# Patient Record
Sex: Male | Born: 1945 | Race: White | Hispanic: No | Marital: Married | Smoking: Never smoker
Health system: Southern US, Community
[De-identification: ages and names within clinical notes are randomized; demographics above are authoritative.]

## PROBLEM LIST (undated history)

## (undated) DIAGNOSIS — I10 Essential (primary) hypertension: Secondary | ICD-10-CM

## (undated) DIAGNOSIS — E119 Type 2 diabetes mellitus without complications: Secondary | ICD-10-CM

## (undated) DIAGNOSIS — E162 Hypoglycemia, unspecified: Secondary | ICD-10-CM

## (undated) DIAGNOSIS — M48061 Spinal stenosis, lumbar region without neurogenic claudication: Secondary | ICD-10-CM

## (undated) DIAGNOSIS — M4802 Spinal stenosis, cervical region: Secondary | ICD-10-CM

## (undated) DIAGNOSIS — Z87442 Personal history of urinary calculi: Secondary | ICD-10-CM

## (undated) DIAGNOSIS — G250 Essential tremor: Secondary | ICD-10-CM

## (undated) DIAGNOSIS — M66261 Spontaneous rupture of extensor tendons, right lower leg: Secondary | ICD-10-CM

## (undated) DIAGNOSIS — M1990 Unspecified osteoarthritis, unspecified site: Secondary | ICD-10-CM

## (undated) DIAGNOSIS — Z8719 Personal history of other diseases of the digestive system: Secondary | ICD-10-CM

## (undated) DIAGNOSIS — K219 Gastro-esophageal reflux disease without esophagitis: Secondary | ICD-10-CM

## (undated) DIAGNOSIS — G4730 Sleep apnea, unspecified: Secondary | ICD-10-CM

## (undated) HISTORY — PX: VENA CAVA FILTER PLACEMENT: SUR1032

## (undated) HISTORY — PX: SKIN SURGERY: SHX2413

## (undated) HISTORY — PX: GASTRIC BYPASS: SHX52

## (undated) HISTORY — PX: FRACTURE SURGERY: SHX138

---
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
## 2003-11-04 DIAGNOSIS — R0601 Orthopnea: Secondary | ICD-10-CM | POA: Insufficient documentation

## 2003-11-04 DIAGNOSIS — E66813 Obesity, class 3: Secondary | ICD-10-CM | POA: Insufficient documentation

## 2003-11-04 DIAGNOSIS — K279 Peptic ulcer, site unspecified, unspecified as acute or chronic, without hemorrhage or perforation: Secondary | ICD-10-CM | POA: Insufficient documentation

## 2012-03-12 DIAGNOSIS — F419 Anxiety disorder, unspecified: Secondary | ICD-10-CM | POA: Insufficient documentation

## 2012-06-11 DIAGNOSIS — N200 Calculus of kidney: Secondary | ICD-10-CM | POA: Insufficient documentation

## 2012-12-17 DIAGNOSIS — M5126 Other intervertebral disc displacement, lumbar region: Secondary | ICD-10-CM | POA: Insufficient documentation

## 2014-04-14 DIAGNOSIS — M9951 Intervertebral disc stenosis of neural canal of cervical region: Secondary | ICD-10-CM | POA: Insufficient documentation

## 2014-04-14 DIAGNOSIS — M488X9 Other specified spondylopathies, site unspecified: Secondary | ICD-10-CM | POA: Insufficient documentation

## 2018-09-29 DIAGNOSIS — K909 Intestinal malabsorption, unspecified: Secondary | ICD-10-CM | POA: Insufficient documentation

## 2019-02-25 NOTE — Discharge Instructions (Signed)
°  Instructions after Total Knee Replacement ° ° Heleena Miceli P. Domenik Trice, Jr., M.D.    ° Dept. of Orthopaedics & Sports Medicine ° [facility] ° [address] ° [place], [place]  [address] ° Phone: [phone]   Fax: [phone] ° °  °DIET: °• Drink plenty of non-alcoholic fluids. °• Resume your normal diet. Include foods high in fiber. ° °ACTIVITY:  °• You may use crutches or a walker with weight-bearing as tolerated, unless instructed otherwise. °• You may be weaned off of the walker or crutches by your Physical Therapist.  °• Do NOT place pillows under the knee. Anything placed under the knee could limit your ability to straighten the knee.   °• Continue doing gentle exercises. Exercising will reduce the pain and swelling, increase motion, and prevent muscle weakness.   °• Please continue to use the TED compression stockings for 6 weeks. You may remove the stockings at night, but should reapply them in the morning. °• Do not drive or operate any equipment until instructed. ° °WOUND CARE:  °• Continue to use the PolarCare or ice packs periodically to reduce pain and swelling. °• You may bathe or shower after the staples are removed at the first office visit following surgery. ° °MEDICATIONS: °• You may resume your regular medications. °• Please take the pain medication as prescribed on the medication. °• Do not take pain medication on an empty stomach. °• You have been given a prescription for a blood thinner (Lovenox or Coumadin). Please take the medication as instructed. (NOTE: After completing a 2 week course of Lovenox, take one Enteric-coated aspirin once a day. This along with elevation will help reduce the possibility of phlebitis in your operated leg.) °• Do not drive or drink alcoholic beverages when taking pain medications. ° °CALL THE OFFICE FOR: °• Temperature above 101 degrees °• Excessive bleeding or drainage on the dressing. °• Excessive swelling, coldness, or paleness of the toes. °• Persistent  nausea and vomiting. ° °FOLLOW-UP:  °• You should have an appointment to return to the office in 10-14 days after surgery. °• Arrangements have been made for continuation of Physical Therapy (either home therapy or outpatient therapy). °  °

## 2019-02-26 ENCOUNTER — Other Ambulatory Visit: Payer: Self-pay

## 2019-02-26 ENCOUNTER — Encounter
Admission: RE | Admit: 2019-02-26 | Discharge: 2019-02-26 | Disposition: A | Discharge status: Home / Self Care | Payer: Medicare Other | Source: Ambulatory Visit | Attending: Orthopedic Surgery | Admitting: Orthopedic Surgery

## 2019-02-26 DIAGNOSIS — I10 Essential (primary) hypertension: Secondary | ICD-10-CM | POA: Diagnosis not present

## 2019-02-26 DIAGNOSIS — G4730 Sleep apnea, unspecified: Secondary | ICD-10-CM | POA: Diagnosis not present

## 2019-02-26 DIAGNOSIS — Z20828 Contact with and (suspected) exposure to other viral communicable diseases: Secondary | ICD-10-CM | POA: Insufficient documentation

## 2019-02-26 DIAGNOSIS — Z01818 Encounter for other preprocedural examination: Secondary | ICD-10-CM | POA: Diagnosis present

## 2019-02-26 DIAGNOSIS — Z79899 Other long term (current) drug therapy: Secondary | ICD-10-CM | POA: Diagnosis not present

## 2019-02-26 DIAGNOSIS — M1712 Unilateral primary osteoarthritis, left knee: Secondary | ICD-10-CM | POA: Diagnosis not present

## 2019-02-26 HISTORY — DX: Hypoglycemia, unspecified: E16.2

## 2019-02-26 HISTORY — DX: Type 2 diabetes mellitus without complications: E11.9

## 2019-02-26 HISTORY — DX: Essential (primary) hypertension: I10

## 2019-02-26 HISTORY — DX: Spinal stenosis, lumbar region without neurogenic claudication: M48.061

## 2019-02-26 HISTORY — DX: Essential tremor: G25.0

## 2019-02-26 HISTORY — DX: Spinal stenosis, cervical region: M48.02

## 2019-02-26 HISTORY — DX: Sleep apnea, unspecified: G47.30

## 2019-02-26 HISTORY — DX: Gastro-esophageal reflux disease without esophagitis: K21.9

## 2019-02-26 HISTORY — DX: Unspecified osteoarthritis, unspecified site: M19.90

## 2019-02-26 HISTORY — DX: Personal history of urinary calculi: Z87.442

## 2019-02-26 HISTORY — DX: Personal history of other diseases of the digestive system: Z87.19

## 2019-02-26 LAB — TYPE AND SCREEN
ABO/RH(D): A POS
Antibody Screen: NEGATIVE

## 2019-02-26 LAB — URINALYSIS, ROUTINE W REFLEX MICROSCOPIC
Bilirubin Urine: NEGATIVE
Glucose, UA: NEGATIVE mg/dL
Ketones, ur: NEGATIVE mg/dL
Nitrite: POSITIVE — AB
Protein, ur: NEGATIVE mg/dL
Specific Gravity, Urine: 1.013 (ref 1.005–1.030)
WBC, UA: >50 WBC/hpf — ABNORMAL HIGH (ref 0–5)
pH: 6 (ref 5.0–8.0)

## 2019-02-26 LAB — CBC
HCT: 40.8 % (ref 39.0–52.0)
Hemoglobin: 13.2 g/dL (ref 13.0–17.0)
MCH: 31.7 pg (ref 26.0–34.0)
MCHC: 32.4 g/dL (ref 30.0–36.0)
MCV: 98.1 fL (ref 80.0–100.0)
Platelets: 127 10*3/uL — ABNORMAL LOW (ref 150–400)
RBC: 4.16 MIL/uL — ABNORMAL LOW (ref 4.22–5.81)
RDW: 13.6 % (ref 11.5–15.5)
WBC: 4.3 10*3/uL (ref 4.0–10.5)
nRBC: 0 % (ref 0.0–0.2)

## 2019-02-26 LAB — COMPREHENSIVE METABOLIC PANEL
ALT: 20 U/L (ref 0–44)
AST: 22 U/L (ref 15–41)
Albumin: 3.6 g/dL (ref 3.5–5.0)
Alkaline Phosphatase: 98 U/L (ref 38–126)
Anion gap: 10 (ref 5–15)
BUN: 22 mg/dL (ref 8–23)
CO2: 24 mmol/L (ref 22–32)
Calcium: 8.6 mg/dL — ABNORMAL LOW (ref 8.9–10.3)
Chloride: 108 mmol/L (ref 98–111)
Creatinine, Ser: 0.75 mg/dL (ref 0.61–1.24)
GFR calc Af Amer: >60 mL/min (ref >60)
GFR calc non Af Amer: >60 mL/min (ref >60)
Glucose, Bld: 92 mg/dL (ref 70–99)
Potassium: 4.1 mmol/L (ref 3.5–5.1)
Sodium: 142 mmol/L (ref 135–145)
Total Bilirubin: 0.5 mg/dL (ref 0.3–1.2)
Total Protein: 6.2 g/dL — ABNORMAL LOW (ref 6.5–8.1)

## 2019-02-26 LAB — PROTIME-INR
INR: 1.1 (ref 0.8–1.2)
Prothrombin Time: 14.4 seconds (ref 11.4–15.2)

## 2019-02-26 LAB — SURGICAL PCR SCREEN
MRSA, PCR: NEGATIVE
Staphylococcus aureus: NEGATIVE

## 2019-02-26 LAB — SEDIMENTATION RATE: Sed Rate: 6 mm/hr (ref 0–20)

## 2019-02-26 LAB — C-REACTIVE PROTEIN: CRP: <0.8 mg/dL (ref <1.0)

## 2019-02-26 LAB — APTT: aPTT: 29 seconds (ref 24–36)

## 2019-02-26 NOTE — Patient Instructions (Addendum)
Your procedure is scheduled on: 03-05-19 [facility] Report to Same Day Surgery 2nd floor medical mall [facility] Entrance-take elevator on left to 2nd floor.  Check in with surgery information desk.) To find out your arrival time please call [phone] between 1PM - 3PM on 03-04-19 TUESDAY  Remember: Instructions that are not followed completely may result in serious medical risk, up to and including death, or upon the discretion of your surgeon and anesthesiologist your surgery may need to be rescheduled.    _x___ 1. Do not eat food after midnight the night before your procedure. NO GUM OR CANDY AFTER MIDNIGHT. You may drink WATER up to 2 hours before you are scheduled to arrive at the hospital for your procedure.  Do not drink WATER within 2 hours of your scheduled arrival to the hospital.    ____Ensure clear carbohydrate drink on the way to the hospital for bariatric patients  _X___GATORADE G2 drink 3 hours before surgery.      __x__ 2. No Alcohol for 24 hours before or after surgery.   __x__3. No Smoking or e-cigarettes for 24 prior to surgery.  Do not use any chewable tobacco products for at least 6 hour prior to surgery   ____  4. Bring all medications with you on the day of surgery if instructed.    __x__ 5. Notify your doctor if there is any change in your medical condition     (cold, fever, infections).    x___6. On the morning of surgery brush your teeth with toothpaste and water.  You may rinse your mouth with mouth wash if you wish.  Do not swallow any toothpaste or mouthwash.   Do not wear jewelry, make-up, hairpins, clips or nail polish.  Do not wear lotions, powders, or perfumes. You may wear deodorant.  Do not shave 48 hours prior to surgery. Men may shave face and neck.  Do not bring valuables to the hospital.    [facility] is not responsible for any belongings or valuables.               Contacts, dentures or bridgework may not be worn into surgery.  Leave  your suitcase in the car. After surgery it may be brought to your room.  For patients admitted to the hospital, discharge time is determined by your treatment team.  _  Patients discharged the day of surgery will not be allowed to drive home.  You will need someone to drive you home and stay with you the night of your procedure.    Please read over the following fact sheets that you were given:   [facility] Preparing for Surgery and or MRSA Information   _x___ TAKE THE FOLLOWING MEDICATION THE MORNING OF SURGERY WITH A SMALL SIP OF WATER. These include:  1. ALLOPURINOL   2. KLONOPIN (CLONAZEPAM)  3. FLEXERIL (CYCLOBENAZEPRINE)  4. PROSCAR (FINASTERIDE)  5. PROPRANOLOL (INDERAL)  6. TOPAMAX (TOPIRAMATE)  7. MYSOLINE (PRIMIDONE)  8. PRILOSEC (OMEPRAZOLE)  9. TAKE AN EXTRA PRILOSEC (OMEPRAZOLE) THE NIGHT BEFORE YOUR SURGERY  ____Fleets enema or Magnesium Citrate as directed.   _x___ Use CHG Soap or sage wipes as directed on instruction sheet   ____ Use inhalers on the day of surgery and bring to hospital day of surgery  _X___ Stop Metformin 2 days prior to surgery-LAST DOSE ON Sunday, AUGUST 9TH  ____ Take 1/2 of usual insulin dose the night before surgery and none on the morning surgery.   ____ Follow recommendations  from Cardiologist, Pulmonologist or PCP regarding  stopping Aspirin, Coumadin, Plavix ,Eliquis, Effient, or Pradaxa, and Pletal.  X____Stop Anti-inflammatories such as Advil, Aleve, Ibuprofen, Motrin, Naproxen,MELOXICAM (MOBIC) Naprosyn, Goodies powders or aspirin products NOW-OK to take Tylenol    ____ Stop supplements until after surgery.     ____ Bring C-Pap to the hospital.

## 2019-02-27 NOTE — Pre-Procedure Instructions (Addendum)
CALLED DR YEBXIDH REGARDING EKG THAT SHOWED INCOMPLETE LBBB. NO EKGS FOR COMPARISON SO UNSURE IF THIS IS NEW. DR Nassau. CALLED TIFFANY AT DR HOOTENS OFFICE AND NOTIFIED HER OF THIS. FAXED TO DR Maree Krabbe OFFICE AND DR SEEPE'S OFFICE

## 2019-02-28 ENCOUNTER — Other Ambulatory Visit
Admission: RE | Admit: 2019-02-28 | Discharge: 2019-02-28 | Disposition: A | Discharge status: Home / Self Care | Payer: Medicare Other | Source: Ambulatory Visit | Attending: Orthopedic Surgery | Admitting: Orthopedic Surgery

## 2019-02-28 ENCOUNTER — Other Ambulatory Visit: Payer: Self-pay

## 2019-02-28 DIAGNOSIS — Z01818 Encounter for other preprocedural examination: Secondary | ICD-10-CM | POA: Diagnosis not present

## 2019-02-28 LAB — SARS CORONAVIRUS 2 (TAT 6-24 HRS): SARS Coronavirus 2: NEGATIVE

## 2019-03-01 LAB — URINE CULTURE
Culture: >=100000 — AB
Special Requests: NORMAL

## 2019-03-03 NOTE — Op Note (Signed)
OPERATIVE NOTE  DATE OF SURGERY:  03/05/2019  PATIENT NAME:  Curtis Alexander   DOB: 1945/07/30  MRN: [id number]  PRE-OPERATIVE DIAGNOSIS: Degenerative arthrosis of the left knee, primary  POST-OPERATIVE DIAGNOSIS:  Same  PROCEDURE:  Left total knee arthroplasty using computer-assisted navigation  SURGEON:  Marciano Sequin. M.D.  ASSISTANT:  Benjaman Lobe, RN (present and scrubbed throughout the case, critical for assistance with exposure, retraction, instrumentation, and closure)  ANESTHESIA: spinal  ESTIMATED BLOOD LOSS: 100 mL  FLUIDS REPLACED: 1500 mL of crystalloid  TOURNIQUET TIME: 106 minutes  DRAINS: 2 medium Hemovac drains  SOFT TISSUE RELEASES: Anterior cruciate ligament, posterior cruciate ligament, deep medial collateral ligament, patellofemoral ligament  IMPLANTS UTILIZED: DePuy Attune size 7 posterior stabilized femoral component (cemented), size 8 rotating platform tibial component (cemented), 41 mm medialized dome patella (cemented), and a 5 mm stabilized rotating platform polyethylene insert.  INDICATIONS FOR SURGERY: Curtis Alexander is a 73 y.o. year old male with a long history of progressive knee pain. X-rays demonstrated severe degenerative changes in tricompartmental fashion. The patient had not seen any significant improvement despite conservative nonsurgical intervention. After discussion of the risks and benefits of surgical intervention, the patient expressed understanding of the risks benefits and agree with plans for total knee arthroplasty.   The risks, benefits, and alternatives were discussed at length including but not limited to the risks of infection, bleeding, nerve injury, stiffness, blood clots, the need for revision surgery, cardiopulmonary complications, among others, and they were willing to proceed.  PROCEDURE IN DETAIL: The patient was brought into the operating room and, after adequate spinal anesthesia was achieved, a tourniquet was  placed on the patient's upper thigh. The patient's knee and leg were cleaned and prepped with alcohol and DuraPrep and draped in the usual sterile fashion. A "timeout" was performed as per usual protocol. The lower extremity was exsanguinated using an Esmarch, and the tourniquet was inflated to 300 mmHg. An anterior longitudinal incision was made followed by a standard mid vastus approach. The deep fibers of the medial collateral ligament were elevated in a subperiosteal fashion off of the medial flare of the tibia so as to maintain a continuous soft tissue sleeve. The patella was subluxed laterally and the patellofemoral ligament was incised. Inspection of the knee demonstrated severe degenerative changes with full-thickness loss of articular cartilage. Osteophytes were debrided using a rongeur. Anterior and posterior cruciate ligaments were excised. Two 4.0 mm Schanz pins were inserted in the femur and into the tibia for attachment of the array of trackers used for computer-assisted navigation. Hip center was identified using a circumduction technique. Distal landmarks were mapped using the computer. The distal femur and proximal tibia were mapped using the computer. The distal femoral cutting guide was positioned using computer-assisted navigation so as to achieve a 5 distal valgus cut. The femur was sized and it was felt that a size 7 femoral component was appropriate. A size 7 femoral cutting guide was positioned and the anterior cut was performed and verified using the computer. This was followed by completion of the posterior and chamfer cuts. Femoral cutting guide for the central box was then positioned in the center box cut was performed.  Attention was then directed to the proximal tibia. Medial and lateral menisci were excised. The extramedullary tibial cutting guide was positioned using computer-assisted navigation so as to achieve a 0 varus-valgus alignment and 3 posterior slope. The cut was  performed and verified using the computer. The proximal tibia was sized  and it was felt that a size 8 tibial tray was appropriate. Tibial and femoral trials were inserted followed by insertion of a 5 mm polyethylene insert.This allowed for excellent mediolateral soft tissue balancing both in flexion and in full extension. Finally, the patella was cut and prepared so as to accommodate a 41 mm medialized dome patella. A patella trial was placed and the knee was placed through a range of motion with excellent patellar tracking appreciated. The femoral trial was removed after debridement of posterior osteophytes. The central post-hole for the tibial component was reamed followed by insertion of a keel punch. Tibial trials were then removed. Cut surfaces of bone were irrigated with copious amounts of normal saline with antibiotic solution using pulsatile lavage and then suctioned dry. Polymethylmethacrylate cement was prepared in the usual fashion using a vacuum mixer. Cement was applied to the cut surface of the proximal tibia as well as along the undersurface of a size 8 rotating platform tibial component. Tibial component was positioned and impacted into place. Excess cement was removed using [employer] fast streamer. Cement was then applied to the cut surfaces of the femur as well as along the posterior flanges of the size 7 femoral component. The femoral component was positioned and impacted into place. Excess cement was removed using [employer] fast streamer. A 5 mm polyethylene trial was inserted and the knee was brought into full extension with steady axial compression applied. Finally, cement was applied to the backside of a 41 mm medialized dome patella and the patellar component was positioned and patellar clamp applied. Excess cement was removed using [employer] fast streamer. After adequate curing of the cement, the tourniquet was deflated after a total tourniquet time of 106 minutes. Hemostasis was achieved using electrocautery. The  knee was irrigated with copious amounts of normal saline with antibiotic solution using pulsatile lavage and then suctioned dry. 20 mL of 1.3% Exparel and 60 mL of 0.25% Marcaine in 40 mL of normal saline was injected along the posterior capsule, medial and lateral gutters, and along the arthrotomy site. A 5 mm stabilized rotating platform polyethylene insert was inserted and the knee was placed through a range of motion with excellent mediolateral soft tissue balancing appreciated and excellent patellar tracking noted. 2 medium drains were placed in the wound bed and brought out through separate stab incisions. The medial parapatellar portion of the incision was reapproximated using interrupted sutures of #1 Vicryl. Subcutaneous tissue was approximated in layers using first #0 Vicryl followed #2-0 Vicryl. The skin was approximated with skin staples. A sterile dressing was applied.  The patient tolerated the procedure well and was transported to the recovery room in stable condition.    [name]., M.D.

## 2019-03-04 ENCOUNTER — Encounter: Payer: Self-pay | Admitting: Orthopedic Surgery

## 2019-03-04 DIAGNOSIS — M109 Gout, unspecified: Secondary | ICD-10-CM | POA: Insufficient documentation

## 2019-03-04 DIAGNOSIS — G250 Essential tremor: Secondary | ICD-10-CM | POA: Insufficient documentation

## 2019-03-04 NOTE — Pre-Procedure Instructions (Signed)
CARDIAC CLEARANCE ON CHART FROM DR Janit Bern RISK

## 2019-03-05 ENCOUNTER — Encounter: Payer: Self-pay | Admitting: *Deleted

## 2019-03-05 ENCOUNTER — Other Ambulatory Visit: Payer: Self-pay

## 2019-03-05 ENCOUNTER — Inpatient Hospital Stay: Payer: Medicare Other

## 2019-03-05 ENCOUNTER — Inpatient Hospital Stay: Payer: Medicare Other | Admitting: Anesthesiology

## 2019-03-05 ENCOUNTER — Encounter
Admission: RE | Disposition: A | Discharge status: Skilled Nursing Facility | Payer: Self-pay | Source: Home / Self Care | Attending: Orthopedic Surgery

## 2019-03-05 ENCOUNTER — Inpatient Hospital Stay
Admission: RE | Admit: 2019-03-05 | Discharge: 2019-03-11 | DRG: 470 | Disposition: A | Discharge status: Skilled Nursing Facility | Payer: Medicare Other | Attending: Orthopedic Surgery | Admitting: Orthopedic Surgery

## 2019-03-05 DIAGNOSIS — Z1612 Extended spectrum beta lactamase (ESBL) resistance: Secondary | ICD-10-CM | POA: Diagnosis present

## 2019-03-05 DIAGNOSIS — Z7982 Long term (current) use of aspirin: Secondary | ICD-10-CM

## 2019-03-05 DIAGNOSIS — D539 Nutritional anemia, unspecified: Secondary | ICD-10-CM | POA: Diagnosis present

## 2019-03-05 DIAGNOSIS — E119 Type 2 diabetes mellitus without complications: Secondary | ICD-10-CM | POA: Diagnosis present

## 2019-03-05 DIAGNOSIS — Z7984 Long term (current) use of oral hypoglycemic drugs: Secondary | ICD-10-CM

## 2019-03-05 DIAGNOSIS — Z791 Long term (current) use of non-steroidal anti-inflammatories (NSAID): Secondary | ICD-10-CM

## 2019-03-05 DIAGNOSIS — I9581 Postprocedural hypotension: Secondary | ICD-10-CM | POA: Diagnosis not present

## 2019-03-05 DIAGNOSIS — B9629 Other Escherichia coli [E. coli] as the cause of diseases classified elsewhere: Secondary | ICD-10-CM | POA: Diagnosis not present

## 2019-03-05 DIAGNOSIS — G4733 Obstructive sleep apnea (adult) (pediatric): Secondary | ICD-10-CM | POA: Diagnosis present

## 2019-03-05 DIAGNOSIS — Z818 Family history of other mental and behavioral disorders: Secondary | ICD-10-CM

## 2019-03-05 DIAGNOSIS — Z87891 Personal history of nicotine dependence: Secondary | ICD-10-CM

## 2019-03-05 DIAGNOSIS — M4802 Spinal stenosis, cervical region: Secondary | ICD-10-CM | POA: Diagnosis present

## 2019-03-05 DIAGNOSIS — N390 Urinary tract infection, site not specified: Secondary | ICD-10-CM | POA: Diagnosis present

## 2019-03-05 DIAGNOSIS — R8281 Pyuria: Secondary | ICD-10-CM | POA: Diagnosis not present

## 2019-03-05 DIAGNOSIS — Z6841 Body Mass Index (BMI) 40.0 and over, adult: Secondary | ICD-10-CM

## 2019-03-05 DIAGNOSIS — M1711 Unilateral primary osteoarthritis, right knee: Secondary | ICD-10-CM | POA: Diagnosis present

## 2019-03-05 DIAGNOSIS — Z8719 Personal history of other diseases of the digestive system: Secondary | ICD-10-CM | POA: Diagnosis not present

## 2019-03-05 DIAGNOSIS — Z9884 Bariatric surgery status: Secondary | ICD-10-CM | POA: Diagnosis not present

## 2019-03-05 DIAGNOSIS — D649 Anemia, unspecified: Secondary | ICD-10-CM | POA: Diagnosis not present

## 2019-03-05 DIAGNOSIS — I959 Hypotension, unspecified: Secondary | ICD-10-CM | POA: Diagnosis not present

## 2019-03-05 DIAGNOSIS — N35919 Unspecified urethral stricture, male, unspecified site: Secondary | ICD-10-CM | POA: Diagnosis present

## 2019-03-05 DIAGNOSIS — B9620 Unspecified Escherichia coli [E. coli] as the cause of diseases classified elsewhere: Secondary | ICD-10-CM | POA: Diagnosis present

## 2019-03-05 DIAGNOSIS — R251 Tremor, unspecified: Secondary | ICD-10-CM | POA: Diagnosis not present

## 2019-03-05 DIAGNOSIS — R0600 Dyspnea, unspecified: Secondary | ICD-10-CM

## 2019-03-05 DIAGNOSIS — Z8249 Family history of ischemic heart disease and other diseases of the circulatory system: Secondary | ICD-10-CM

## 2019-03-05 DIAGNOSIS — Z79899 Other long term (current) drug therapy: Secondary | ICD-10-CM | POA: Diagnosis not present

## 2019-03-05 DIAGNOSIS — Z87442 Personal history of urinary calculi: Secondary | ICD-10-CM | POA: Diagnosis not present

## 2019-03-05 DIAGNOSIS — G250 Essential tremor: Secondary | ICD-10-CM | POA: Diagnosis present

## 2019-03-05 DIAGNOSIS — N400 Enlarged prostate without lower urinary tract symptoms: Secondary | ICD-10-CM | POA: Diagnosis present

## 2019-03-05 DIAGNOSIS — Z96659 Presence of unspecified artificial knee joint: Secondary | ICD-10-CM

## 2019-03-05 DIAGNOSIS — E860 Dehydration: Secondary | ICD-10-CM | POA: Diagnosis present

## 2019-03-05 DIAGNOSIS — K219 Gastro-esophageal reflux disease without esophagitis: Secondary | ICD-10-CM | POA: Diagnosis present

## 2019-03-05 DIAGNOSIS — Z833 Family history of diabetes mellitus: Secondary | ICD-10-CM

## 2019-03-05 DIAGNOSIS — Z960 Presence of urogenital implants: Secondary | ICD-10-CM | POA: Diagnosis not present

## 2019-03-05 DIAGNOSIS — D696 Thrombocytopenia, unspecified: Secondary | ICD-10-CM | POA: Diagnosis present

## 2019-03-05 DIAGNOSIS — R571 Hypovolemic shock: Secondary | ICD-10-CM | POA: Diagnosis not present

## 2019-03-05 DIAGNOSIS — M48061 Spinal stenosis, lumbar region without neurogenic claudication: Secondary | ICD-10-CM | POA: Diagnosis present

## 2019-03-05 DIAGNOSIS — I10 Essential (primary) hypertension: Secondary | ICD-10-CM | POA: Diagnosis present

## 2019-03-05 DIAGNOSIS — E861 Hypovolemia: Secondary | ICD-10-CM | POA: Diagnosis not present

## 2019-03-05 DIAGNOSIS — Z96651 Presence of right artificial knee joint: Secondary | ICD-10-CM | POA: Diagnosis not present

## 2019-03-05 HISTORY — PX: KNEE ARTHROPLASTY: SHX992

## 2019-03-05 LAB — HEMOGLOBIN A1C
Hgb A1c MFr Bld: 4.8 % (ref 4.8–5.6)
Mean Plasma Glucose: 91.06 mg/dL

## 2019-03-05 LAB — ABO/RH: ABO/RH(D): A POS

## 2019-03-05 LAB — GLUCOSE, CAPILLARY
Glucose-Capillary: 99 mg/dL (ref 70–99)
Glucose-Capillary: 109 mg/dL — ABNORMAL HIGH (ref 70–99)

## 2019-03-05 SURGERY — ARTHROPLASTY, KNEE, TOTAL, USING IMAGELESS COMPUTER-ASSISTED NAVIGATION
Anesthesia: Spinal | Site: Knee | Laterality: Right

## 2019-03-05 MED ORDER — ONDANSETRON HCL 4 MG/2ML IJ SOLN
INTRAMUSCULAR | Status: DC | PRN
  Administered 2019-03-05: 4 mg via INTRAVENOUS

## 2019-03-05 MED ORDER — VITAMIN D3 25 MCG (1000 UNIT) PO TABS
2000.0000 [IU] | ORAL_TABLET | Freq: Every day | ORAL | Status: DC
  Administered 2019-03-06 – 2019-03-11 (×6): 2000 [IU] via ORAL
  Filled 2019-03-05 (×12): qty 2

## 2019-03-05 MED ORDER — SODIUM CHLORIDE 0.9 % IV SOLN
INTRAVENOUS | Status: DC
  Administered 2019-03-05 (×2): via INTRAVENOUS

## 2019-03-05 MED ORDER — LORATADINE 10 MG PO TABS: 10.0000 mg | ORAL_TABLET | Freq: Every day | ORAL | Status: DC | PRN

## 2019-03-05 MED ORDER — PANTOPRAZOLE SODIUM 40 MG PO TBEC
40.0000 mg | DELAYED_RELEASE_TABLET | Freq: Two times a day (BID) | ORAL | Status: DC
  Administered 2019-03-05 – 2019-03-07 (×4): 40 mg via ORAL
  Filled 2019-03-05 (×4): qty 1

## 2019-03-05 MED ORDER — DIPHENHYDRAMINE HCL 12.5 MG/5ML PO ELIX
12.5000 mg | ORAL_SOLUTION | ORAL | Status: DC | PRN
  Filled 2019-03-05: qty 10

## 2019-03-05 MED ORDER — FENTANYL CITRATE (PF) 100 MCG/2ML IJ SOLN
25.0000 ug | INTRAMUSCULAR | Status: DC | PRN
  Administered 2019-03-05 (×2): 25 ug via INTRAVENOUS

## 2019-03-05 MED ORDER — TRANEXAMIC ACID-NACL 1000-0.7 MG/100ML-% IV SOLN
1000.0000 mg | INTRAVENOUS | Status: AC
  Administered 2019-03-05: 12:00:00 1000 mg via INTRAVENOUS
  Filled 2019-03-05: qty 100

## 2019-03-05 MED ORDER — VITAMIN B-12 1000 MCG PO TABS
2500.0000 ug | ORAL_TABLET | Freq: Every day | ORAL | Status: DC
  Administered 2019-03-06 – 2019-03-11 (×6): 2500 ug via ORAL
  Filled 2019-03-05 (×6): qty 3

## 2019-03-05 MED ORDER — SODIUM CHLORIDE 0.9 % IV SOLN
INTRAVENOUS | Status: DC | PRN
  Administered 2019-03-05: 16:00:00 60 mL

## 2019-03-05 MED ORDER — OXYCODONE HCL 5 MG PO TABS
10.0000 mg | ORAL_TABLET | ORAL | Status: DC | PRN
  Filled 2019-03-05 (×2): qty 2

## 2019-03-05 MED ORDER — FENTANYL CITRATE (PF) 100 MCG/2ML IJ SOLN
INTRAMUSCULAR | Status: AC
  Filled 2019-03-05: qty 2

## 2019-03-05 MED ORDER — ACETAMINOPHEN 10 MG/ML IV SOLN
1000.0000 mg | Freq: Four times a day (QID) | INTRAVENOUS | Status: AC
  Administered 2019-03-05 – 2019-03-06 (×4): 1000 mg via INTRAVENOUS
  Filled 2019-03-05 (×4): qty 100

## 2019-03-05 MED ORDER — GABAPENTIN 300 MG PO CAPS
ORAL_CAPSULE | ORAL | Status: AC
  Administered 2019-03-05: 10:00:00 300 mg via ORAL
  Filled 2019-03-05: qty 1

## 2019-03-05 MED ORDER — HYDROMORPHONE HCL 1 MG/ML IJ SOLN: 0.5000 mg | INTRAMUSCULAR | Status: DC | PRN

## 2019-03-05 MED ORDER — SODIUM CHLORIDE 0.9 % IV SOLN
INTRAVENOUS | Status: DC
  Administered 2019-03-05: 19:00:00 via INTRAVENOUS

## 2019-03-05 MED ORDER — GABAPENTIN 300 MG PO CAPS
300.0000 mg | ORAL_CAPSULE | Freq: Every day | ORAL | Status: DC
  Administered 2019-03-05 – 2019-03-10 (×6): 300 mg via ORAL
  Filled 2019-03-05 (×6): qty 1

## 2019-03-05 MED ORDER — PHENYLEPHRINE HCL (PRESSORS) 10 MG/ML IV SOLN
INTRAVENOUS | Status: DC | PRN
  Administered 2019-03-05: 100 ug via INTRAVENOUS
  Administered 2019-03-05: 200 ug via INTRAVENOUS

## 2019-03-05 MED ORDER — PROPOFOL 500 MG/50ML IV EMUL
INTRAVENOUS | Status: AC
  Filled 2019-03-05: qty 50

## 2019-03-05 MED ORDER — MENTHOL 3 MG MT LOZG
1.0000 | LOZENGE | OROMUCOSAL | Status: DC | PRN
  Filled 2019-03-05: qty 9

## 2019-03-05 MED ORDER — CEFAZOLIN SODIUM-DEXTROSE 2-4 GM/100ML-% IV SOLN
2.0000 g | Freq: Four times a day (QID) | INTRAVENOUS | Status: DC
  Administered 2019-03-05 – 2019-03-06 (×2): 2 g via INTRAVENOUS
  Filled 2019-03-05 (×4): qty 100

## 2019-03-05 MED ORDER — CYCLOBENZAPRINE HCL 10 MG PO TABS
10.0000 mg | ORAL_TABLET | Freq: Three times a day (TID) | ORAL | Status: DC
  Administered 2019-03-05 – 2019-03-11 (×17): 10 mg via ORAL
  Filled 2019-03-05 (×17): qty 1

## 2019-03-05 MED ORDER — CHLORHEXIDINE GLUCONATE 4 % EX LIQD: 60.0000 mL | Freq: Once | CUTANEOUS | Status: DC

## 2019-03-05 MED ORDER — GABAPENTIN 300 MG PO CAPS
300.0000 mg | ORAL_CAPSULE | Freq: Once | ORAL | Status: AC
  Administered 2019-03-05: 300 mg via ORAL

## 2019-03-05 MED ORDER — ENSURE PRE-SURGERY PO LIQD
296.0000 mL | Freq: Once | ORAL | Status: DC
  Filled 2019-03-05: qty 296

## 2019-03-05 MED ORDER — TAMSULOSIN HCL 0.4 MG PO CAPS
0.4000 mg | ORAL_CAPSULE | Freq: Every day | ORAL | Status: DC
  Administered 2019-03-05 – 2019-03-10 (×6): 0.4 mg via ORAL
  Filled 2019-03-05 (×6): qty 1

## 2019-03-05 MED ORDER — ENOXAPARIN SODIUM 30 MG/0.3ML ~~LOC~~ SOLN
30.0000 mg | Freq: Two times a day (BID) | SUBCUTANEOUS | Status: DC
  Administered 2019-03-06: 08:00:00 30 mg via SUBCUTANEOUS
  Filled 2019-03-05: qty 0.3

## 2019-03-05 MED ORDER — BUPIVACAINE HCL (PF) 0.25 % IJ SOLN
INTRAMUSCULAR | Status: DC | PRN
  Administered 2019-03-05: 60 mL

## 2019-03-05 MED ORDER — SODIUM CHLORIDE FLUSH 0.9 % IV SOLN
INTRAVENOUS | Status: AC
  Filled 2019-03-05: qty 40

## 2019-03-05 MED ORDER — TOPIRAMATE 25 MG PO TABS: 50.0000 mg | ORAL_TABLET | ORAL | Status: DC

## 2019-03-05 MED ORDER — LATANOPROST 0.005 % OP SOLN
1.0000 [drp] | Freq: Every day | OPHTHALMIC | Status: DC
  Administered 2019-03-05 – 2019-03-10 (×6): 1 [drp] via OPHTHALMIC
  Filled 2019-03-05: qty 2.5

## 2019-03-05 MED ORDER — FENTANYL CITRATE (PF) 100 MCG/2ML IJ SOLN
INTRAMUSCULAR | Status: DC | PRN
  Administered 2019-03-05: 25 ug via INTRAVENOUS
  Administered 2019-03-05: 50 ug via INTRAVENOUS
  Administered 2019-03-05: 25 ug via INTRAVENOUS

## 2019-03-05 MED ORDER — BUPIVACAINE HCL (PF) 0.5 % IJ SOLN
INTRAMUSCULAR | Status: DC | PRN
  Administered 2019-03-05: 2.5 mL

## 2019-03-05 MED ORDER — VITAMIN C 500 MG PO TABS
1000.0000 mg | ORAL_TABLET | Freq: Every day | ORAL | Status: DC
  Administered 2019-03-06 – 2019-03-11 (×6): 1000 mg via ORAL
  Filled 2019-03-05 (×6): qty 2

## 2019-03-05 MED ORDER — ACETAMINOPHEN 325 MG PO TABS: 325.0000 mg | ORAL_TABLET | Freq: Four times a day (QID) | ORAL | Status: DC | PRN

## 2019-03-05 MED ORDER — BUPIVACAINE LIPOSOME 1.3 % IJ SUSP
INTRAMUSCULAR | Status: AC
  Filled 2019-03-05: qty 20

## 2019-03-05 MED ORDER — PROPOFOL 10 MG/ML IV BOLUS
INTRAVENOUS | Status: DC | PRN
  Administered 2019-03-05 (×2): 24 mg via INTRAVENOUS

## 2019-03-05 MED ORDER — METOCLOPRAMIDE HCL 10 MG PO TABS
10.0000 mg | ORAL_TABLET | Freq: Three times a day (TID) | ORAL | Status: DC
  Administered 2019-03-05 – 2019-03-07 (×5): 10 mg via ORAL
  Filled 2019-03-05 (×8): qty 1

## 2019-03-05 MED ORDER — FERROUS SULFATE 325 (65 FE) MG PO TABS
325.0000 mg | ORAL_TABLET | Freq: Two times a day (BID) | ORAL | Status: DC
  Administered 2019-03-06 – 2019-03-11 (×10): 325 mg via ORAL
  Filled 2019-03-05 (×11): qty 1

## 2019-03-05 MED ORDER — ONDANSETRON HCL 4 MG/2ML IJ SOLN
INTRAMUSCULAR | Status: AC
  Filled 2019-03-05: qty 2

## 2019-03-05 MED ORDER — PRIMIDONE 50 MG PO TABS
50.0000 mg | ORAL_TABLET | Freq: Two times a day (BID) | ORAL | Status: DC
  Administered 2019-03-05 – 2019-03-11 (×11): 50 mg via ORAL
  Filled 2019-03-05 (×15): qty 1

## 2019-03-05 MED ORDER — TOPIRAMATE 25 MG PO TABS
50.0000 mg | ORAL_TABLET | Freq: Every day | ORAL | Status: DC
  Administered 2019-03-06 – 2019-03-10 (×4): 50 mg via ORAL
  Filled 2019-03-05 (×6): qty 2

## 2019-03-05 MED ORDER — SODIUM CHLORIDE 0.9 % IV BOLUS
500.0000 mL | Freq: Once | INTRAVENOUS | Status: AC
  Administered 2019-03-05: 500 mL via INTRAVENOUS

## 2019-03-05 MED ORDER — BUPIVACAINE HCL (PF) 0.25 % IJ SOLN
INTRAMUSCULAR | Status: AC
  Filled 2019-03-05: qty 60

## 2019-03-05 MED ORDER — NEOMYCIN-POLYMYXIN B GU 40-200000 IR SOLN
Status: AC
  Filled 2019-03-05: qty 20

## 2019-03-05 MED ORDER — CEFAZOLIN SODIUM 1 G IJ SOLR
INTRAMUSCULAR | Status: AC
  Filled 2019-03-05: qty 20

## 2019-03-05 MED ORDER — ONDANSETRON HCL 4 MG/2ML IJ SOLN: 4.0000 mg | Freq: Four times a day (QID) | INTRAMUSCULAR | Status: DC | PRN

## 2019-03-05 MED ORDER — PHENOL 1.4 % MT LIQD
1.0000 | OROMUCOSAL | Status: DC | PRN
  Filled 2019-03-05: qty 177

## 2019-03-05 MED ORDER — MIRTAZAPINE 15 MG PO TABS
45.0000 mg | ORAL_TABLET | Freq: Every day | ORAL | Status: DC
  Administered 2019-03-05 – 2019-03-10 (×6): 45 mg via ORAL
  Filled 2019-03-05 (×6): qty 3

## 2019-03-05 MED ORDER — ACETAMINOPHEN 10 MG/ML IV SOLN
INTRAVENOUS | Status: AC
  Filled 2019-03-05: qty 100

## 2019-03-05 MED ORDER — CLONAZEPAM 0.5 MG PO TABS
0.5000 mg | ORAL_TABLET | Freq: Two times a day (BID) | ORAL | Status: DC
  Administered 2019-03-05 – 2019-03-11 (×12): 0.5 mg via ORAL
  Filled 2019-03-05 (×12): qty 1

## 2019-03-05 MED ORDER — SENNOSIDES-DOCUSATE SODIUM 8.6-50 MG PO TABS
1.0000 | ORAL_TABLET | Freq: Two times a day (BID) | ORAL | Status: DC
  Administered 2019-03-05 – 2019-03-11 (×11): 1 via ORAL
  Filled 2019-03-05 (×12): qty 1

## 2019-03-05 MED ORDER — METOCLOPRAMIDE HCL 10 MG PO TABS
5.0000 mg | ORAL_TABLET | Freq: Three times a day (TID) | ORAL | Status: DC | PRN
  Filled 2019-03-05: qty 1

## 2019-03-05 MED ORDER — TRANEXAMIC ACID-NACL 1000-0.7 MG/100ML-% IV SOLN
1000.0000 mg | Freq: Once | INTRAVENOUS | Status: AC
  Administered 2019-03-05: 1000 mg via INTRAVENOUS
  Filled 2019-03-05: qty 100

## 2019-03-05 MED ORDER — TOPIRAMATE 100 MG PO TABS
100.0000 mg | ORAL_TABLET | Freq: Every day | ORAL | Status: DC
  Administered 2019-03-07 – 2019-03-11 (×5): 100 mg via ORAL
  Filled 2019-03-05 (×7): qty 1

## 2019-03-05 MED ORDER — OXYCODONE HCL 5 MG PO TABS
5.0000 mg | ORAL_TABLET | ORAL | Status: DC | PRN
  Administered 2019-03-05 – 2019-03-11 (×6): 5 mg via ORAL
  Filled 2019-03-05 (×6): qty 1

## 2019-03-05 MED ORDER — SODIUM CHLORIDE 0.9 % IV SOLN
INTRAVENOUS | Status: DC | PRN
  Administered 2019-03-05: 12:00:00 30 ug/min via INTRAVENOUS

## 2019-03-05 MED ORDER — ALUM & MAG HYDROXIDE-SIMETH 200-200-20 MG/5ML PO SUSP: 30.0000 mL | ORAL | Status: DC | PRN

## 2019-03-05 MED ORDER — ERYTHROMYCIN 5 MG/GM OP OINT
1.0000 "application " | TOPICAL_OINTMENT | Freq: Every evening | OPHTHALMIC | Status: DC | PRN
  Filled 2019-03-05: qty 3.5

## 2019-03-05 MED ORDER — ACETAMINOPHEN 10 MG/ML IV SOLN
INTRAVENOUS | Status: DC | PRN
  Administered 2019-03-05: 1000 mg via INTRAVENOUS

## 2019-03-05 MED ORDER — PROPOFOL 10 MG/ML IV BOLUS
INTRAVENOUS | Status: AC
  Filled 2019-03-05: qty 20

## 2019-03-05 MED ORDER — CEFAZOLIN SODIUM-DEXTROSE 2-4 GM/100ML-% IV SOLN
2.0000 g | INTRAVENOUS | Status: AC
  Administered 2019-03-05 (×2): 2 g via INTRAVENOUS

## 2019-03-05 MED ORDER — NEOMYCIN-POLYMYXIN B GU 40-200000 IR SOLN
Status: DC | PRN
  Administered 2019-03-05: 16 mL

## 2019-03-05 MED ORDER — PROPRANOLOL HCL 20 MG PO TABS
40.0000 mg | ORAL_TABLET | Freq: Two times a day (BID) | ORAL | Status: DC
  Administered 2019-03-05: 40 mg via ORAL
  Filled 2019-03-05: qty 1
  Filled 2019-03-05: qty 2
  Filled 2019-03-05: qty 1

## 2019-03-05 MED ORDER — MIDAZOLAM HCL 2 MG/2ML IJ SOLN
INTRAMUSCULAR | Status: AC
  Filled 2019-03-05: qty 2

## 2019-03-05 MED ORDER — MAGNESIUM HYDROXIDE 400 MG/5ML PO SUSP
30.0000 mL | Freq: Every day | ORAL | Status: DC
  Administered 2019-03-06 – 2019-03-11 (×3): 30 mL via ORAL
  Filled 2019-03-05 (×6): qty 30

## 2019-03-05 MED ORDER — BISACODYL 10 MG RE SUPP: 10.0000 mg | Freq: Every day | RECTAL | Status: DC | PRN

## 2019-03-05 MED ORDER — METOCLOPRAMIDE HCL 5 MG/ML IJ SOLN: 5.0000 mg | Freq: Three times a day (TID) | INTRAMUSCULAR | Status: DC | PRN

## 2019-03-05 MED ORDER — ONDANSETRON HCL 4 MG PO TABS: 4.0000 mg | ORAL_TABLET | Freq: Four times a day (QID) | ORAL | Status: DC | PRN

## 2019-03-05 MED ORDER — PHENYLEPHRINE HCL (PRESSORS) 10 MG/ML IV SOLN
INTRAVENOUS | Status: AC
  Filled 2019-03-05: qty 1

## 2019-03-05 MED ORDER — TRAMADOL HCL 50 MG PO TABS
50.0000 mg | ORAL_TABLET | ORAL | Status: DC | PRN
  Administered 2019-03-06 – 2019-03-11 (×10): 100 mg via ORAL
  Filled 2019-03-05 (×10): qty 2

## 2019-03-05 MED ORDER — FINASTERIDE 5 MG PO TABS
5.0000 mg | ORAL_TABLET | Freq: Every day | ORAL | Status: DC
  Administered 2019-03-06 – 2019-03-11 (×5): 5 mg via ORAL
  Filled 2019-03-05 (×5): qty 1

## 2019-03-05 MED ORDER — MIDAZOLAM HCL 5 MG/5ML IJ SOLN
INTRAMUSCULAR | Status: DC | PRN
  Administered 2019-03-05: 1 mg via INTRAVENOUS

## 2019-03-05 MED ORDER — CELECOXIB 200 MG PO CAPS
200.0000 mg | ORAL_CAPSULE | Freq: Two times a day (BID) | ORAL | Status: DC
  Administered 2019-03-05 – 2019-03-11 (×12): 200 mg via ORAL
  Filled 2019-03-05 (×12): qty 1

## 2019-03-05 MED ORDER — TETRACAINE HCL 1 % IJ SOLN
INTRAMUSCULAR | Status: DC | PRN
  Administered 2019-03-05: 5 mg via INTRASPINAL

## 2019-03-05 MED ORDER — PROPOFOL 500 MG/50ML IV EMUL
INTRAVENOUS | Status: DC | PRN
  Administered 2019-03-05: 15:00:00 via INTRAVENOUS
  Administered 2019-03-05: 50 ug/kg/min via INTRAVENOUS

## 2019-03-05 MED ORDER — FLEET ENEMA 7-19 GM/118ML RE ENEM: 1.0000 | ENEMA | Freq: Once | RECTAL | Status: DC | PRN

## 2019-03-05 MED ORDER — DEXAMETHASONE SODIUM PHOSPHATE 10 MG/ML IJ SOLN
8.0000 mg | Freq: Once | INTRAMUSCULAR | Status: AC
  Administered 2019-03-05: 10:00:00 8 mg via INTRAVENOUS

## 2019-03-05 MED ORDER — ZOLPIDEM TARTRATE 5 MG PO TABS
5.0000 mg | ORAL_TABLET | Freq: Every day | ORAL | Status: DC
  Administered 2019-03-10: 5 mg via ORAL
  Filled 2019-03-05 (×8): qty 1

## 2019-03-05 MED ORDER — LIDOCAINE HCL (PF) 2 % IJ SOLN
INTRAMUSCULAR | Status: AC
  Filled 2019-03-05: qty 10

## 2019-03-05 MED ORDER — BRIMONIDINE TARTRATE 0.2 % OP SOLN
1.0000 [drp] | Freq: Two times a day (BID) | OPHTHALMIC | Status: DC
  Administered 2019-03-05 – 2019-03-11 (×12): 1 [drp] via OPHTHALMIC
  Filled 2019-03-05 (×2): qty 5

## 2019-03-05 MED ORDER — INSULIN ASPART 100 UNIT/ML ~~LOC~~ SOLN: 0.0000 [IU] | Freq: Three times a day (TID) | SUBCUTANEOUS | Status: DC

## 2019-03-05 MED ORDER — ALLOPURINOL 300 MG PO TABS
300.0000 mg | ORAL_TABLET | Freq: Every day | ORAL | Status: DC
  Administered 2019-03-06 – 2019-03-11 (×5): 300 mg via ORAL
  Filled 2019-03-05 (×7): qty 1

## 2019-03-05 MED ORDER — DEXAMETHASONE SODIUM PHOSPHATE 10 MG/ML IJ SOLN
INTRAMUSCULAR | Status: AC
  Administered 2019-03-05: 8 mg via INTRAVENOUS
  Filled 2019-03-05: qty 1

## 2019-03-05 MED ORDER — CEFAZOLIN SODIUM-DEXTROSE 2-4 GM/100ML-% IV SOLN
INTRAVENOUS | Status: AC
  Filled 2019-03-05: qty 100

## 2019-03-05 MED ORDER — METFORMIN HCL 500 MG PO TABS
250.0000 mg | ORAL_TABLET | Freq: Two times a day (BID) | ORAL | Status: DC
  Administered 2019-03-05 – 2019-03-06 (×2): 250 mg via ORAL
  Filled 2019-03-05 (×3): qty 1

## 2019-03-05 SURGICAL SUPPLY — 69 items
ATTUNE MED DOME PAT 41 KNEE (Knees) ×2 IMPLANT
ATTUNE MED DOME PAT 41MM KNEE (Knees) ×1 IMPLANT
ATTUNE PS FEM RT SZ 8 CEM KNEE (Femur) ×3 IMPLANT
ATTUNE PSRP INSR SZ8 5 KNEE (Insert) ×2 IMPLANT
ATTUNE PSRP INSR SZ8 5MM KNEE (Insert) ×1 IMPLANT
BATTERY INSTRU NAVIGATION (MISCELLANEOUS) ×12 IMPLANT
BLADE SAW 70X12.5 (BLADE) ×3 IMPLANT
BLADE SAW 90X13X1.19 OSCILLAT (BLADE) ×3 IMPLANT
BLADE SAW 90X25X1.19 OSCILLAT (BLADE) ×3 IMPLANT
BONE CEMENT GENTAMICIN (Cement) ×6 IMPLANT
CANISTER SUCT 3000ML PPV (MISCELLANEOUS) ×3 IMPLANT
CATH COUDE FOLEY 5CC 14FR (CATHETERS) ×3 IMPLANT
CEMENT BONE GENTAMICIN 40 (Cement) ×2 IMPLANT
COOLER POLAR GLACIER W/PUMP (MISCELLANEOUS) ×3 IMPLANT
COVER WAND RF STERILE (DRAPES) ×3 IMPLANT
CUFF TOURN SGL QUICK 34 (TOURNIQUET CUFF) ×2
CUFF TRNQT CYL 34X4.125X (TOURNIQUET CUFF) ×1 IMPLANT
DRAPE 3/4 80X56 (DRAPES) ×3 IMPLANT
DRSG DERMACEA 8X12 NADH (GAUZE/BANDAGES/DRESSINGS) ×3 IMPLANT
DRSG OPSITE POSTOP 4X14 (GAUZE/BANDAGES/DRESSINGS) ×3 IMPLANT
DRSG TEGADERM 4X4.75 (GAUZE/BANDAGES/DRESSINGS) ×3 IMPLANT
DURAPREP 26ML APPLICATOR (WOUND CARE) ×6 IMPLANT
ELECT REM PT RETURN 9FT ADLT (ELECTROSURGICAL) ×3
ELECTRODE REM PT RTRN 9FT ADLT (ELECTROSURGICAL) ×1 IMPLANT
EX-PIN ORTHOLOCK NAV 4X150 (PIN) ×6 IMPLANT
GLOVE BIOGEL M STRL SZ7.5 (GLOVE) ×6 IMPLANT
GLOVE INDICATOR 8.0 STRL GRN (GLOVE) ×3 IMPLANT
GOWN STRL REUS W/ TWL LRG LVL3 (GOWN DISPOSABLE) ×2 IMPLANT
GOWN STRL REUS W/TWL LRG LVL3 (GOWN DISPOSABLE) ×4
HEMOVAC 400CC 10FR (MISCELLANEOUS) ×3 IMPLANT
HOLDER FOLEY CATH W/STRAP (MISCELLANEOUS) ×3 IMPLANT
HOOD PEEL AWAY FLYTE STAYCOOL (MISCELLANEOUS) ×6 IMPLANT
KIT TURNOVER KIT A (KITS) ×3 IMPLANT
KNIFE SCULPS 14X20 (INSTRUMENTS) ×3 IMPLANT
LABEL OR SOLS (LABEL) ×3 IMPLANT
MANIFOLD NEPTUNE II (INSTRUMENTS) ×3 IMPLANT
NDL SAFETY ECLIPSE 18X1.5 (NEEDLE) ×1 IMPLANT
NEEDLE HYPO 18GX1.5 SHARP (NEEDLE) ×2
NEEDLE SPNL 20GX3.5 QUINCKE YW (NEEDLE) ×6 IMPLANT
NS IRRIG 500ML POUR BTL (IV SOLUTION) ×3 IMPLANT
PACK TOTAL KNEE (MISCELLANEOUS) ×3 IMPLANT
PAD ABD DERMACEA PRESS 5X9 (GAUZE/BANDAGES/DRESSINGS) ×3 IMPLANT
PAD WRAPON POLAR KNEE (MISCELLANEOUS) ×1 IMPLANT
PENCIL SMOKE ULTRAEVAC 22 CON (MISCELLANEOUS) ×3 IMPLANT
PIN DRILL QUICK PACK ×3 IMPLANT
PIN FIXATION 1/8DIA X 3INL (PIN) ×9 IMPLANT
PULSAVAC PLUS IRRIG FAN TIP (DISPOSABLE) ×3
SOL .9 NS 3000ML IRR  AL (IV SOLUTION) ×2
SOL .9 NS 3000ML IRR UROMATIC (IV SOLUTION) ×1 IMPLANT
SOL PREP PVP 2OZ (MISCELLANEOUS) ×3
SOLUTION PREP PVP 2OZ (MISCELLANEOUS) ×1 IMPLANT
SPONGE DRAIN TRACH 4X4 STRL 2S (GAUZE/BANDAGES/DRESSINGS) ×3 IMPLANT
SPONGE LAP 18X18 RF (DISPOSABLE) ×3 IMPLANT
STAPLER SKIN PROX 35W (STAPLE) ×3 IMPLANT
STOCKINETTE IMPERV 14X48 (MISCELLANEOUS) IMPLANT
STRAP TIBIA SHORT (MISCELLANEOUS) ×3 IMPLANT
SUCTION FRAZIER HANDLE 10FR (MISCELLANEOUS) ×2
SUCTION TUBE FRAZIER 10FR DISP (MISCELLANEOUS) ×1 IMPLANT
SUT VIC AB 0 CT1 36 (SUTURE) ×6 IMPLANT
SUT VIC AB 1 CT1 36 (SUTURE) ×6 IMPLANT
SUT VIC AB 2-0 CT2 27 (SUTURE) ×3 IMPLANT
SYR 20ML LL LF (SYRINGE) ×3 IMPLANT
SYR 30ML LL (SYRINGE) ×6 IMPLANT
TIBIAL BASE ROT PLAT SZ 9 KNEE (Miscellaneous) ×3 IMPLANT
TIP FAN IRRIG PULSAVAC PLUS (DISPOSABLE) ×1 IMPLANT
TOWEL OR 17X26 4PK STRL BLUE (TOWEL DISPOSABLE) ×3 IMPLANT
TOWER CARTRIDGE SMART MIX (DISPOSABLE) ×3 IMPLANT
TRAY FOLEY MTR SLVR 16FR STAT (SET/KITS/TRAYS/PACK) ×3 IMPLANT
WRAPON POLAR PAD KNEE (MISCELLANEOUS) ×3

## 2019-03-05 NOTE — H&P (Signed)
The patient has been re-examined, and the chart reviewed, and there have been no interval changes to the documented history and physical.    The risks, benefits, and alternatives have been discussed at length. The patient expressed understanding of the risks benefits and agreed with plans for surgical intervention.  Dyanne Yorks P. Khi Mcmillen, Jr. M.D.    

## 2019-03-05 NOTE — Anesthesia Preprocedure Evaluation (Addendum)
Anesthesia Evaluation  Patient identified by MRN, date of birth, ID band Patient awake    Reviewed: Allergy & Precautions, H&P , NPO status , Patient's Chart, lab work & pertinent test results  Airway Mallampati: II  TM Distance: >3 FB     Dental  (+) Chipped   Pulmonary sleep apnea (improved since gastric bypass) ,           Cardiovascular hypertension, negative cardio ROS       Neuro/Psych PSYCHIATRIC DISORDERS Anxiety Depression H/o spinal stenosis, unclear severity.  Initially pt denies any neurologic symptoms in LE then notes occasional mild burning sensation in left foot that resolves within 30 seconds    GI/Hepatic Neg liver ROS, hiatal hernia (s/p repair), PUD, GERD  Controlled,S/p gastric bypass   Endo/Other  Morbid obesity (BMI 41)  Renal/GU      Musculoskeletal   Abdominal   Peds  Hematology negative hematology ROS (+)   Anesthesia Other Findings Past Medical History: No date: Arthritis No date: Cervical spinal stenosis No date: Diabetes mellitus without complication (HCC) No date: Essential tremor No date: GERD (gastroesophageal reflux disease) No date: History of hiatal hernia     Comment:  fixed with gastric bypass surgery No date: History of kidney stones No date: Hypertension No date: Hypoglycemia No date: Sleep apnea     Comment:  had gastric bypass and no longer uses cpap machine No date: Spinal stenosis, lumbar  Past Surgical History: No date: FRACTURE SURGERY; Left     Comment:  age 49 No date: GASTRIC BYPASS     Comment:  fixed hiatal hernia No date: SKIN SURGERY     Comment:  after gastric bypass and pt became septic and had wound               vac placed No date: VENA CAVA FILTER PLACEMENT     Comment:  WAS PLACED DURING PTS GASTRIC BYPASS SURGERY PER PT  BMI    Body Mass Index: 41.23 kg/m      Reproductive/Obstetrics negative OB ROS                             Anesthesia Physical Anesthesia Plan  ASA: III  Anesthesia Plan: Spinal   Post-op Pain Management:    Induction:   PONV Risk Score and Plan: Propofol infusion  Airway Management Planned: Simple Face Mask and Natural Airway  Additional Equipment:   Intra-op Plan:   Post-operative Plan:   Informed Consent: I have reviewed the patients History and Physical, chart, labs and discussed the procedure including the risks, benefits and alternatives for the proposed anesthesia with the patient or authorized representative who has indicated his/her understanding and acceptance.     Dental Advisory Given  Plan Discussed with: Anesthesiologist and CRNA  Anesthesia Plan Comments: (Discussed risks and benefits of spinal anesthesia, including an overall low but still increased risk of worsening paresthesias.  Pt understands and consents to spinal.)       Anesthesia Quick Evaluation

## 2019-03-05 NOTE — Anesthesia Post-op Follow-up Note (Signed)
Anesthesia QCDR form completed.        

## 2019-03-05 NOTE — Progress Notes (Signed)
Pt is noted to be drowsy, takes time to arouse but can answer questions. BP= 79/50, 85/41 when rechecked. Pt asked for pain medicine and Ambien prior to this. On call Dr. Leim Fabry paged and ordered to give NS 511ml bolus and monitor BP. Pain medicine and Ambien not given. Will continue to monitor closely.

## 2019-03-05 NOTE — Transfer of Care (Signed)
Immediate Anesthesia Transfer of Care Note  Patient: Curtis Alexander  Procedure(s) Performed: COMPUTER ASSISTED TOTAL KNEE ARTHROPLASTY (Right Knee)  Patient Location: PACU  Anesthesia Type:Spinal  Level of Consciousness: sedated  Airway & Oxygen Therapy: Patient Spontanous Breathing and Patient connected to face mask oxygen  Post-op Assessment: Report given to RN and Post -op Vital signs reviewed and stable  Post vital signs: Reviewed and stable  Last Vitals:  Vitals Value Taken Time  BP 110/71 03/05/19 1707  Temp    Pulse 95 03/05/19 1710  Resp 0 03/05/19 1710  SpO2 100 % 03/05/19 1710  Vitals shown include unvalidated device data.  Last Pain:  Vitals:   03/05/19 0957  PainSc: 4          Complications: No apparent anesthesia complications

## 2019-03-05 NOTE — Anesthesia Procedure Notes (Signed)
Spinal  Patient location during procedure: OR Start time: 03/05/2019 11:52 AM End time: 03/05/2019 12:07 PM Staffing Anesthesiologist: Durenda Hurt, MD Performed: anesthesiologist  Preanesthetic Checklist Completed: patient identified, site marked, surgical consent, pre-op evaluation, timeout performed, IV checked, risks and benefits discussed and monitors and equipment checked Spinal Block Patient position: sitting Prep: ChloraPrep Patient monitoring: heart rate, continuous pulse ox, blood pressure and cardiac monitor Approach: midline Location: L3-4 Injection technique: single-shot Needle Needle type: Introducer and Pencan  Needle gauge: 24 G Needle length: 9 cm Additional Notes Negative paresthesia. Negative blood return. Positive free-flowing CSF. Expiration date of kit checked and confirmed. Patient tolerated procedure well, without complications.

## 2019-03-05 NOTE — Op Note (Signed)
OPERATIVE NOTE  DATE OF SURGERY:  03/05/2019  PATIENT NAME:  Curtis Alexander   DOB: 1946/01/24  MRN: [id number]  PRE-OPERATIVE DIAGNOSIS: Degenerative arthrosis of the right knee, primary  POST-OPERATIVE DIAGNOSIS:  Same  PROCEDURE:  Right total knee arthroplasty using computer-assisted navigation  SURGEON:  Marciano Sequin. M.D.  ASSISTANT:  Benjaman Lobe, RN (present and scrubbed throughout the case, critical for assistance with exposure, retraction, instrumentation, and closure)  ANESTHESIA: spinal  ESTIMATED BLOOD LOSS: 50 mL  FLUIDS REPLACED: 1300 mL of crystalloid  TOURNIQUET TIME: 150 minutes  DRAINS: 2 medium Hemovac drains  SOFT TISSUE RELEASES: Anterior cruciate ligament, posterior cruciate ligament, deep medial collateral ligament, patellofemoral ligament  IMPLANTS UTILIZED: DePuy Attune size 8 posterior stabilized femoral component (cemented), size 9 rotating platform tibial component (cemented), 41 mm medialized dome patella (cemented), and a 5 mm stabilized rotating platform polyethylene insert.  INDICATIONS FOR SURGERY: Curtis Alexander is a 73 y.o. year old male with a long history of progressive knee pain. X-rays demonstrated severe degenerative changes in tricompartmental fashion. The patient had not seen any significant improvement despite conservative nonsurgical intervention. After discussion of the risks and benefits of surgical intervention, the patient expressed understanding of the risks benefits and agree with plans for total knee arthroplasty.   The risks, benefits, and alternatives were discussed at length including but not limited to the risks of infection, bleeding, nerve injury, stiffness, blood clots, the need for revision surgery, cardiopulmonary complications, among others, and they were willing to proceed.  PROCEDURE IN DETAIL: The patient was brought into the operating room and, after adequate spinal anesthesia was achieved, a tourniquet was  placed on the patient's upper thigh. The patient's knee and leg were cleaned and prepped with alcohol and DuraPrep and draped in the usual sterile fashion. A "timeout" was performed as per usual protocol. The lower extremity was exsanguinated using an Esmarch, and the tourniquet was inflated to 300 mmHg. An anterior longitudinal incision was made followed by a standard mid vastus approach. The deep fibers of the medial collateral ligament were elevated in a subperiosteal fashion off of the medial flare of the tibia so as to maintain a continuous soft tissue sleeve. The patella was subluxed laterally and the patellofemoral ligament was incised. Inspection of the knee demonstrated severe degenerative changes with full-thickness loss of articular cartilage. Osteophytes were debrided using a rongeur. Anterior and posterior cruciate ligaments were excised. Two 4.0 mm Schanz pins were inserted in the femur and into the tibia for attachment of the array of trackers used for computer-assisted navigation. Hip center was identified using a circumduction technique. Distal landmarks were mapped using the computer. The distal femur and proximal tibia were mapped using the computer. The distal femoral cutting guide was positioned using computer-assisted navigation so as to achieve a 5 distal valgus cut. The femur was sized and it was felt that a size 8 femoral component was appropriate. A size 8 femoral cutting guide was positioned and the anterior cut was performed and verified using the computer. This was followed by completion of the posterior and chamfer cuts. Femoral cutting guide for the central box was then positioned in the center box cut was performed.  Attention was then directed to the proximal tibia. Medial and lateral menisci were excised. The extramedullary tibial cutting guide was positioned using computer-assisted navigation so as to achieve a 0 varus-valgus alignment and 3 posterior slope. The cut was  performed and verified using the computer. The proximal tibia was sized  and it was felt that a size 9 tibial tray was appropriate. Tibial and femoral trials were inserted followed by insertion of a 5 mm polyethylene insert.  The knee was felt to be tight both in flexion and extension.  Trial components were removed and the extra medullary tibial cutting guide was repositioned so as to resect an additional 2 mm of bone proximally.  The cut was performed and verified using computer.  Trial components were reinserted with the 5 mm polyethylene trial. This allowed for excellent mediolateral soft tissue balancing both in flexion and in full extension. Finally, the patella was cut and prepared so as to accommodate a 41 mm medialized dome patella. A patella trial was placed and the knee was placed through a range of motion with excellent patellar tracking appreciated. The femoral trial was removed after debridement of posterior osteophytes. The central post-hole for the tibial component was reamed followed by insertion of a keel punch. Tibial trials were then removed. Cut surfaces of bone were irrigated with copious amounts of normal saline with antibiotic solution using pulsatile lavage and then suctioned dry. Polymethylmethacrylate cement with gentamicin was prepared in the usual fashion using a vacuum mixer. Cement was applied to the cut surface of the proximal tibia as well as along the undersurface of a size 9 rotating platform tibial component. Tibial component was positioned and impacted into place. Excess cement was removed using [employer] fast streamer. Cement was then applied to the cut surfaces of the femur as well as along the posterior flanges of the size 8 femoral component. The femoral component was positioned and impacted into place. Excess cement was removed using [employer] fast streamer. A 5 mm polyethylene trial was inserted and the knee was brought into full extension with steady axial compression applied. Finally,  cement was applied to the backside of a 41 mm medialized dome patella and the patellar component was positioned and patellar clamp applied. Excess cement was removed using [employer] fast streamer. After adequate curing of the cement, the tourniquet was deflated after a total tourniquet time of 150 minutes. Hemostasis was achieved using electrocautery. The knee was irrigated with copious amounts of normal saline with antibiotic solution using pulsatile lavage and then suctioned dry. 20 mL of 1.3% Exparel and 60 mL of 0.25% Marcaine in 40 mL of normal saline was injected along the posterior capsule, medial and lateral gutters, and along the arthrotomy site. A 5 mm stabilized rotating platform polyethylene insert was inserted and the knee was placed through a range of motion with excellent mediolateral soft tissue balancing appreciated and excellent patellar tracking noted. 2 medium drains were placed in the wound bed and brought out through separate stab incisions. The medial parapatellar portion of the incision was reapproximated using interrupted sutures of #1 Vicryl. Subcutaneous tissue was approximated in layers using first #0 Vicryl followed #2-0 Vicryl. The skin was approximated with skin staples. A sterile dressing was applied.  The patient tolerated the procedure well and was transported to the recovery room in stable condition.    Redonna Wilbert P. Holley Bouche., M.D.

## 2019-03-06 ENCOUNTER — Inpatient Hospital Stay: Payer: Medicare Other

## 2019-03-06 DIAGNOSIS — B9629 Other Escherichia coli [E. coli] as the cause of diseases classified elsewhere: Secondary | ICD-10-CM

## 2019-03-06 DIAGNOSIS — I10 Essential (primary) hypertension: Secondary | ICD-10-CM

## 2019-03-06 DIAGNOSIS — B9620 Unspecified Escherichia coli [E. coli] as the cause of diseases classified elsewhere: Secondary | ICD-10-CM

## 2019-03-06 DIAGNOSIS — Z1612 Extended spectrum beta lactamase (ESBL) resistance: Secondary | ICD-10-CM

## 2019-03-06 DIAGNOSIS — E119 Type 2 diabetes mellitus without complications: Secondary | ICD-10-CM

## 2019-03-06 DIAGNOSIS — I959 Hypotension, unspecified: Secondary | ICD-10-CM

## 2019-03-06 DIAGNOSIS — N390 Urinary tract infection, site not specified: Secondary | ICD-10-CM

## 2019-03-06 DIAGNOSIS — R251 Tremor, unspecified: Secondary | ICD-10-CM

## 2019-03-06 DIAGNOSIS — N400 Enlarged prostate without lower urinary tract symptoms: Secondary | ICD-10-CM

## 2019-03-06 DIAGNOSIS — Z96651 Presence of right artificial knee joint: Secondary | ICD-10-CM

## 2019-03-06 DIAGNOSIS — R571 Hypovolemic shock: Secondary | ICD-10-CM

## 2019-03-06 DIAGNOSIS — Z9884 Bariatric surgery status: Secondary | ICD-10-CM

## 2019-03-06 DIAGNOSIS — Z978 Presence of other specified devices: Secondary | ICD-10-CM

## 2019-03-06 DIAGNOSIS — Z960 Presence of urogenital implants: Secondary | ICD-10-CM

## 2019-03-06 DIAGNOSIS — Z87891 Personal history of nicotine dependence: Secondary | ICD-10-CM

## 2019-03-06 DIAGNOSIS — G4733 Obstructive sleep apnea (adult) (pediatric): Secondary | ICD-10-CM

## 2019-03-06 LAB — URINALYSIS, COMPLETE (UACMP) WITH MICROSCOPIC
Bilirubin Urine: NEGATIVE
Glucose, UA: NEGATIVE mg/dL
Hgb urine dipstick: NEGATIVE
Ketones, ur: NEGATIVE mg/dL
Nitrite: NEGATIVE
Protein, ur: NEGATIVE mg/dL
Specific Gravity, Urine: 1.01 (ref 1.005–1.030)
WBC, UA: >50 WBC/hpf — ABNORMAL HIGH (ref 0–5)
pH: 5 (ref 5.0–8.0)

## 2019-03-06 LAB — BASIC METABOLIC PANEL
Anion gap: 6 (ref 5–15)
BUN: 18 mg/dL (ref 8–23)
CO2: 26 mmol/L (ref 22–32)
Calcium: 8.1 mg/dL — ABNORMAL LOW (ref 8.9–10.3)
Chloride: 109 mmol/L (ref 98–111)
Creatinine, Ser: 0.84 mg/dL (ref 0.61–1.24)
GFR calc Af Amer: >60 mL/min (ref >60)
GFR calc non Af Amer: >60 mL/min (ref >60)
Glucose, Bld: 118 mg/dL — ABNORMAL HIGH (ref 70–99)
Potassium: 4.4 mmol/L (ref 3.5–5.1)
Sodium: 141 mmol/L (ref 135–145)

## 2019-03-06 LAB — CBC WITH DIFFERENTIAL/PLATELET
Abs Immature Granulocytes: 0.03 10*3/uL (ref 0.00–0.07)
Basophils Absolute: 0 10*3/uL (ref 0.0–0.1)
Basophils Relative: 0 %
Eosinophils Absolute: 0.1 10*3/uL (ref 0.0–0.5)
Eosinophils Relative: 2 %
HCT: 34.2 % — ABNORMAL LOW (ref 39.0–52.0)
Hemoglobin: 10.9 g/dL — ABNORMAL LOW (ref 13.0–17.0)
Immature Granulocytes: 1 %
Lymphocytes Relative: 32 %
Lymphs Abs: 1.9 10*3/uL (ref 0.7–4.0)
MCH: 32 pg (ref 26.0–34.0)
MCHC: 31.9 g/dL (ref 30.0–36.0)
MCV: 100.3 fL — ABNORMAL HIGH (ref 80.0–100.0)
Monocytes Absolute: 0.6 10*3/uL (ref 0.1–1.0)
Monocytes Relative: 9 %
Neutro Abs: 3.5 10*3/uL (ref 1.7–7.7)
Neutrophils Relative %: 56 %
Platelets: 99 10*3/uL — ABNORMAL LOW (ref 150–400)
RBC: 3.41 MIL/uL — ABNORMAL LOW (ref 4.22–5.81)
RDW: 13.5 % (ref 11.5–15.5)
WBC: 6.2 10*3/uL (ref 4.0–10.5)
nRBC: 0 % (ref 0.0–0.2)

## 2019-03-06 LAB — HEMOGLOBIN AND HEMATOCRIT, BLOOD
HCT: 33.3 % — ABNORMAL LOW (ref 39.0–52.0)
Hemoglobin: 10.6 g/dL — ABNORMAL LOW (ref 13.0–17.0)

## 2019-03-06 LAB — GLUCOSE, CAPILLARY
Glucose-Capillary: 110 mg/dL — ABNORMAL HIGH (ref 70–99)
Glucose-Capillary: 115 mg/dL — ABNORMAL HIGH (ref 70–99)
Glucose-Capillary: 79 mg/dL (ref 70–99)
Glucose-Capillary: 89 mg/dL (ref 70–99)

## 2019-03-06 LAB — PROCALCITONIN
Procalcitonin: <0.1 ng/mL
Procalcitonin: <0.1 ng/mL

## 2019-03-06 LAB — MRSA PCR SCREENING: MRSA by PCR: NEGATIVE

## 2019-03-06 MED ORDER — SODIUM CHLORIDE 0.9 % IV SOLN
0.0000 ug/min | INTRAVENOUS | Status: DC
  Administered 2019-03-06: 35 ug/min via INTRAVENOUS
  Administered 2019-03-06: 20 ug/min via INTRAVENOUS
  Administered 2019-03-06: 40 ug/min via INTRAVENOUS
  Administered 2019-03-06: 30 ug/min via INTRAVENOUS
  Administered 2019-03-07: 05:00:00 34 ug/min via INTRAVENOUS
  Filled 2019-03-06 (×4): qty 10
  Filled 2019-03-06: qty 1

## 2019-03-06 MED ORDER — SODIUM CHLORIDE 0.9 % IV SOLN
INTRAVENOUS | Status: DC
  Administered 2019-03-06: 04:00:00 via INTRAVENOUS

## 2019-03-06 MED ORDER — PIPERACILLIN-TAZOBACTAM 3.375 G IVPB: 3.3750 g | Freq: Three times a day (TID) | INTRAVENOUS | Status: DC

## 2019-03-06 MED ORDER — SODIUM CHLORIDE 0.9 % IV BOLUS: 500.0000 mL | Freq: Once | INTRAVENOUS | Status: DC

## 2019-03-06 MED ORDER — VANCOMYCIN HCL IN DEXTROSE 1-5 GM/200ML-% IV SOLN
1000.0000 mg | Freq: Two times a day (BID) | INTRAVENOUS | Status: DC
  Filled 2019-03-06: qty 200

## 2019-03-06 MED ORDER — INSULIN ASPART 100 UNIT/ML ~~LOC~~ SOLN: 0.0000 [IU] | Freq: Every day | SUBCUTANEOUS | Status: DC

## 2019-03-06 MED ORDER — VANCOMYCIN HCL 10 G IV SOLR
2000.0000 mg | Freq: Once | INTRAVENOUS | Status: DC
  Administered 2019-03-06: 08:00:00 2000 mg via INTRAVENOUS
  Filled 2019-03-06: qty 2000

## 2019-03-06 MED ORDER — SODIUM CHLORIDE 0.9 % IV SOLN
1.0000 g | Freq: Three times a day (TID) | INTRAVENOUS | Status: AC
  Administered 2019-03-06 – 2019-03-07 (×4): 1 g via INTRAVENOUS
  Filled 2019-03-06 (×6): qty 1

## 2019-03-06 MED ORDER — LACTATED RINGERS IV SOLN
INTRAVENOUS | Status: DC
  Administered 2019-03-06 – 2019-03-07 (×3): via INTRAVENOUS

## 2019-03-06 MED ORDER — CHLORHEXIDINE GLUCONATE CLOTH 2 % EX PADS
6.0000 | MEDICATED_PAD | Freq: Every day | CUTANEOUS | Status: DC
  Administered 2019-03-06 – 2019-03-10 (×4): 6 via TOPICAL

## 2019-03-06 MED ORDER — PIPERACILLIN-TAZOBACTAM 3.375 G IVPB
3.3750 g | Freq: Three times a day (TID) | INTRAVENOUS | Status: DC
  Administered 2019-03-06: 14:00:00 3.375 g via INTRAVENOUS
  Filled 2019-03-06: qty 50

## 2019-03-06 MED ORDER — LACTATED RINGERS IV BOLUS
500.0000 mL | Freq: Once | INTRAVENOUS | Status: AC
  Administered 2019-03-06: 500 mL via INTRAVENOUS

## 2019-03-06 MED ORDER — LACTATED RINGERS BOLUS PEDS
1000.0000 mL | Freq: Once | INTRAVENOUS | Status: AC
  Administered 2019-03-06: 1000 mL via INTRAVENOUS

## 2019-03-06 MED ORDER — SODIUM CHLORIDE 0.9 % IV BOLUS
1000.0000 mL | Freq: Once | INTRAVENOUS | Status: AC
  Administered 2019-03-06: 1000 mL via INTRAVENOUS

## 2019-03-06 MED ORDER — ENOXAPARIN SODIUM 40 MG/0.4ML ~~LOC~~ SOLN
40.0000 mg | Freq: Two times a day (BID) | SUBCUTANEOUS | Status: DC
  Administered 2019-03-06 – 2019-03-08 (×4): 40 mg via SUBCUTANEOUS
  Filled 2019-03-06 (×4): qty 0.4

## 2019-03-06 NOTE — Progress Notes (Addendum)
Blood pressure continues to drop despite two liters NS bolus. Nursing Supervisor made aware. Last BP=86/52 with fluids running at 16ml/hr. Pt to transfer to unit per Dr. Leim Fabry.

## 2019-03-06 NOTE — Progress Notes (Addendum)
Pharmacy Antibiotic Note  Curtis Alexander is a 73 y.o. male admitted on 03/05/2019 with sepsis.  Pharmacy has been consulted for vanc/zosyn dosing.  Plan: Zosyn 3.375g IV q8h (4 hour infusion).  Vancomycin 1000 mg IV Q 12 hrs. Goal AUC 400-550. Expected AUC: 482.4 SCr used: 0.84 Cssmin: 14.1  Will continue to monitor renal function and s/sx of infx.  Height: 5\' 8"  (172.7 cm) Weight: 279 lb 8.7 oz (126.8 kg) IBW/kg (Calculated) : 68.4  Temp (24hrs), Avg:98 F (36.7 C), Min:97.3 F (36.3 C), Max:98.7 F (37.1 C)  Recent Labs  Lab 03/06/19 0651  WBC 6.2  CREATININE 0.84    Estimated Creatinine Clearance: 98.7 mL/min (by C-G formula based on SCr of 0.84 mg/dL).    No Known Allergies  Thank you for allowing pharmacy to be a part of this patient's care.  Tobie Lords, PharmD, BCPS Clinical Pharmacist 03/06/2019 7:40 AM

## 2019-03-06 NOTE — Consult Note (Addendum)
Name: Curtis Alexander MRN: [id number] DOB: 07-28-1945    ADMISSION DATE:  03/05/2019 CONSULTATION DATE:  03/06/2019  REFERRING MD :  Dr. Posey Pronto  CHIEF COMPLAINT:  Hypotension  BRIEF PATIENT DESCRIPTION:  73 y.o. Male admitted on 8/12 for elective right total knee arthroplasty. He required transfer to [facility] unit on 8/13 due to hypotension with possible need for vasopressors.  UA is concerning for UTI.  Suspect Hypovolemic shock vs. Septic shock.  SIGNIFICANT EVENTS  8/12>> elective right total knee arthroplasty 8/12>> Hypotensive which responded to IVF 8/13>> Again Hypotensive, transfer to Stepdown  STUDIES:  X-ray Right Knee 8/12>> Satisfactory immediate postoperative appearance status post right total knee arthroplasty.  CULTURES: Blood x2 8/13>> Urine 8/13>> SARS-CoV-2 PCR 8/7>> Negative  ANTIBIOTICS: Cefazolin (surgical prophylaxis) 8/12>>8/13 Zosyn 8/13>>  Vancomycin 8/13>>  HISTORY OF PRESENT ILLNESS:   Curtis Alexander is a 73 year old male with a past medical history of arthritis, diabetes mellitus, essential tremor, GERD, kidney stones, hypertension, sleep apnea, spinal stenosis who presented to [facility] on 03/05/2019 for elective right total knee arthroplasty.  He has a long history of progressive knee pain, in which x-rays demonstrated severe degenerative changes and the tricompartmental fashion.  He did not have any significant improvement in pain despite conservative nonsurgical interventions, of which he elected to proceed with surgical intervention with Dr. Marry Guan.   Late in the evening on 8/12 he became hypotensive (BP 79/53) and lethargic, subsequently he received 1 L of IV fluid boluses with noted improvement in blood pressure (92/55) and mental status.  Early in the morning on 8/13 he again became hypotensive (86/52) and was given an additional 1 L normal saline bolus, with minimal improvement in blood pressure.  He is subsequently being transferred to stepdown unit for  further work-up and treatment of hypotension, with potential need for vasopressors. Suspect Hypovolemic shock verses Septic shock.  PCCM is consulted for further management.  Upon arrival to ICU, he is alert and oriented, and BP is  92/58 (MAP 69). Urinalysis is consistent with UTI.  Sepsis workup in progress.  PAST MEDICAL HISTORY :   has a past medical history of Arthritis, Cervical spinal stenosis, Diabetes mellitus without complication ([place]), Essential tremor, GERD (gastroesophageal reflux disease), History of hiatal hernia, History of kidney stones, Hypertension, Hypoglycemia, Sleep apnea, and Spinal stenosis, lumbar.  has a past surgical history that includes Gastric bypass; Skin surgery; Fracture surgery (Left); Vena cava filter placement; and Knee Arthroplasty (Right, 03/05/2019). Prior to Admission medications   Medication Sig Start Date End Date Taking? Authorizing Provider  allopurinol (ZYLOPRIM) 300 MG tablet Take 300 mg by mouth daily with lunch.   Yes [provider]  Ascorbic Acid (VITAMIN C) 1000 MG tablet Take 1,000 mg by mouth daily.   Yes [provider]  aspirin EC 81 MG tablet Take 81 mg by mouth daily.   Yes [provider]  brimonidine (ALPHAGAN) 0.2 % ophthalmic solution Place 1 drop into the right eye 2 (two) times daily.   Yes [provider]  Cholecalciferol (VITAMIN D) 50 MCG (2000 UT) CAPS Take 2,000 Units by mouth daily.   Yes [provider]  clonazePAM (KLONOPIN) 0.5 MG tablet Take 0.5 mg by mouth 2 (two) times daily.    Yes [provider]  Cyanocobalamin (B-12) 2500 MCG TABS Take 2,500 mcg by mouth daily.   Yes [provider]  cyclobenzaprine (FLEXERIL) 10 MG tablet Take 10 mg by mouth 3 (three) times daily.   Yes [provider]  erythromycin ophthalmic ointment Place 1 application into the left eye at bedtime as needed.    Yes [provider]  finasteride (PROSCAR) 5 MG tablet Take  5 mg by mouth daily with lunch.   Yes [provider]  latanoprost (XALATAN) 0.005 % ophthalmic solution Place 1 drop into the right eye at bedtime.   Yes [provider]  meloxicam (MOBIC) 15 MG tablet Take 15 mg by mouth daily with lunch.   Yes [provider]  metFORMIN (GLUCOPHAGE) 500 MG tablet Take 250 mg by mouth 2 (two) times daily with a meal.   Yes [provider]  mirtazapine (REMERON) 45 MG tablet Take 45 mg by mouth at bedtime.   Yes [provider]  omeprazole (PRILOSEC) 40 MG capsule Take 40 mg by mouth daily with lunch.   Yes [provider]  primidone (MYSOLINE) 50 MG tablet Take 50 mg by mouth 2 (two) times daily.   Yes [provider]  propranolol (INDERAL) 40 MG tablet Take 40 mg by mouth 2 (two) times daily.    Yes [provider]  tamsulosin (FLOMAX) 0.4 MG CAPS capsule Take 0.4 mg by mouth at bedtime.   Yes [provider]  topiramate (TOPAMAX) 50 MG tablet Take 50-100 mg by mouth See admin instructions. Take 100 mg by mouth in am and 50 mg at lunch   Yes [provider]  zolpidem (AMBIEN) 5 MG tablet Take 5 mg by mouth at bedtime.    Yes [provider]  loratadine (CLARITIN) 10 MG tablet Take 10 mg by mouth daily as needed for allergies.    [provider]   No Known Allergies  FAMILY HISTORY:  family history is not on file. SOCIAL HISTORY:  reports that he has never smoked. He has never used smokeless tobacco. He reports current alcohol use. He reports that he does not use drugs.   COVID-19 DISASTER DECLARATION:  FULL CONTACT PHYSICAL EXAMINATION WAS NOT POSSIBLE DUE TO TREATMENT OF COVID-19 AND  CONSERVATION OF PERSONAL PROTECTIVE EQUIPMENT, LIMITED EXAM FINDINGS INCLUDE-  Patient assessed or the symptoms described in the history of present illness.  In the context of the Global COVID-19 pandemic, which necessitated consideration that the patient might be  at risk for infection with the SARS-CoV-2 virus that causes COVID-19, Institutional protocols and algorithms that pertain to the evaluation of patients at risk for COVID-19 are in a state of rapid change based on information released by regulatory bodies including the CDC and federal and state organizations. These policies and algorithms were followed during the patient's care while in hospital.  REVIEW OF SYSTEMS:  Positives in BOLD Constitutional: Negative for fever, chills, weight loss, malaise/fatigue and diaphoresis.  HENT: Negative for hearing loss, ear pain, nosebleeds, congestion, sore throat, neck pain, tinnitus and ear discharge.   Eyes: Negative for blurred vision, double vision, photophobia, pain, discharge and redness.  Respiratory: Negative for cough, hemoptysis, sputum production, shortness of breath, wheezing and stridor.   Cardiovascular: Negative for chest pain, palpitations, orthopnea, claudication, leg swelling and PND.  Gastrointestinal: Negative for heartburn, nausea, vomiting, abdominal pain, diarrhea, constipation, blood in stool and melena.  Genitourinary: Negative for dysuria, urgency, frequency, hematuria and flank pain.  Musculoskeletal: Negative for myalgias, back pain, +joint pain and falls.  Skin: Negative for itching and rash.  Neurological: Negative for dizziness, tingling, tremors, sensory change, speech change, focal weakness, seizures, loss of consciousness, weakness and headaches.  Endo/Heme/Allergies: Negative for environmental allergies and polydipsia. Does  not bruise/bleed easily.  SUBJECTIVE:  -Reports right knee pain, wanting pain medication and something to drink -Denies dizziness, chest pain, palpitations, SOB, abdominal pain, N/V, fever, chills -On room air  VITAL SIGNS: Temp:  [97.3 F (36.3 C)-98.7 F (37.1 C)] 98 F (36.7 C) (08/12 2322) Pulse Rate:  [55-118] 57 (08/13 0403) Resp:  [10-20] 19 (08/12 2322) BP: (61-126)/(30-92) 86/52 (08/13  0431) SpO2:  [96 %-100 %] 100 % (08/13 0403) Weight:  [096 kg] 123 kg (08/12 0957)  PHYSICAL EXAMINATION: General:  Acutely ill appearing male, laying in bed, on room air, in no acute distress Neuro:  Awake, A&O x4, Follows commands, no focal deficits, speech clear HEENT:  Atraumatic, normocephalic, neck supple, no JVD, Pupils PERRLA Cardiovascular:  Regular rate & rhythm, s1-s2, no Murmurs, rubs, or gallops Lungs:  Clear to auscultation bilaterally, no wheezing, even, nonlabored, normal effort, no assessory muscle use Abdomen:  Obese, soft, nontender, nondistended, no guarding or rebound tenderness, BS+ x4 Musculoskeletal:  Normal bulk and tone, no deformites, RLE with 2 hemovac drains Skin:  Warm and dry, dressing to Right knee clean, dry, and intact  No results for input(s): NA, K, CL, CO2, BUN, CREATININE, GLUCOSE in the last 168 hours. Recent Labs  Lab 03/06/19 0318  HGB 10.6*  HCT 33.3*   Dg Knee Right Port  Result Date: 03/05/2019 CLINICAL DATA:  Postoperative for knee arthroplasty EXAM: PORTABLE RIGHT KNEE - 1-2 VIEW COMPARISON:  None. FINDINGS: Status post right total knee arthroplasty with well-positioned right distal femoral and right proximal tibial prostheses. Surgical drain terminates in the suprapatellar region. No acute osseous fracture. No dislocation. No suspicious focal osseous lesions. Vertical skin staples anteriorly in the midline. Expected soft tissue swelling and gas anteriorly about the right knee joint. IMPRESSION: Satisfactory immediate postoperative appearance status post right total knee arthroplasty. Electronically Signed   By: Ilona Sorrel M.D.   On: 03/05/2019 17:37    ASSESSMENT / PLAN:  Hypotension, Hypovolemic shock vs Septic shock -Cardiac monitoring -Maintain MAP greater than 65 -IV fluids -Received 2 L IV fluid boluses on the floor; will additional 500 ml LR bolus -Neo-Synephrine as needed to maintain MAP goal -Stat CBC, BMP, Procalcitonin  -Perform sepsis work-up  UTI -Monitor fever curve -Trend WBC's and Procalcitonin -Follow cultures as above -Discontinue Cefazolin, place on empiric coverage with Vancomycin & Zosyn for now  Right knee arthroplasty -Orthopedic surgery following, appreciate input -Postop care as per orthopedic surgery -Pain control -Incentive spirometry -PT & OT consulted         Disposition: Stepdown Goals of care: Full code VTE prophylaxis: Lovenox SQ Updates: Updated patient at bedside 03/06/2019  Darel Hong, [facility] Pager: [phone] Cell: [phone]  03/06/2019, 5:01 AM

## 2019-03-06 NOTE — Evaluation (Signed)
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  Pt having issues with hypotension post surgery and has remained in CCU with low BP.     Clinical Impression   Pt seen for OT evaluation this date, POD#1 from above surgery. Pt was independent in all ADL prior to surgery, however having increasing difficulty with tub transfers leading him to occasionally use bathing wipes for bathing instead of attempting tub transfers. Pt does not drive but lives with daughter and granddaughter temporarily who assist as needed. Pt sleeps in a recliner and uses a urinal for overnight toileting needs which he then empties and cleans in the am using his rollator to transport the urinal into the bathroom. Pt eager to return to PLOF with improved functional independence, safety, and less pain. Pt currently requires at least minimal assist for LB dressing and bathing due to pain and limited AROM of R knee. Limited functional assessment 2/2 low BP and ortho MD request for bed level session this date. Will continue to assess and address functional deficits next date. Pt instructed in polar care mgt, falls prevention strategies, home/routines modifications, DME/AE for LB bathing and dressing tasks, and compression stocking mgt. Pt would benefit from skilled OT services including additional instruction in dressing techniques with or without assistive devices for dressing and bathing skills to support recall and carryover prior to discharge and ultimately to maximize safety, independence, and minimize falls risk and caregiver burden. Recommending STR at this time.       Follow Up Recommendations  SNF    Equipment Recommendations  None recommended by OT    Recommendations for Other Services       Precautions / Restrictions Precautions Precautions: Knee;Fall Precaution Comments: per  ortho, hold standing up until BPs improve Restrictions Weight Bearing Restrictions: Yes RLE Weight Bearing: Weight bearing as tolerated      Mobility Bed Mobility General bed mobility comments: deferred  Transfers                 General transfer comment: deferred 2/2 continued low BP, but pt eager to get up when medically appropriate    Balance Overall balance assessment: Modified Independent                                         ADL either performed or assessed with clinical judgement   ADL Overall ADL's : Needs assistance/impaired                                       General ADL Comments: limited ADL assessment 2/2 bed level evaluation per ortho MD 2/2 low BP, will continue to monitor. Suspect pt will require at least Min A for LB ADL tasks 2/2 decr strength/ROM in R knee as well as pain     Vision Patient Visual Report: No change from baseline       Perception     Praxis      Pertinent Vitals/Pain Pain Assessment: 0-10 Pain Score: 6  Pain Location: R knee Pain Descriptors / Indicators: Aching Pain Intervention(s): Limited activity within patient's tolerance;Monitored during session     Hand Dominance     Extremity/Trunk Assessment Upper Extremity Assessment  Upper Extremity Assessment: Overall WFL for tasks assessed   Lower Extremity Assessment Lower Extremity Assessment: Overall WFL for tasks assessed(expected R LE weakness post-op, but AROM in all planes)   Cervical / Trunk Assessment Cervical / Trunk Assessment: Normal   Communication Communication Communication: No difficulties   Cognition Arousal/Alertness: Awake/alert Behavior During Therapy: WFL for tasks assessed/performed Overall Cognitive Status: Within Functional Limits for tasks assessed                                 General Comments: Pt very pleasant and eager to participate with PT   General Comments  BP at rest 98/42,  89/40    Exercises Other Exercises Other Exercises: pt instructed in AE/DME for LB ADL, falls prevention, and polar care mgt   Shoulder Instructions      Home Living Family/patient expects to be discharged to:: Skilled nursing facility Living Arrangements: Other relatives(daughter and granddaugher) Available Help at Discharge: Family;Available 24 hours/day Type of Home: House Home Access: Ramped entrance     Home Layout: One level               Home Equipment: Walker - 4 wheels;Shower seat;Grab bars - tub/shower;Adaptive equipment;Bedside commode;Hospital bed(has 2 (smaller one in the home, larger for OOH/MD appts)) Adaptive Equipment: Long-handled shoe horn;Long-handled sponge;Reacher Additional Comments: hospital bed is for his wife; has tub shower, comfort height toilet      Prior Functioning/Environment Level of Independence: Independent with assistive device(s)        Comments: Pt reports he is only out of the home for MD appointments, can manage in the home. Reports difficulty with tub transfers, sometimes using bathing wipes instead of attempting tub transfers. Cooks and tends to sit on a stool in the kitchen to perform meal prep.        OT Problem List: Decreased strength;Decreased range of motion;Pain;Cardiopulmonary status limiting activity      OT Treatment/Interventions: Self-care/ADL training;Therapeutic exercise;Therapeutic activities;DME and/or AE instruction;Patient/family education;Balance training    OT Goals(Current goals can be found in the care plan section) Acute Rehab OT Goals Patient Stated Goal: go to rehab near family in [place] and then go home and continue with outpatient therapy OT Goal Formulation: With patient Time For Goal Achievement: 03/20/19 Potential to Achieve Goals: Good ADL Goals Pt Will Perform Lower Body Dressing: with supervision;sit to/from stand;with adaptive equipment Pt Will Transfer to Toilet: with  supervision;ambulating;bedside commode(LRAD for amb) Additional ADL Goal #1: Pt will independently instruct family/caregiver in compression stocking mgt Additional ADL Goal #2: Pt will independently instruct family/caregiver in polar care mgt  OT Frequency: Min 1X/week   Barriers to D/C: Decreased caregiver support          Co-evaluation              AM-PAC OT "6 Clicks" Daily Activity     Outcome Measure Help from another person eating meals?: None Help from another person taking care of personal grooming?: None Help from another person toileting, which includes using toliet, bedpan, or urinal?: A Lot Help from another person bathing (including washing, rinsing, drying)?: A Lot Help from another person to put on and taking off regular upper body clothing?: None Help from another person to put on and taking off regular lower body clothing?: A Lot 6 Click Score: 18   End of Session    Activity Tolerance: Patient tolerated treatment well Patient left: in bed;with call bell/phone  within reach;with bed alarm set;with SCD's reapplied;Other (comment)(polar care in place)  OT Visit Diagnosis: Other abnormalities of gait and mobility (R26.89);Pain Pain - Right/Left: Right Pain - part of body: Knee                Time: 1510-1538 OT Time Calculation (min): 28 min Charges:  OT General Charges $OT Visit: 1 Visit OT Evaluation $OT Eval Low Complexity: 1 Low OT Treatments $Self Care/Home Management : 8-22 mins  Jeni Salles, MPH, MS, OTR/L ascom [phone] 03/06/19, 4:00 PM

## 2019-03-06 NOTE — Consult Note (Signed)
NAME: Curtis Alexander  DOB: Sep 27, 1945  MRN: [id number]  Date/Time: 03/06/2019 4:58 PM  REQUESTING PROVIDER: Dr. Patsey Berthold Subjective:  REASON FOR CONSULT: Sepsis with ESBL UTI ? Curtis Alexander is a 73 y.o. male with a history of diabetes mellitus, hypertension, OSA, history of gastric bypass was admitted to the hospital on 03/05/19 for total knee arthroplasty.  He underwent  Right  total knee arthroplasty on 03/05/2019 .  Overnight he became drowsy and also had low blood pressure.  He did not respond to 500 cc of bolus.  His map was still in the 60s.  So rapid response was called and the patient was transferred to stepdown ICU on 03/06/2019.Marland Kitchen Blood and urine culture was sent.  He was started on Zosyn and vancomycin.  He had received cefazolin as surgical prophylaxis. Prior to surgery had a urine analysis and that showed ESBL E. coli. I am asked to see the patient to rule out ESBL sepsis.  Yesterday in the OR patient had a coud and a Foley placed.  No trauma.  Patient is sitting in the bed.  He looks very well.  He says he does not have any fever or chills.  No cough or shortness of breath.  He does say that at home he sometimes has trouble initiating urine flow.  Especially at nighttime.  He does not have any dysuria or hematuria.    Past medical history Osteoarthritis Diabetes mellitus Cervical spine stenosis Essential tremor GERD Hiatal hernia Kidney stones Hypertension Sleep apnea Lumbar stenosis Morbid obesity for which he underwent gastric bypass surgery.  Past surgical history Gastric bypass done on 01/18/2004. Insertion of [place] filter in 2005 Abdominoplasty and repair of ventral hernia 04/29/2007.   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 Debridement of abdominal wall necrosis on 05/14/2007. Fracture surgery age 29.  Social history Quit smoking 35 years ago  Family history Cancer mother Diabetes Father Heart  failure Father Bipolar disorder daughter   No Known Allergies  ? Current Facility-Administered Medications  Medication Dose Route Frequency Provider Last Rate Last Dose  . acetaminophen (OFIRMEV) IV 1,000 mg  1,000 mg Intravenous Q6H Hooten, Laurice Record, MD 400 mL/hr at 03/06/19 1135 1,000 mg at 03/06/19 1135  . acetaminophen (TYLENOL) tablet 325-650 mg  325-650 mg Oral Q6H PRN Hooten, Laurice Record, MD      . allopurinol (ZYLOPRIM) tablet 300 mg  300 mg Oral Q lunch Hooten, Laurice Record, MD   300 mg at 03/06/19 1331  . alum & mag hydroxide-simeth (MAALOX/MYLANTA) 200-200-20 MG/5ML suspension 30 mL  30 mL Oral Q4H PRN Hooten, Laurice Record, MD      . bisacodyl (DULCOLAX) suppository 10 mg  10 mg Rectal Daily PRN Hooten, Laurice Record, MD      . brimonidine (ALPHAGAN) 0.2 % ophthalmic solution 1 drop  1 drop Right Eye BID Hooten, Laurice Record, MD   1 drop at 03/06/19 1332  . celecoxib (CELEBREX) capsule 200 mg  200 mg Oral BID Dereck Leep, MD   200 mg at 03/06/19 1040  . Chlorhexidine Gluconate Cloth 2 % PADS 6 each  6 each Topical Q0600 Bradly Bienenstock, NP   6 each at 03/06/19 0600  . cholecalciferol (VITAMIN D) tablet 2,000 Units  2,000 Units Oral Daily Hooten, Laurice Record, MD   2,000 Units at 03/06/19 1040  . clonazePAM (KLONOPIN) tablet 0.5 mg  0.5 mg Oral BID Dereck Leep, MD   0.5 mg at 03/06/19 1040  .  cyclobenzaprine (FLEXERIL) tablet 10 mg  10 mg Oral TID Dereck Leep, MD   10 mg at 03/06/19 1040  . diphenhydrAMINE (BENADRYL) 12.5 MG/5ML elixir 12.5-25 mg  12.5-25 mg Oral Q4H PRN Hooten, Laurice Record, MD      . enoxaparin (LOVENOX) injection 40 mg  40 mg Subcutaneous Q12H Raymar, Joiner, RPH      . erythromycin ophthalmic ointment 1 application  1 application Left Eye QHS PRN Hooten, Laurice Record, MD      . ferrous sulfate tablet 325 mg  325 mg Oral BID WC Hooten, Laurice Record, MD      . finasteride (PROSCAR) tablet 5 mg  5 mg Oral Q lunch Hooten, Laurice Record, MD   5 mg at 03/06/19 1331  . gabapentin (NEURONTIN) capsule  300 mg  300 mg Oral QHS Hooten, Laurice Record, MD   300 mg at 03/05/19 2146  . HYDROmorphone (DILAUDID) injection 0.5-1 mg  0.5-1 mg Intravenous Q4H PRN Hooten, Laurice Record, MD      . insulin aspart (novoLOG) injection 0-15 Units  0-15 Units Subcutaneous TID WC Hooten, Laurice Record, MD      . insulin aspart (novoLOG) injection 0-5 Units  0-5 Units Subcutaneous QHS Tyler Pita, MD      . lactated ringers infusion   Intravenous Continuous Bradly Bienenstock, NP 125 mL/hr at 03/06/19 [phone]    . latanoprost (XALATAN) 0.005 % ophthalmic solution 1 drop  1 drop Right Eye QHS Hooten, Laurice Record, MD   1 drop at 03/05/19 2156  . loratadine (CLARITIN) tablet 10 mg  10 mg Oral Daily PRN Hooten, Laurice Record, MD      . magnesium hydroxide (MILK OF MAGNESIA) suspension 30 mL  30 mL Oral Daily Hooten, Laurice Record, MD   30 mL at 03/06/19 1040  . menthol-cetylpyridinium (CEPACOL) lozenge 3 mg  1 lozenge Oral PRN Hooten, Laurice Record, MD       Or  . phenol (CHLORASEPTIC) mouth spray 1 spray  1 spray Mouth/Throat PRN Hooten, Laurice Record, MD      . metoCLOPramide (REGLAN) tablet 5-10 mg  5-10 mg Oral Q8H PRN Hooten, Laurice Record, MD       Or  . metoCLOPramide (REGLAN) injection 5-10 mg  5-10 mg Intravenous Q8H PRN Hooten, Laurice Record, MD      . metoCLOPramide (REGLAN) tablet 10 mg  10 mg Oral TID AC & HS Hooten, Laurice Record, MD   10 mg at 03/06/19 0800  . mirtazapine (REMERON) tablet 45 mg  45 mg Oral QHS Dereck Leep, MD   45 mg at 03/05/19 2145  . ondansetron (ZOFRAN) tablet 4 mg  4 mg Oral Q6H PRN Hooten, Laurice Record, MD       Or  . ondansetron (ZOFRAN) injection 4 mg  4 mg Intravenous Q6H PRN Hooten, Laurice Record, MD      . oxyCODONE (Oxy IR/ROXICODONE) immediate release tablet 10 mg  10 mg Oral Q4H PRN Hooten, Laurice Record, MD      . oxyCODONE (Oxy IR/ROXICODONE) immediate release tablet 5 mg  5 mg Oral Q4H PRN Hooten, Laurice Record, MD   5 mg at 03/05/19 2004  . pantoprazole (PROTONIX) EC tablet 40 mg  40 mg Oral BID Dereck Leep, MD   40 mg at 03/06/19 1040  .  phenylephrine (NEO-SYNEPHRINE) 10 mg in sodium chloride 0.9 % 250 mL (0.04 mg/mL) infusion  0-400 mcg/min Intravenous Titrated Bradly Bienenstock, NP 60 mL/hr at  03/06/19 1544 40 mcg/min at 03/06/19 1544  . piperacillin-tazobactam (ZOSYN) IVPB 3.375 g  3.375 g Intravenous Q8H Dae, Highley, RPH 12.5 mL/hr at 03/06/19 1330 3.375 g at 03/06/19 1330  . primidone (MYSOLINE) tablet 50 mg  50 mg Oral BID Dereck Leep, MD   50 mg at 03/06/19 1332  . senna-docusate (Senokot-S) tablet 1 tablet  1 tablet Oral BID Hooten, Laurice Record, MD   1 tablet at 03/06/19 1040  . sodium phosphate (FLEET) 7-19 GM/118ML enema 1 enema  1 enema Rectal Once PRN Hooten, Laurice Record, MD      . tamsulosin (FLOMAX) capsule 0.4 mg  0.4 mg Oral QHS Hooten, Laurice Record, MD   0.4 mg at 03/05/19 2146  . topiramate (TOPAMAX) tablet 100 mg  100 mg Oral Q breakfast Hooten, Laurice Record, MD      . topiramate (TOPAMAX) tablet 50 mg  50 mg Oral Q lunch Hooten, Laurice Record, MD   50 mg at 03/06/19 1332  . traMADol (ULTRAM) tablet 50-100 mg  50-100 mg Oral Q4H PRN Dereck Leep, MD   100 mg at 03/06/19 1027  . vitamin B-12 (CYANOCOBALAMIN) tablet 2,500 mcg  2,500 mcg Oral Daily Hooten, Laurice Record, MD   2,500 mcg at 03/06/19 1039  . vitamin C (ASCORBIC ACID) tablet 1,000 mg  1,000 mg Oral Daily Hooten, Laurice Record, MD   1,000 mg at 03/06/19 1040  . zolpidem (AMBIEN) tablet 5 mg  5 mg Oral QHS Hooten, Laurice Record, MD         Abtx:  Anti-infectives (From admission, onward)   Start     Dose/Rate Route Frequency Ordered Stop   03/06/19 2000  vancomycin (VANCOCIN) IVPB 1000 mg/200 mL premix  Status:  Discontinued     1,000 mg 200 mL/hr over 60 Minutes Intravenous Every 12 hours 03/06/19 0744 03/06/19 0949   03/06/19 1400  piperacillin-tazobactam (ZOSYN) IVPB 3.375 g     3.375 g 12.5 mL/hr over 240 Minutes Intravenous Every 8 hours 03/06/19 1021     03/06/19 0745  piperacillin-tazobactam (ZOSYN) IVPB 3.375 g  Status:  Discontinued     3.375 g 12.5 mL/hr over 240  Minutes Intravenous Every 8 hours 03/06/19 0740 03/06/19 1021   03/06/19 0745  vancomycin (VANCOCIN) 2,000 mg in sodium chloride 0.9 % 500 mL IVPB  Status:  Discontinued     2,000 mg 250 mL/hr over 120 Minutes Intravenous  Once 03/06/19 0742 03/06/19 0949   03/05/19 2000  ceFAZolin (ANCEF) IVPB 2g/100 mL premix  Status:  Discontinued     2 g 200 mL/hr over 30 Minutes Intravenous Every 6 hours 03/05/19 1758 03/06/19 0732   03/05/19 0944  ceFAZolin (ANCEF) 2-4 GM/100ML-% IVPB    Note to Pharmacy: Myles Lipps   : cabinet override      03/05/19 0944 03/05/19 1219   03/05/19 0600  ceFAZolin (ANCEF) IVPB 2g/100 mL premix     2 g 200 mL/hr over 30 Minutes Intravenous On call to O.R. 03/05/19 0427 03/05/19 1637      REVIEW OF SYSTEMS:  Const: negative fever, negative chills, negative weight loss Eyes: negative diplopia or visual changes, negative eye pain ENT: negative coryza, negative sore throat Resp: negative cough, hemoptysis, dyspnea Cards: negative for chest pain, palpitations, lower extremity edema GU: As above GI: Negative for abdominal pain, diarrhea, bleeding, constipation Skin: negative for rash and pruritus Heme: negative for easy bruising and gum/nose bleeding MS: Restricted mobility due to arthritis in his knees.  Neurolo:negative for headaches, dizziness, vertigo, memory problems  Psych: negative for feelings of anxiety, depression  Endocrine: negative for thyroid, diabetes Allergy/Immunology- negative for any medication or food allergies  Objective:  VITALS:  Patient Vitals for the past 24 hrs:  BP Temp Temp src Pulse Resp SpO2 Height Weight  03/06/19 1700 (!) 101/59 - - 69 12 100 % - -  03/06/19 1600 (!) 103/48 - - 77 13 98 % - -  03/06/19 1500 (!) 89/34 - - (!) 111 (!) 22 93 % - -  03/06/19 1400 (!) 98/39 - - 72 15 93 % - -  03/06/19 1300 (!) 104/92 98.5 F (36.9 C) Oral 82 18 97 % - -  03/06/19 1200 - - - (!) 57 14 96 % - -  03/06/19 1100 - - - (!) 59 17 95 %  - -  03/06/19 1000 (!) 94/38 - - (!) 57 18 100 % - -  03/06/19 0900 (!) 90/33 - - 71 18 100 % - -  03/06/19 0800 (!) 86/48 - - (!) 57 12 100 % - -  03/06/19 0730 (!) 75/63 97.7 F (36.5 C) Oral 67 16 99 % - -  03/06/19 0700 (!) 86/45 - - (!) 57 14 100 % - -  03/06/19 0600 (!) 92/58 98.2 F (36.8 C) Axillary 60 13 99 % 5\' 8"  (1.727 m) 126.8 kg  03/06/19 0431 (!) 86/52 - - - - - - -  03/06/19 0403 (!) 86/49 - - (!) 57 - 100 % - -  03/06/19 0345 (!) 91/49 - - - - - - -  03/06/19 0313 (!) 90/50 - - - - - - -  03/06/19 0208 (!) 84/52 - - - - - - -  03/06/19 0116 (!) 84/44 - - 61 - 100 % - -  03/06/19 0108 (!) 92/52 - - 60 - 100 % - -  03/06/19 0058 (!) 99/56 - - (!) 58 - 100 % - -  03/06/19 0042 (!) 92/55 - - (!) 58 - 100 % - -  03/06/19 0029 (!) 79/53 - - - - - - -  03/06/19 0023 (!) 61/30 - - - - - - -  03/06/19 0000 (!) 72/30 - - (!) 59 - - - -  03/05/19 2322 (!) 85/41 98 F (36.7 C) - (!) 55 19 96 % - -  03/05/19 2216 (!) 113/92 97.9 F (36.6 C) Oral 61 20 99 % - -  03/05/19 2050 115/73 98.4 F (36.9 C) Oral 85 19 97 % - -  03/05/19 1914 126/71 97.6 F (36.4 C) Oral 73 20 100 % - -  03/05/19 1809 122/75 - - 81 17 100 % - -  03/05/19 1752 (!) 102/51 - - 89 17 100 % - -  03/05/19 1749 - - - 86 13 100 % - -  03/05/19 1745 - - - 89 19 96 % - -  03/05/19 1738 104/66 98.1 F (36.7 C) - 86 10 98 % - -  03/05/19 1722 123/75 - - 87 11 100 % - -   PHYSICAL EXAM:  General: Alert, cooperative, no distress, appears stated age.  Pale Head: Normocephalic, without obvious abnormality, atraumatic. Eyes: Conjunctivae clear, anicteric sclerae. Pupils are equal ENT Nares normal. No drainage or sinus tenderness. Lips, mucosa, and tongue normal. No Thrush Neck: Supple, symmetrical, no adenopathy, thyroid: non tender no carotid bruit and no JVD. Back: No CVA tenderness. Lungs: Clear to auscultation bilaterally. No Wheezing  or Rhonchi. No rales. Heart: Regular rate and rhythm, no murmur, rub or  gallop. Abdomen: Soft, laparotomy scar.  Some scabbing over the incision scar  extremities: Right leg surgical dressing as well as VAC drain.   Skin: No rashes or lesions. Or bruising Lymph: Cervical, supraclavicular normal. Neurologic: Grossly non-focal Tremors present Pertinent Labs Lab Results CBC    Component Value Date/Time   WBC 6.2 03/06/2019 0651   RBC 3.41 (L) 03/06/2019 0651   HGB 10.9 (L) 03/06/2019 0651   HCT 34.2 (L) 03/06/2019 0651   PLT 99 (L) 03/06/2019 0651   MCV 100.3 (H) 03/06/2019 0651   MCH 32.0 03/06/2019 0651   MCHC 31.9 03/06/2019 0651   RDW 13.5 03/06/2019 0651   LYMPHSABS 1.9 03/06/2019 0651   MONOABS 0.6 03/06/2019 0651   EOSABS 0.1 03/06/2019 0651   BASOSABS 0.0 03/06/2019 0651    CMP Latest Ref Rng & Units 03/06/2019 02/26/2019  Glucose 70 - 99 mg/dL 118(H) 92  BUN 8 - 23 mg/dL 18 22  Creatinine 0.61 - 1.24 mg/dL 0.84 0.75  Sodium 135 - 145 mmol/L 141 142  Potassium 3.5 - 5.1 mmol/L 4.4 4.1  Chloride 98 - 111 mmol/L 109 108  CO2 22 - 32 mmol/L 26 24  Calcium 8.9 - 10.3 mg/dL 8.1(L) 8.6(L)  Total Protein 6.5 - 8.1 g/dL - 6.2(L)  Total Bilirubin 0.3 - 1.2 mg/dL - 0.5  Alkaline Phos 38 - 126 U/L - 98  AST 15 - 41 U/L - 22  ALT 0 - 44 U/L - 20      Microbiology: Recent Results (from the past 240 hour(s))  Urine culture     Status: Abnormal   Collection Time: 02/26/19 11:07 AM   Specimen: Urine, Random  Result Value Ref Range Status   Specimen Description   Final    URINE, RANDOM Performed at [facility], [address] [place], [place] [address]    Special Requests   Final    Normal Performed at [facility], [address] [place], [place] [address]    Culture (A)  Final    >=100,000 COLONIES/mL ESCHERICHIA COLI Confirmed Extended Spectrum Beta-Lactamase Producer (ESBL).  In bloodstream infections from ESBL organisms, carbapenems are preferred over piperacillin/tazobactam. They are shown to have a lower risk of  mortality.    Report Status 03/01/2019 FINAL  Final   Organism ID, Bacteria ESCHERICHIA COLI (A)  Final      Susceptibility   Escherichia coli - MIC*    AMPICILLIN >=32 RESISTANT Resistant     CEFAZOLIN >=64 RESISTANT Resistant     CEFTRIAXONE >=64 RESISTANT Resistant     CIPROFLOXACIN >=4 RESISTANT Resistant     GENTAMICIN <=1 SENSITIVE Sensitive     IMIPENEM <=0.25 SENSITIVE Sensitive     NITROFURANTOIN 64 INTERMEDIATE Intermediate     TRIMETH/SULFA >=320 RESISTANT Resistant     AMPICILLIN/SULBACTAM >=32 RESISTANT Resistant     PIP/TAZO <=4 SENSITIVE Sensitive     Extended ESBL POSITIVE Resistant     * >=100,000 COLONIES/mL ESCHERICHIA COLI  Surgical pcr screen     Status: None   Collection Time: 02/26/19 11:07 AM   Specimen: Nasal Mucosa; Nasal Swab  Result Value Ref Range Status   MRSA, PCR NEGATIVE NEGATIVE Final   Staphylococcus aureus NEGATIVE NEGATIVE Final    Comment: (NOTE) The Xpert SA Assay (FDA approved for NASAL specimens in patients 39 years of age and older), is one component of a comprehensive surveillance program. It  is not intended to diagnose infection nor to guide or monitor treatment. Performed at [facility], [place], [place] [address]   SARS CORONAVIRUS 2 Nasal Swab Aptima Multi Swab     Status: None   Collection Time: 02/28/19  8:53 AM   Specimen: Aptima Multi Swab; Nasal Swab  Result Value Ref Range Status   SARS Coronavirus 2 NEGATIVE NEGATIVE Final    Comment: (NOTE) SARS-CoV-2 target nucleic acids are NOT DETECTED. The SARS-CoV-2 RNA is generally detectable in upper and lower respiratory specimens during the acute phase of infection. Negative results do not preclude SARS-CoV-2 infection, do not rule out co-infections with other pathogens, and should not be used as the sole basis for treatment or other patient management decisions. Negative results must be combined with clinical observations, patient history, and  epidemiological information. The expected result is Negative. Fact Sheet for Patients: SugarRoll.be Fact Sheet for Healthcare Providers: https://www.woods-mathews.com/ This test is not yet approved or cleared by the [place] FDA and  has been authorized for detection and/or diagnosis of SARS-CoV-2 by FDA under an Emergency Use Authorization (EUA). This EUA will remain  in effect (meaning this test can be used) for the duration of the COVID-19 declaration under Section 56 4(b)(1) of the Act, 21 U.S.C. section 360bbb-3(b)(1), unless the authorization is terminated or revoked sooner. Performed at [facility], [place] [address], [place], [place] [address]   MRSA PCR Screening     Status: None   Collection Time: 03/06/19  5:51 AM   Specimen: Nasopharyngeal  Result Value Ref Range Status   MRSA by PCR NEGATIVE NEGATIVE Final    Comment:        The GeneXpert MRSA Assay (FDA approved for NASAL specimens only), is one component of a comprehensive MRSA colonization surveillance program. It is not intended to diagnose MRSA infection nor to guide or monitor treatment for MRSA infections. Performed at [facility], [place], [place] [address]    Procalcitonin less than 0.1 IMAGING RESULTS: I have personally reviewed the films ? Impression/Recommendation ?73 year old male with history of osteoarthritis, underwent right knee arthroplasty on 03/05/2019.  Few hours after that he was hypotensive and was shifted to stepdown.  Hypotension.  Concern for sepsis.  But he looks well with no fever or white count and pro calcitonin is normal.  He did have ESBL E. coli in the urine on 02/24/2019 .  He had a coud placed yesterday but that was not traumatic.  Likely the ESBL E. coli was colonizing the bladder as he has BPH and possibly had residue . Currently there is a concern that whether this is  sepsis due to this E. coli.   Blood culture has been sent,so has urine culture. He is on Zosyn but I will change that to meropenem.  I doubt he has sepsis but will treat him for 48 hours till we get the cultures back.  Most importantly we need to make sure that he does not have residual urine and hence after the Foley has been removed he needs to have a post void bladder scan   ?Likely had hypotension from dehydration and narcotic medications.  He had received fentanyl.  As he is on pressor rule out other causes for hypotension including adrenal insufficiency, cardiac or pulmonary pathology.  He is still on finasteride which has to be stopped  Will need EKG, chest x-ray and a cortisol level  Macrocytic anemia need to check B12  folate  History of gastric bypass in the past  History of abdominoplasty complicated by infection of the abdominal tissue.  Was treated with wound VAC for almost a year and with multiple courses of antibiotics in 2008 _  __________________________________________________ Discussed with patient, and his daughter and Dr. Patsey Berthold.  Also discussed with Dr. Marry Guan. Note:  This document was prepared using Dragon voice recognition software and may include unintentional dictation errors.

## 2019-03-06 NOTE — Significant Event (Signed)
Rapid Response Event Note  Overview: Time Called: 0031 Arrival Time: 0035 Event Type: Hypotension  Initial Focused Assessment: RR called for patient with hypotension and lethargy.  On arrival to room, BP noted to be 79/53. Patient easily arouses and is oriented x4.  533ml bolus already given prior to RR being called.   Interventions:  Another 535ml bolus given per RR protocol.  BP improved to 92/55 and 99/56.  Spoke to on call ortho surgeon and discussed giving another 1L bolus since patient's BP responds to fluids and appears to be volume depleted.  Called and spoke to Dr. Sidney Ace regarding plan of care.  Plan of Care (if not transferred):  1L NS bolus over 2 hours and recheck BP after.  Recommend encouraging PO fluids as patient states he normally drinks a lot of fluids at baseline.  Will check back with bedside RN to review patient's status at that time and if further interventions are needed.  Event Summary: Name of Physician Notified: Dr. Leim Fabry at [phone]  Name of Consulting Physician Notified: Dr. Sidney Ace at [facility] in room and stabalized  Event End Time: 0115  Zettie Pho

## 2019-03-06 NOTE — Progress Notes (Addendum)
Pharmacy Antibiotic Note  Curtis Alexander is a 73 y.o. male admitted on 03/05/2019 for total right knee arthroplasty. Patient admitted to ICU on 8/13 requiring pressors secondary to postoperative hypovolemia. 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 Pharmacy has been consulted for Zosyn dosing.  Plan: Per AM ICU round vancomycin was discontinued. Will continue Zosyn 3.375g IV Q8hr pending ID consult.   Height: 5\' 8"  (172.7 cm) Weight: 279 lb 8.7 oz (126.8 kg) IBW/kg (Calculated) : 68.4  Temp (24hrs), Avg:98.1 F (36.7 C), Min:97.6 F (36.4 C), Max:98.7 F (37.1 C)  Recent Labs  Lab 03/06/19 0651  WBC 6.2  CREATININE 0.84    Estimated Creatinine Clearance: 98.7 mL/min (by C-G formula based on SCr of 0.84 mg/dL).    No Known Allergies  Antimicrobials this admission: Cefazolin (surgical prophylaxis) 8/12>>8/13 Vancomycin 8/13 x 1 Zosyn 8/13 >>   Dose adjustments this admission: N/A  Microbiology results: 8/5 UCx: ESBL Ecoli 8/13 BCx: pending  8/13 UCx: pending   8/5 PCR: Staph negative; MRSA Negative  8/13 MRSA PCR: negative   Thank you for allowing pharmacy to be a part of this patient's care.  [name],Kaesen L 03/06/2019 3:39 PM

## 2019-03-06 NOTE — Progress Notes (Signed)
Pt is noted to be pale, lethargic and blood pressure continues to drop. Dr. Posey Pronto made aware for the second time, rapid response called. Hospitalist consult was made but haven't seen the pt. Another 1 liter NS bolus given. BP at 90's/50's while the bolus is going on. Multiple phone calls has been done with Rapid Response nurse post bolus. Pt being closely monitored.

## 2019-03-06 NOTE — Progress Notes (Signed)
PHARMACIST - PHYSICIAN COMMUNICATION  CONCERNING:  Enoxaparin (Lovenox) for DVT Prophylaxis   RECOMMENDATION: Patient was prescribed enoxaprin 30mg  every 12  hours for VTE prophylaxis.   Filed Weights   03/05/19 0957 03/06/19 0600  Weight: 271 lb 2.7 oz (123 kg) 279 lb 8.7 oz (126.8 kg)    Body mass index is 42.5 kg/m.  Estimated Creatinine Clearance: 98.7 mL/min (by C-G formula based on SCr of 0.84 mg/dL).   Based on Curtis Alexander patient is candidate for enoxaparin 40mg  every 12 hour dosing due to BMI being >40.  DESCRIPTION: Pharmacy has adjusted enoxaparin dose per [facility] policy.  Patient is now receiving enoxaparin 40mg  every 12 hours.   Jalon Squier,Jaymir L, RPh 03/06/2019 12:01 PM

## 2019-03-06 NOTE — Progress Notes (Addendum)
Subjective: 1 Day Post-Op Procedure(s) (LRB): COMPUTER ASSISTED TOTAL KNEE ARTHROPLASTY (Right) Patient reports pain as moderate.   Patient seen in rounds with Dr. Marry Guan. Patient is having problems with hypotension during the night.  Transferred to ICU this AM, with BP arounbd 86/42.  Received 2500 cc's IV with limited pain medicine.  BP still running 90/40.  Talking with decreasing Lethargy.   Plan is to go Home vs Rehab after hospital stay. Negative for chest pain and shortness of breath Fever: no Gastrointestinal:Negative for nausea and vomiting  Objective: Vital signs in last 24 hours: Temp:  [97.3 F (36.3 C)-98.7 F (37.1 C)] 98.2 F (36.8 C) (08/13 0600) Pulse Rate:  [55-118] 60 (08/13 0600) Resp:  [10-20] 13 (08/13 0600) BP: (61-126)/(30-92) 92/58 (08/13 0600) SpO2:  [96 %-100 %] 99 % (08/13 0600) Weight:  [123 kg-126.8 kg] 126.8 kg (08/13 0600)  Intake/Output from previous day:  Intake/Output Summary (Last 24 hours) at 03/06/2019 9983 Last data filed at 03/06/2019 0600 Gross per 24 hour  Intake 5183.39 ml  Output 2575 ml  Net 2608.39 ml    Intake/Output this shift: Total I/O In: 3708.4 [I.V.:1166.7; IV Piggyback:2541.7] Out: 1650 [Urine:1400; Drains:250]  Labs: Recent Labs    03/06/19 0318  HGB 10.6*   Recent Labs    03/06/19 0318  HCT 33.3*   No results for input(s): NA, K, CL, CO2, BUN, CREATININE, GLUCOSE, CALCIUM in the last 72 hours. No results for input(s): LABPT, INR in the last 72 hours.   EXAM General - Patient is Alert and Oriented Extremity - Neurovascular intact Sensation intact distally Compartment soft Dressing/Incision - clean, dry, with the Hemovac intact Motor Function - intact, moving foot and toes well on exam. Able to do a Straight Leg Raise independently.  Past Medical History:  Diagnosis Date  . Arthritis   . Cervical spinal stenosis   . Diabetes mellitus without complication ([place])   . Essential tremor   . GERD  (gastroesophageal reflux disease)   . History of hiatal hernia    fixed with gastric bypass surgery  . History of kidney stones   . Hypertension   . Hypoglycemia   . Sleep apnea    had gastric bypass and no longer uses cpap machine  . Spinal stenosis, lumbar     Assessment/Plan: 1 Day Post-Op Procedure(s) (LRB): COMPUTER ASSISTED TOTAL KNEE ARTHROPLASTY (Right) Active Problems:   Total knee replacement status  Estimated body mass index is 42.5 kg/m as calculated from the following:   Height as of this encounter: 5\' 8"  (1.727 m).   Weight as of this encounter: 126.8 kg. Advance diet  Up with Therapy when vitals stable. Discharge planning  DVT Prophylaxis - Lovenox, Foot Pumps and TED hose Weight-Bearing as tolerated to Right leg  [name], PA-C Orthopaedic Surgery 03/06/2019, 6:32 AM   Addendum: I discussed the episode that occurred last night with nursing.  The hospitalist did NOT evaluate the patient last night.  The patient is awake and alert this morning.  He does complain of some differences in speech but otherwise denies any other numbness or weakness.  He denies any difficulties with swallowing.  Cardiac: Regular rate and rhythm with normal S1-S2.  No murmurs, gallops, rubs. Lungs: Clear to auscultation bilaterally.  Right lower extremity: Dressing is dry and intact.  Hemovac drain and Polar Care are in place and functioning.  The patient is able to perform an independent straight leg raise.  Neurologic: Awake, alert, oriented.  Sensory and motor  function are intact.  Tongue is midline.  No gross dysarthria.  Impression: Right total knee arthroplasty Hypotension  Plan: I discussed the patient's status with the nurse practitioner in the CCU.  The patient has been bolused multiple times with marginal improvement in his pressures.  They plan to begin low-dose pressors.  No evidence of a CVA.  The patient's preoperative urine culture was positive for E. coli  (ESBL).  I had discussed the patient's findings with Dr. Tommy Medal, [facility].  He was adamant there was no treatment necessary given the fact that the patient was completely asymptomatic.  I appreciate the support by the nursing staff and the CCU staff.  [name] M.D.

## 2019-03-06 NOTE — Progress Notes (Signed)
Ortho MD notified of hypotension and 576ml bolus ordered. Upon completion pt remained hypotensive. Rapid response called at 0025 for hypotension and lethargy. RRT arrived at bedside and begin initiating protocol. Ortho MD called and hospitalist consult was requested due to hypotension despite bolus given. Hospitalist consult deferred per ortho MD. Patient continued to be hypotensive and lethargic. RRT consulted on patient condition frequently and additional liter bolus ordered. Despite 2 liter bolus given patient remained hypotensive. RRT suggested additional liter bolus and contacting hospitalist for further guidance and interventions. AC contacted for involvement due to no hospitalist seeing patient at that time. Per [facility] hospitalist will order transfer and see patient. No MD at bedside prior to transfer.

## 2019-03-06 NOTE — Progress Notes (Signed)
I was called earlier tonight due to patient having low BPs with some drowsiness. Last pain medication dosage had been over 3 hours prior to this BP reading. 500cc bolus was given without improvement in BP. He was also described as drowsier. There was enough clinical concern from the RN staff and the description of the patient given to me, so a rapid response was called. The patient was assessed by the ICU staff and another 500cc bolus w as given with improvement in his MAPs to the 60s. He was able to have full conversations and was easily arousable. A Hospitalist consult was requested but given that the patient had symptomatic improvement and was stable, this was deferred. Plan in discussion with hospitalist and ICU staff is for continued volume replacement with 1L fluid over 2 hours. I did discuss with the hospitalist team and RN staff that if his BP did not stay main tained/appropriately respond, we would request formal hospitalist consult overnight.

## 2019-03-06 NOTE — Evaluation (Signed)
Physical Therapy Evaluation Patient Details Name: Curtis Alexander MRN: [id number] DOB: July 26, 1945 Today's Date: 03/06/2019   History of Present Illness  73 y/o male s/p R TKA 03/05/19.  Pt having issues with hypotension post surgery and has remained in CCU with low BP.    Clinical Impression  Pt continues to have low BP (<100/60 consistently since surgery).  Spoke with ortho surgeon with okay to do bed exercises and mobility, but to hold off on standing, etc until BP is more appropriate.  Pt eager to work with PT and actually did very well with most tasks.  He was able to perform bed exercises generally with good quality of motion and with light resistance for most of them.  ROM was painful, but he was able to achieve ~75-80.  He was able to get up to EOB w/o direct assist though he was slow and labored with the effort, did need assist to get R LE back up into bed and to scoot in bed.      Follow Up Recommendations SNF    Equipment Recommendations  (TBD at next venue of care)    Recommendations for Other Services       Precautions / Restrictions Precautions Precautions: Knee;Fall Precaution Comments: per ortho, hold standing up until BPs improve Restrictions Weight Bearing Restrictions: Yes RLE Weight Bearing: Weight bearing as tolerated      Mobility  Bed Mobility Overal bed mobility: Needs Assistance Bed Mobility: Supine to Sit;Sit to Supine     Supine to sit: Min guard Sit to supine: Min assist   General bed mobility comments: Pt with labored effort to get to sitting, but able to rise w/o assist and with only very minimal c/o lightheadedness, etc that quickly subsided.  Pt did need assist in getting R LE back up into bed and to scoot up in bed for repositioning  Transfers                 General transfer comment: deferred 2/2 continued low BP, but pt eager to get up when medically appropriate  Ambulation/Gait                Stairs             Wheelchair Mobility    Modified Rankin (Stroke Patients Only)       Balance Overall balance assessment: Modified Independent                                           Pertinent Vitals/Pain Pain Assessment: 0-10 Pain Score: 6 (has not been able to get a lot of pain meds 2/2 BP) Pain Location: R knee    Home Living Family/patient expects to be discharged to:: Skilled nursing facility Living Arrangements: Other relatives(daughter and granddaugher) Available Help at Discharge: Family;Available 24 hours/day Type of Home: House Home Access: Ramped entrance     Home Layout: One level Home Equipment: Walker - 4 wheels(has 2 (smaller one in the home, larger for OOH/MD appts))      Prior Function Level of Independence: Independent with assistive device(s)         Comments: Pt reports he is only out of the home for MD appointments, can manage in the home     Hand Dominance        Extremity/Trunk Assessment   Upper Extremity Assessment Upper Extremity Assessment: Overall WFL for tasks  assessed    Lower Extremity Assessment Lower Extremity Assessment: Overall WFL for tasks assessed(expected R LE weakness post-op, but AROM in all planes)       Communication   Communication: No difficulties  Cognition Arousal/Alertness: Awake/alert Behavior During Therapy: WFL for tasks assessed/performed Overall Cognitive Status: Within Functional Limits for tasks assessed                                 General Comments: Pt very pleasant and eager to participate with PT      General Comments      Exercises Total Joint Exercises Ankle Circles/Pumps: AROM;15 reps Quad Sets: Strengthening;10 reps Heel Slides: AROM;10 reps Hip ABduction/ADduction: Strengthening;10 reps Straight Leg Raises: AROM;10 reps Knee Flexion: PROM;5 reps Goniometric ROM: 1-75   Assessment/Plan    PT Assessment Patient needs continued PT services  PT Problem List  Decreased strength;Decreased range of motion;Decreased activity tolerance;Decreased balance;Decreased mobility;Decreased coordination;Decreased cognition;Decreased knowledge of use of DME;Decreased safety awareness;Decreased knowledge of precautions;Pain       PT Treatment Interventions DME instruction;Gait training;Functional mobility training;Therapeutic activities;Therapeutic exercise;Balance training;Neuromuscular re-education;Patient/family education    PT Goals (Current goals can be found in the Care Plan section)  Acute Rehab PT Goals Patient Stated Goal: go home PT Goal Formulation: With patient Time For Goal Achievement: 03/20/19 Potential to Achieve Goals: Good    Frequency BID   Barriers to discharge        Co-evaluation               AM-PAC PT "6 Clicks" Mobility  Outcome Measure Help needed turning from your back to your side while in a flat bed without using bedrails?: A Little Help needed moving from lying on your back to sitting on the side of a flat bed without using bedrails?: A Little Help needed moving to and from a bed to a chair (including a wheelchair)?: A Lot Help needed standing up from a chair using your arms (e.g., wheelchair or bedside chair)?: A Lot Help needed to walk in hospital room?: Total Help needed climbing 3-5 steps with a railing? : Total 6 Click Score: 12    End of Session Equipment Utilized During Treatment: Gait belt Activity Tolerance: Patient tolerated treatment well Patient left: in bed;with SCD's reapplied;with call bell/phone within reach Nurse Communication: Mobility status PT Visit Diagnosis: Muscle weakness (generalized) (M62.81);Difficulty in walking, not elsewhere classified (R26.2);Pain Pain - Right/Left: Right Pain - part of body: Knee    Time: 1779-3903 PT Time Calculation (min) (ACUTE ONLY): 39 min   Charges:   PT Evaluation $PT Eval Low Complexity: 1 Low PT Treatments $Therapeutic Exercise: 8-22  mins $Therapeutic Activity: 8-22 mins        Kreg Shropshire, DPT 03/06/2019, 3:38 PM

## 2019-03-06 NOTE — Anesthesia Postprocedure Evaluation (Signed)
Anesthesia Post Note  Patient: Curtis Alexander  Procedure(s) Performed: COMPUTER ASSISTED TOTAL KNEE ARTHROPLASTY (Right Knee)  Patient location during evaluation: SICU Anesthesia Type: Spinal Level of consciousness: awake and alert Pain management: pain level controlled Vital Signs Assessment: post-procedure vital signs reviewed and stable Respiratory status: spontaneous breathing and nonlabored ventilation Cardiovascular status: stable Postop Assessment: no apparent nausea or vomiting, no headache, no backache and spinal receding Anesthetic complications: no     Last Vitals:  Vitals:   03/06/19 0800 03/06/19 0900  BP: (!) 86/48 (!) 90/33  Pulse: (!) 57 71  Resp: 12 18  Temp:    SpO2: 100% 100%    Last Pain:  Vitals:   03/06/19 0730  TempSrc: Oral  PainSc:                  Curtis Alexander

## 2019-03-07 DIAGNOSIS — R8281 Pyuria: Secondary | ICD-10-CM

## 2019-03-07 DIAGNOSIS — Z8719 Personal history of other diseases of the digestive system: Secondary | ICD-10-CM

## 2019-03-07 DIAGNOSIS — D696 Thrombocytopenia, unspecified: Secondary | ICD-10-CM

## 2019-03-07 DIAGNOSIS — D649 Anemia, unspecified: Secondary | ICD-10-CM

## 2019-03-07 DIAGNOSIS — I9581 Postprocedural hypotension: Secondary | ICD-10-CM

## 2019-03-07 LAB — CBC WITH DIFFERENTIAL/PLATELET
Abs Immature Granulocytes: 0.02 10*3/uL (ref 0.00–0.07)
Basophils Absolute: 0 10*3/uL (ref 0.0–0.1)
Basophils Relative: 0 %
Eosinophils Absolute: 0.1 10*3/uL (ref 0.0–0.5)
Eosinophils Relative: 2 %
HCT: 30.9 % — ABNORMAL LOW (ref 39.0–52.0)
Hemoglobin: 9.9 g/dL — ABNORMAL LOW (ref 13.0–17.0)
Immature Granulocytes: 0 %
Lymphocytes Relative: 24 %
Lymphs Abs: 1.2 10*3/uL (ref 0.7–4.0)
MCH: 31.3 pg (ref 26.0–34.0)
MCHC: 32 g/dL (ref 30.0–36.0)
MCV: 97.8 fL (ref 80.0–100.0)
Monocytes Absolute: 0.6 10*3/uL (ref 0.1–1.0)
Monocytes Relative: 12 %
Neutro Abs: 3 10*3/uL (ref 1.7–7.7)
Neutrophils Relative %: 62 %
Platelets: 82 10*3/uL — ABNORMAL LOW (ref 150–400)
RBC: 3.16 MIL/uL — ABNORMAL LOW (ref 4.22–5.81)
RDW: 13.4 % (ref 11.5–15.5)
WBC: 4.9 10*3/uL (ref 4.0–10.5)
nRBC: 0 % (ref 0.0–0.2)

## 2019-03-07 LAB — MAGNESIUM: Magnesium: 1.8 mg/dL (ref 1.7–2.4)

## 2019-03-07 LAB — COMPREHENSIVE METABOLIC PANEL
ALT: 9 U/L (ref 0–44)
AST: 15 U/L (ref 15–41)
Albumin: 2.7 g/dL — ABNORMAL LOW (ref 3.5–5.0)
Alkaline Phosphatase: 74 U/L (ref 38–126)
Anion gap: 6 (ref 5–15)
BUN: 13 mg/dL (ref 8–23)
CO2: 25 mmol/L (ref 22–32)
Calcium: 7.8 mg/dL — ABNORMAL LOW (ref 8.9–10.3)
Chloride: 113 mmol/L — ABNORMAL HIGH (ref 98–111)
Creatinine, Ser: 0.62 mg/dL (ref 0.61–1.24)
GFR calc Af Amer: >60 mL/min (ref >60)
GFR calc non Af Amer: >60 mL/min (ref >60)
Glucose, Bld: 105 mg/dL — ABNORMAL HIGH (ref 70–99)
Potassium: 3.9 mmol/L (ref 3.5–5.1)
Sodium: 144 mmol/L (ref 135–145)
Total Bilirubin: 0.3 mg/dL (ref 0.3–1.2)
Total Protein: 4.9 g/dL — ABNORMAL LOW (ref 6.5–8.1)

## 2019-03-07 LAB — URINE CULTURE
Culture: <10000 — AB
Special Requests: NORMAL

## 2019-03-07 LAB — PROCALCITONIN: Procalcitonin: <0.1 ng/mL

## 2019-03-07 LAB — CORTISOL: Cortisol, Plasma: 8.4 ug/dL

## 2019-03-07 MED ORDER — PANTOPRAZOLE SODIUM 40 MG PO TBEC
40.0000 mg | DELAYED_RELEASE_TABLET | Freq: Every day | ORAL | Status: DC
  Administered 2019-03-08 – 2019-03-11 (×4): 40 mg via ORAL
  Filled 2019-03-07 (×4): qty 1

## 2019-03-07 NOTE — Progress Notes (Signed)
OT Cancellation Note  Patient Details Name: Donjuan Robison MRN: [id number] DOB: 1946-07-14   Cancelled Treatment:    Reason Eval/Treat Not Completed: Other (comment). Upon attempt, pt working with PT. Will re-attempt OT treatment at later date/time as pt is available and as schedule permits.   Jeni Salles, MPH, MS, OTR/L ascom [phone] 03/07/19, 3:11 PM

## 2019-03-07 NOTE — Progress Notes (Signed)
ID Pt doing well Working with PT No fever No cough or sob  BP 115/66   Pulse 92   Temp 98.6 F (37 C) (Oral)   Resp (!) 21   Ht 5\' 8"  (1.727 m)   Wt 126.8 kg   SpO2 97%   BMI 42.50 kg/m    O/E Awake alert no distress Chest b/l air entry HS s1s2 Foley in place Rt knee surgical dressing     CBC Latest Ref Rng & Units 03/07/2019 03/06/2019 03/06/2019  WBC 4.0 - 10.5 K/uL 4.9 6.2 -  Hemoglobin 13.0 - 17.0 g/dL 9.9(L) 10.9(L) 10.6(L)  Hematocrit 39.0 - 52.0 % 30.9(L) 34.2(L) 33.3(L)  Platelets 150 - 400 K/uL 82(L) 99(L) -    CMP Latest Ref Rng & Units 03/07/2019 03/06/2019 02/26/2019  Glucose 70 - 99 mg/dL 105(H) 118(H) 92  BUN 8 - 23 mg/dL 13 18 22   Creatinine 0.61 - 1.24 mg/dL 0.62 0.84 0.75  Sodium 135 - 145 mmol/L 144 141 142  Potassium 3.5 - 5.1 mmol/L 3.9 4.4 4.1  Chloride 98 - 111 mmol/L 113(H) 109 108  CO2 22 - 32 mmol/L 25 26 24   Calcium 8.9 - 10.3 mg/dL 7.8(L) 8.1(L) 8.6(L)  Total Protein 6.5 - 8.1 g/dL 4.9(L) - 6.2(L)  Total Bilirubin 0.3 - 1.2 mg/dL 0.3 - 0.5  Alkaline Phos 38 - 126 U/L 74 - 98  AST 15 - 41 U/L 15 - 22  ALT 0 - 44 U/L 9 - 20   Impression/recommendation 73 year old male with history of osteoarthritis underwent right knee arthroplasty on 03/05/2019.  Urinalysis after that he was hypotensive and was shifted to stepdown.  Hypotension following surgery.  Likely hypovolemia.  He is off pressors now.  There was  concern for sepsis because of ESBL E. coli in the urine found on 02/24/2019.  But he looked well with no fever or white count and procalcitonin was repeatedly normal.  The ESBL E. coli prior to the surgery was likely colonizing the bladder as he has BPH and possibly has residual urine.  As he had a coud placed during the surgery because of an urethral stricture, even though it was not traumatic and now he has a Foley, there was a concern for UTI and he is on meropenem.   Blood culture has been negative so far and urine culture has less than 10,000  colonies.  Procalcitonin again is normal. So we can discontinue meropenem after today. Most importantly we need to make sure after the Foley is removed to get a bladder scan to look for any post void bladder residue  Right TKA.  Anemia likely due to op loss.  Thrombocytopenia: Avoid drugs that can contribute.  Like heparin PPI.  History of gastric bypass.  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  Discussed the management with the patient. ID will follow peripherally this weekend.  Call if needed.

## 2019-03-07 NOTE — Progress Notes (Signed)
Physical Therapy Treatment Patient Details Name: Curtis Alexander MRN: [id number] DOB: 1946/06/30 Today's Date: 03/07/2019    History of Present Illness 73 y/o male s/p R TKA 03/05/19.  Pt had issues with hypotension post surgery and has remained in CCU with low BP.      PT Comments    Pt eager to work with PT, though he needs consistent cuing and redirection t/o the session to stay on tasks with exercises and insure appropriate execution.  He showed great effort with tasks but was sore and needed more warm up/break-in time with each exercise today as per yesterday's stiffness/pain.  Deferred mobility today as HR was >100 t/o the session and at times into the 120s with modest activity. BP is looking better, still on drip.    Follow Up Recommendations  SNF     Equipment Recommendations  Rolling walker with 5" wheels    Recommendations for Other Services       Precautions / Restrictions Precautions Precautions: Knee;Fall Restrictions RLE Weight Bearing: Weight bearing as tolerated    Mobility  Bed Mobility               General bed mobility comments: deferred HR >100 t/o session with 120s at times during modest supine exercises  Transfers                    Ambulation/Gait                 Stairs             Wheelchair Mobility    Modified Rankin (Stroke Patients Only)       Balance Overall balance assessment: Modified Independent                                          Cognition Arousal/Alertness: Awake/alert Behavior During Therapy: WFL for tasks assessed/performed Overall Cognitive Status: Within Functional Limits for tasks assessed                                        Exercises Total Joint Exercises Ankle Circles/Pumps: Strengthening;15 reps Quad Sets: Strengthening;15 reps Short Arc Quad: AROM;Strengthening;10 reps Heel Slides: AROM;10 reps Hip ABduction/ADduction: Strengthening;15  reps Straight Leg Raises: AROM;10 reps(first few reps needed light AAROM to perform) Knee Flexion: PROM;5 reps Goniometric ROM: 0-85    General Comments        Pertinent Vitals/Pain Pain Assessment: 0-10 Pain Score: 4  Pain Location: R knee pain increases with PROM, but considering lack of pain meds he is relatively comfortable    Home Living                      Prior Function            PT Goals (current goals can now be found in the care plan section) Progress towards PT goals: Progressing toward goals    Frequency    BID      PT Plan Current plan remains appropriate    Co-evaluation              AM-PAC PT "6 Clicks" Mobility   Outcome Measure  Help needed turning from your back to your side while in a flat bed without using bedrails?: A Little Help needed moving  from lying on your back to sitting on the side of a flat bed without using bedrails?: A Little Help needed moving to and from a bed to a chair (including a wheelchair)?: A Lot Help needed standing up from a chair using your arms (e.g., wheelchair or bedside chair)?: A Lot Help needed to walk in hospital room?: Total Help needed climbing 3-5 steps with a railing? : Total 6 Click Score: 12    End of Session Equipment Utilized During Treatment: Gait belt Activity Tolerance: Patient tolerated treatment well Patient left: in bed;with call bell/phone within reach   PT Visit Diagnosis: Muscle weakness (generalized) (M62.81);Difficulty in walking, not elsewhere classified (R26.2);Pain Pain - Right/Left: Right Pain - part of body: Knee     Time: 2229-7989 PT Time Calculation (min) (ACUTE ONLY): 40 min  Charges:  $Therapeutic Exercise: 38-52 mins                     Kreg Shropshire, DPT 03/07/2019, 12:58 PM

## 2019-03-07 NOTE — NC FL2 (Signed)
[place] LEVEL OF CARE SCREENING TOOL     IDENTIFICATION  Patient Name: Curtis Alexander Birthdate: [birth year] Sex: male Admission Date (Current Location): 03/05/2019  [place] and [place] Number:  Engineering geologist and Address:  [facility], [address], [place], [place] [address]      Provider Number: [id number]  Attending Physician Name and Address:  Dereck Leep, MD  Relative Name and Phone Number:       Current Level of Care: Hospital Recommended Level of Care: [place] Prior Approval Number:    Date Approved/Denied:   PASRR Number: Manual review  Discharge Plan: SNF    Current Diagnoses: Patient Active Problem List   Diagnosis Date Noted  . 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 Essential tremor 03/04/2019  . Gout 03/04/2019  . Intestinal malabsorption 09/29/2018  . 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 Lumbar herniated disc 12/17/2012  . Nephrolithiasis 06/11/2012  . Anxiety disorder 03/12/2012  . Status post abdominoplasty 04/30/2007  . Absence of bladder continence 11/04/2003  . Back pain 11/04/2003  . Depression 11/04/2003  . Diabetes mellitus, type II ([place]) 11/04/2003  . Hypertension 11/04/2003  . Insomnia 11/04/2003  . Morbid obesity ([place]) 11/04/2003  . Orthopnea 11/04/2003  . Peptic ulcer disease 11/04/2003  . Sleep apnea 11/04/2003  . Venous insufficiency 11/04/2003    Orientation RESPIRATION BLADDER Height & Weight     Self, Time, Situation, Place  Normal Continent, Indwelling catheter Weight: 279 lb 8.7 oz (126.8 kg) Height:  5\' 8"  (172.7 cm)  BEHAVIORAL SYMPTOMS/MOOD NEUROLOGICAL BOWEL NUTRITION STATUS    (None) Continent Diet(Carb modified)  AMBULATORY STATUS COMMUNICATION OF NEEDS Skin   Extensive Assist Verbally Surgical wounds                        Personal Care Assistance Level of Assistance  Bathing, Feeding, Dressing Bathing Assistance: Limited assistance Feeding assistance: Independent Dressing Assistance: Limited assistance     Functional Limitations Info  Sight, Hearing, Speech Sight Info: Adequate Hearing Info: Adequate Speech Info: Adequate    SPECIAL CARE FACTORS FREQUENCY  PT (By licensed PT), OT (By licensed OT)     PT Frequency: 5 x week OT Frequency: 5 x week            Contractures Contractures Info: Not present    Additional Factors Info  Code Status, Allergies, Isolation Precautions, Psychotropic Code Status Info: Full code Allergies Info: NKDA Psychotropic Info: Anxiety, Depression: Klonopin 0.5 mg PO BID, Remeron 45 mg PO QHS.   Isolation Precautions Info: Contact: ESBL     Current Medications (03/07/2019):  This is the current hospital active medication list Current Facility-Administered Medications  Medication Dose Route Frequency Provider Last Rate Last Dose  . acetaminophen (TYLENOL) tablet 325-650 mg  325-650 mg Oral Q6H PRN Hooten, Laurice Record, MD      . allopurinol (ZYLOPRIM) tablet 300 mg  300 mg Oral Q lunch Hooten, Laurice Record, MD   300 mg at 03/07/19 1220  . alum & mag hydroxide-simeth (MAALOX/MYLANTA) 200-200-20 MG/5ML suspension 30 mL  30 mL Oral Q4H PRN Hooten, Laurice Record, MD      . bisacodyl (DULCOLAX) suppository 10 mg  10 mg Rectal Daily PRN Hooten, Laurice Record, MD      . brimonidine (ALPHAGAN) 0.2 % ophthalmic solution 1 drop  1 drop Right Eye BID  Dereck Leep, MD   1 drop at 03/07/19 2536  . celecoxib (CELEBREX) capsule 200 mg  200 mg Oral BID Dereck Leep, MD   200 mg at 03/07/19 0918  . Chlorhexidine Gluconate Cloth 2 % PADS 6 each  6 each Topical Q0600 Bradly Bienenstock, NP   6 each at 03/07/19 0542  . cholecalciferol (VITAMIN D) tablet 2,000 Units  2,000 Units Oral Daily Dereck Leep, MD   2,000 Units at 03/07/19 [phone]  . clonazePAM (KLONOPIN) tablet 0.5 mg  0.5 mg Oral BID  Dereck Leep, MD   0.5 mg at 03/07/19 0918  . cyclobenzaprine (FLEXERIL) tablet 10 mg  10 mg Oral TID Dereck Leep, MD   10 mg at 03/07/19 0916  . diphenhydrAMINE (BENADRYL) 12.5 MG/5ML elixir 12.5-25 mg  12.5-25 mg Oral Q4H PRN Hooten, Laurice Record, MD      . enoxaparin (LOVENOX) injection 40 mg  40 mg Subcutaneous Q12H Chance, Munter, RPH   40 mg at 03/07/19 0742  . erythromycin ophthalmic ointment 1 application  1 application Left Eye QHS PRN Hooten, Laurice Record, MD      . ferrous sulfate tablet 325 mg  325 mg Oral BID WC Hooten, Laurice Record, MD   325 mg at 03/07/19 0742  . finasteride (PROSCAR) tablet 5 mg  5 mg Oral Q lunch Hooten, Laurice Record, MD   5 mg at 03/07/19 1220  . gabapentin (NEURONTIN) capsule 300 mg  300 mg Oral QHS Hooten, Laurice Record, MD   300 mg at 03/06/19 2142  . HYDROmorphone (DILAUDID) injection 0.5-1 mg  0.5-1 mg Intravenous Q4H PRN Hooten, Laurice Record, MD      . insulin aspart (novoLOG) injection 0-15 Units  0-15 Units Subcutaneous TID WC Hooten, Laurice Record, MD      . insulin aspart (novoLOG) injection 0-5 Units  0-5 Units Subcutaneous QHS Tyler Pita, MD      . latanoprost (XALATAN) 0.005 % ophthalmic solution 1 drop  1 drop Right Eye QHS Hooten, Laurice Record, MD   1 drop at 03/06/19 2305  . magnesium hydroxide (MILK OF MAGNESIA) suspension 30 mL  30 mL Oral Daily Hooten, Laurice Record, MD   30 mL at 03/06/19 1040  . menthol-cetylpyridinium (CEPACOL) lozenge 3 mg  1 lozenge Oral PRN Hooten, Laurice Record, MD       Or  . phenol (CHLORASEPTIC) mouth spray 1 spray  1 spray Mouth/Throat PRN Hooten, Laurice Record, MD      . meropenem (MERREM) 1 g in sodium chloride 0.9 % 100 mL IVPB  1 g Intravenous Q8H Ravishankar, Jayashree, MD 200 mL/hr at 03/07/19 1338 1 g at 03/07/19 1338  . metoCLOPramide (REGLAN) tablet 5-10 mg  5-10 mg Oral Q8H PRN Hooten, Laurice Record, MD       Or  . metoCLOPramide (REGLAN) injection 5-10 mg  5-10 mg Intravenous Q8H PRN Hooten, Laurice Record, MD      . mirtazapine (REMERON) tablet 45 mg  45 mg  Oral QHS Hooten, Laurice Record, MD   45 mg at 03/06/19 2142  . ondansetron (ZOFRAN) injection 4 mg  4 mg Intravenous Q6H PRN Hooten, Laurice Record, MD      . oxyCODONE (Oxy IR/ROXICODONE) immediate release tablet 10 mg  10 mg Oral Q4H PRN Hooten, Laurice Record, MD      . oxyCODONE (Oxy IR/ROXICODONE) immediate release tablet 5 mg  5 mg Oral Q4H PRN Hooten, Laurice Record, MD   5  mg at 03/05/19 2004  . [START ON 03/08/2019] pantoprazole (PROTONIX) EC tablet 40 mg  40 mg Oral Daily Wilhelmina Mcardle, MD      . phenylephrine (NEO-SYNEPHRINE) 10 mg in sodium chloride 0.9 % 250 mL (0.04 mg/mL) infusion  0-400 mcg/min Intravenous Titrated Bradly Bienenstock, NP   Stopped at 03/07/19 1218  . primidone (MYSOLINE) tablet 50 mg  50 mg Oral BID Dereck Leep, MD   50 mg at 03/07/19 0917  . senna-docusate (Senokot-S) tablet 1 tablet  1 tablet Oral BID Dereck Leep, MD   1 tablet at 03/07/19 [phone]  . sodium phosphate (FLEET) 7-19 GM/118ML enema 1 enema  1 enema Rectal Once PRN Hooten, Laurice Record, MD      . tamsulosin (FLOMAX) capsule 0.4 mg  0.4 mg Oral QHS Hooten, Laurice Record, MD   0.4 mg at 03/06/19 2142  . topiramate (TOPAMAX) tablet 100 mg  100 mg Oral Q breakfast Hooten, Laurice Record, MD   100 mg at 03/07/19 0742  . topiramate (TOPAMAX) tablet 50 mg  50 mg Oral Q lunch Hooten, Laurice Record, MD   50 mg at 03/07/19 1220  . traMADol (ULTRAM) tablet 50-100 mg  50-100 mg Oral Q4H PRN Dereck Leep, MD   100 mg at 03/07/19 1440  . vitamin B-12 (CYANOCOBALAMIN) tablet 2,500 mcg  2,500 mcg Oral Daily Dereck Leep, MD   2,500 mcg at 03/07/19 [phone]  . vitamin C (ASCORBIC ACID) tablet 1,000 mg  1,000 mg Oral Daily Hooten, Laurice Record, MD   1,000 mg at 03/07/19 2244  . zolpidem (AMBIEN) tablet 5 mg  5 mg Oral QHS Hooten, Laurice Record, MD         Discharge Medications: Please see discharge summary for a list of discharge medications.  Relevant Imaging Results:  Relevant Lab Results:   Additional Information SS#: [id number]  Candie Chroman,  LCSW

## 2019-03-07 NOTE — Progress Notes (Signed)
Physical Therapy Treatment Patient Details Name: Curtis Alexander MRN: [id number] DOB: Jun 07, 1946 Today's Date: 03/07/2019    History of Present Illness 73 y/o male s/p R TKA 03/05/19.  Pt had issues with hypotension post surgery and has remained in CCU with low BP.      PT Comments    Pt was able to do 2 bouts of ambulation this session and despite having accelerated HR and some fatigue he was relatively safe.  He did not have any buckling and was able to take reasonable length steps without shuffling.  He showed great effort the entire session with exercises, mobility and ambulation and again was very eager to do all he could to continue recovery post TKA.  Pt with good quad engagement with most acts and is not having excessive post-op pain despite minimal pain meds.  Pt's BP has been better (even off drip), however HR was generally <100 and increased to 130-140s with activity.  Nursing present t/o most of session helping with equipment management, watching vitals, etc.     Follow Up Recommendations  SNF     Equipment Recommendations  Rolling walker with 5" wheels    Recommendations for Other Services       Precautions / Restrictions Precautions Precautions: Knee;Fall Restrictions RLE Weight Bearing: Weight bearing as tolerated    Mobility  Bed Mobility Overal bed mobility: Needs Assistance       Supine to sit: Min guard     General bed mobility comments: Pt able to get R LE to EOB w/o assist, heavy UE use but able to rise to sitting w/o assist  Transfers Overall transfer level: Needs assistance Equipment used: Rolling walker (2 wheeled) Transfers: Sit to/from Stand Sit to Stand: Min guard;Min assist         General transfer comment: 2 bouts of sit to stand, first attempt he needed only very light assist to get weight shifted forward, on second attempt with more focused cuing and set up he was able to rise with no direct assist at  all  Ambulation/Gait Ambulation/Gait assistance: Min assist Gait Distance (Feet): 10 Feet Assistive device: Rolling walker (2 wheeled)       General Gait Details: 2 bouts of walking with walker, 4ft then 63ft after seated rest.  He maintained forward flexed posture (which he reports is essentially baseline) but was able to take relatively consistent steps and maintain R WBing and knee extension without safety concerns. Overall pt did well and though he fatigued quickly (HR up to 130s on first attempt, 140s on second)     Stairs             Wheelchair Mobility    Modified Rankin (Stroke Patients Only)       Balance Overall balance assessment: Needs assistance   Sitting balance-Leahy Scale: Good       Standing balance-Leahy Scale: Fair Standing balance comment: pt highly reliant on walker, no overt safety issues or LOBs but needing close CGA t/o the effort                            Cognition Arousal/Alertness: Awake/alert Behavior During Therapy: WFL for tasks assessed/performed Overall Cognitive Status: Within Functional Limits for tasks assessed  Exercises Total Joint Exercises Ankle Circles/Pumps: Strengthening;10 reps Quad Sets: Strengthening;15 reps Short Arc Quad: AROM;Strengthening;10 reps Heel Slides: 10 reps;AAROM Hip ABduction/ADduction: Strengthening;10 reps Straight Leg Raises: AROM;10 reps Long Arc Quad: AROM;10 reps;Strengthening Knee Flexion: PROM;5 reps    General Comments        Pertinent Vitals/Pain Pain Assessment: 0-10 Pain Score: 6  Pain Location: R knee pain increases with PROM, but considering lack of pain meds he is relatively comfortable    Home Living                      Prior Function            PT Goals (current goals can now be found in the care plan section) Progress towards PT goals: Progressing toward goals    Frequency    BID       PT Plan Current plan remains appropriate    Co-evaluation              AM-PAC PT "6 Clicks" Mobility   Outcome Measure  Help needed turning from your back to your side while in a flat bed without using bedrails?: A Little Help needed moving from lying on your back to sitting on the side of a flat bed without using bedrails?: A Little Help needed moving to and from a bed to a chair (including a wheelchair)?: A Little Help needed standing up from a chair using your arms (e.g., wheelchair or bedside chair)?: A Little Help needed to walk in hospital room?: A Lot Help needed climbing 3-5 steps with a railing? : Total 6 Click Score: 15    End of Session Equipment Utilized During Treatment: Gait belt Activity Tolerance: Patient limited by fatigue;Patient tolerated treatment well Patient left: in chair;with call bell/phone within reach;with nursing/sitter in room Nurse Communication: Mobility status(positioning for knee extension) PT Visit Diagnosis: Muscle weakness (generalized) (M62.81);Difficulty in walking, not elsewhere classified (R26.2);Pain Pain - Right/Left: Right Pain - part of body: Knee     Time: 3729-0211 PT Time Calculation (min) (ACUTE ONLY): 47 min  Charges:  $Gait Training: 8-22 mins $Therapeutic Exercise: 8-22 mins $Therapeutic Activity: 8-22 mins                     Kreg Shropshire, DPT 03/07/2019, 4:22 PM

## 2019-03-07 NOTE — Progress Notes (Signed)
Patient transferred to room 146. Asked patient if he wanted the RN to call any family and the patient said, no, he can call them in the morning.

## 2019-03-07 NOTE — Progress Notes (Signed)
Pharmacy Antibiotic Note  Curtis Alexander is a 73 y.o. male admitted on 03/05/2019 for total right knee arthroplasty. Patient admitted to ICU on 8/13 requiring pressors secondary to postoperative hypovolemia. 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 According to ID consult, patient has been afebrile and his WBC count and PCT are normal. It's not likely to be sepsis but recommend treating with meropenem for 48 hours until cultures return. Pharmacy has been consulted for meropenem dosing.   Plan: Continue meropenem 1g Q8H (this is Day 2 of therapy).  Height: 5\' 8"  (172.7 cm) Weight: 279 lb 8.7 oz (126.8 kg) IBW/kg (Calculated) : 68.4  Temp (24hrs), Avg:98 F (36.7 C), Min:97.5 F (36.4 C), Max:98.5 F (36.9 C)  Recent Labs  Lab 03/06/19 0651 03/07/19 0438  WBC 6.2 4.9  CREATININE 0.84 0.62    Estimated Creatinine Clearance: 103.6 mL/min (by C-G formula based on SCr of 0.62 mg/dL).    No Known Allergies  Antimicrobials this admission: Cefazolin (surgical prophylaxis) 8/12>>8/13 Vancomycin 8/13 x 1 Zosyn 8/13 x 1 Meropenem 8/13 >>  Dose adjustments this admission: N/A  Microbiology results: 8/5 UCx: ESBL Ecoli 8/13 BCx: no growth x 1 day 8/13 UCx: <10,000 colonies/mL; insignificant growth 8/5 PCR: Staph negative; MRSA Negative  8/13 MRSA PCR: negative   Thank you for allowing pharmacy to be a part of this patient's care.  [place] Kappes Dear Nicholes Mango 03/07/2019 11:47 AM

## 2019-03-07 NOTE — Progress Notes (Signed)
  Subjective: 2 Days Post-Op Procedure(s) (LRB): COMPUTER ASSISTED TOTAL KNEE ARTHROPLASTY (Right) Patient reports pain as mild to moderate.   Patient seen in rounds with Dr. Marry Guan. Patient is currently in ICU.  His blood pressure is slowly improving with Neo-Synephrine drip.  His hemoglobin this morning is 121/73.  The patient is doing better with pain control.  He slept better last night.  The patient is not having as much lethargy this morning. Plan is to go Home vs Rehab after hospital stay. Negative for chest pain and shortness of breath Fever: no Gastrointestinal:Negative for nausea and vomiting  Objective: Vital signs in last 24 hours: Temp:  [97.5 F (36.4 C)-98.5 F (36.9 C)] 97.5 F (36.4 C) (08/14 0200) Pulse Rate:  [57-111] 91 (08/14 0700) Resp:  [11-27] 27 (08/14 0700) BP: (75-123)/(33-92) 121/73 (08/14 0700) SpO2:  [87 %-100 %] 100 % (08/14 0700)  Intake/Output from previous day:  Intake/Output Summary (Last 24 hours) at 03/07/2019 0718 Last data filed at 03/07/2019 0700 Gross per 24 hour  Intake 4175.27 ml  Output 6480 ml  Net -2304.73 ml    Intake/Output this shift: No intake/output data recorded.  Labs: Recent Labs    03/06/19 0318 03/06/19 0651 03/07/19 0438  HGB 10.6* 10.9* 9.9*   Recent Labs    03/06/19 0651 03/07/19 0438  WBC 6.2 4.9  RBC 3.41* 3.16*  HCT 34.2* 30.9*  PLT 99* 82*   Recent Labs    03/06/19 0651 03/07/19 0438  NA 141 144  K 4.4 3.9  CL 109 113*  CO2 26 25  BUN 18 13  CREATININE 0.84 0.62  GLUCOSE 118* 105*  CALCIUM 8.1* 7.8*   No results for input(s): LABPT, INR in the last 72 hours.   EXAM General - Patient is Alert and Oriented Extremity - Neurovascular intact Sensation intact distally Compartment soft Dressing/Incision - clean, dry, with the Hemovac removed with no complication.  The Hemovac tubing was intact on removal. Motor Function - intact, moving foot and toes well on exam. Able to do a Straight Leg  Raise independently.  Past Medical History:  Diagnosis Date  . Arthritis   . Cervical spinal stenosis   . Diabetes mellitus without complication ([place])   . Essential tremor   . GERD (gastroesophageal reflux disease)   . History of hiatal hernia    fixed with gastric bypass surgery  . History of kidney stones   . Hypertension   . Hypoglycemia   . Sleep apnea    had gastric bypass and no longer uses cpap machine  . Spinal stenosis, lumbar     Assessment/Plan: 2 Days Post-Op Procedure(s) (LRB): COMPUTER ASSISTED TOTAL KNEE ARTHROPLASTY (Right) Active Problems:   Total knee replacement status  Estimated body mass index is 42.5 kg/m as calculated from the following:   Height as of this encounter: 5\' 8"  (1.727 m).   Weight as of this encounter: 126.8 kg. Advance diet  Up with Therapy when vitals stable. Discharge planning. Possible transfer to orthopedic floor today, depending on blood pressure. Appreciate medicine involvement.  DVT Prophylaxis - Lovenox, Foot Pumps and TED hose Weight-Bearing as tolerated to Right leg  [name], PA-C Orthopaedic Surgery 03/07/2019, 7:18 AM

## 2019-03-07 NOTE — TOC Initial Note (Signed)
Transition of Care [facility]) - Initial/Assessment Note    Patient Details  Name: Curtis Alexander MRN: [id number] Date of Birth: 17-Mar-1946  [facility] CM/SW Contact:    Candie Chroman, LCSW Phone Number: 03/07/2019, 4:00 PM  Clinical Narrative:  CSW met with patient. Daughter at bedside. CSW introduced role and explained that PT recommendations would be discussed. Patient is agreeable to SNF and said he is already set up with [facility] in [place]. Patient said his wife is in a facility in [place] and his daughter that lives up there is taking care of her. He did not want to add additional stress to her so he plans to go to SNF in [place] so the daughter that lives there can help with taking care of him. CSW spoke with admissions coordinator, Steffanie Dunn, and faxed referral. PASARR under manual review. No further concerns. CSW encouraged patient and his daughter to contact CSW as needed. CSW will continue to follow patient and his daughter for support and facilitate discharge to SNF once medically stable.               Expected Discharge Plan: Skilled Nursing Facility Barriers to Discharge: Continued Medical Work up, PASARR under manual review.   Patient Goals and CMS Choice     Choice offered to / list presented to : NA  Expected Discharge Plan and Services Expected Discharge Plan: [place] Choice: [place] Living arrangements for the past 2 months: Single Family Home                                      Prior Living Arrangements/Services Living arrangements for the past 2 months: Single Family Home Lives with:: Adult Children, Relatives Patient language and need for interpreter reviewed:: Yes        Need for Family Participation in Patient Care: Yes (Comment) Care giver support system in place?: Yes (comment)   Criminal Activity/Legal Involvement Pertinent to Current  Situation/Hospitalization: No - Comment as needed  Activities of Daily Living Home Assistive Devices/Equipment: Eyeglasses, Gilford Rile (specify type) ADL Screening (condition at time of admission) Patient's cognitive ability adequate to safely complete daily activities?: Yes Is the patient deaf or have difficulty hearing?: No Does the patient have difficulty seeing, even when wearing glasses/contacts?: No Does the patient have difficulty concentrating, remembering, or making decisions?: No Patient able to express need for assistance with ADLs?: Yes Does the patient have difficulty dressing or bathing?: No Independently performs ADLs?: Yes (appropriate for developmental age) Does the patient have difficulty walking or climbing stairs?: Yes Weakness of Legs: None Weakness of Arms/Hands: None  Permission Sought/Granted Permission sought to share information with : Facility Sport and exercise psychologist, Family Supports Permission granted to share information with : Yes, Verbal Permission Granted     Permission granted to share info w AGENCY: [facility]  Permission granted to share info w Relationship: Daughter     Emotional Assessment Appearance:: Appears stated age Attitude/Demeanor/Rapport: Engaged, Gracious Affect (typically observed): Accepting, Appropriate, Calm, Pleasant Orientation: : Oriented to Self, Oriented to Place, Oriented to  Time, Oriented to Situation Alcohol / Substance Use: Never Used Psych Involvement: No (comment)  Admission diagnosis:  PRIMARY OSTEOARTHRITIS OF RIGHT KNEE Patient Active Problem List   Diagnosis Date Noted  . 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 Essential tremor 03/04/2019  . Gout  03/04/2019  . Intestinal malabsorption 09/29/2018  . 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 Lumbar herniated disc 12/17/2012  . Nephrolithiasis 06/11/2012  .  Anxiety disorder 03/12/2012  . Status post abdominoplasty 04/30/2007  . Absence of bladder continence 11/04/2003  . Back pain 11/04/2003  . Depression 11/04/2003  . Diabetes mellitus, type II ([place]) 11/04/2003  . Hypertension 11/04/2003  . Insomnia 11/04/2003  . Morbid obesity ([place]) 11/04/2003  . Orthopnea 11/04/2003  . Peptic ulcer disease 11/04/2003  . Sleep apnea 11/04/2003  . Venous insufficiency 11/04/2003   PCP:  [name] Pharmacy:   [place], [place] [address] Phone: [phone] Fax: [phone]     Social Determinants of Health (SDOH) Interventions    Readmission Risk Interventions No flowsheet data found.

## 2019-03-07 NOTE — Progress Notes (Signed)
Patient has been asleep most of the shift after taking HS medications. Has sleep apnea, but was able to stay off oxygen during the night. Ice and water in polar care changed out twice since midnight. No complaints or concerns. Has rested without incident so far this shift.

## 2019-03-07 NOTE — Progress Notes (Signed)
Seen earlier this morning.  He is in no distress.  He has been weaned off of vasopressors.  Physical therapy is working with him.  He has no new complaints.  Vitals:   03/07/19 1445 03/07/19 1500 03/07/19 1600 03/07/19 1640  BP: 105/72 (!) 116/55 110/66   Pulse: (!) 25 (!) 109 97   Resp: (!) 25 (!) 24 16   Temp:    98.8 F (37.1 C)  TempSrc:    Oral  SpO2: 90% 100% 98%   Weight:      Height:      Room air  Gen: NAD HEENT: NCAT, sclerae white Neck: No JVD Lungs: breath sounds full, no wheezes or other adventitious sounds Cardiovascular: RRR, no murmurs Abdomen: Soft, nontender, normal BS Ext: without clubbing, cyanosis, edema Neuro: grossly intact Skin: Limited exam, no lesions noted   BMP Latest Ref Rng & Units 03/07/2019 03/06/2019 02/26/2019  Glucose 70 - 99 mg/dL 105(H) 118(H) 92  BUN 8 - 23 mg/dL 13 18 22   Creatinine 0.61 - 1.24 mg/dL 0.62 0.84 0.75  Sodium 135 - 145 mmol/L 144 141 142  Potassium 3.5 - 5.1 mmol/L 3.9 4.4 4.1  Chloride 98 - 111 mmol/L 113(H) 109 108  CO2 22 - 32 mmol/L 25 26 24   Calcium 8.9 - 10.3 mg/dL 7.8(L) 8.1(L) 8.6(L)    CBC Latest Ref Rng & Units 03/07/2019 03/06/2019 03/06/2019  WBC 4.0 - 10.5 K/uL 4.9 6.2 -  Hemoglobin 13.0 - 17.0 g/dL 9.9(L) 10.9(L) 10.6(L)  Hematocrit 39.0 - 52.0 % 30.9(L) 34.2(L) 33.3(L)  Platelets 150 - 400 K/uL 82(L) 99(L) -   CXR: No new film  IMPRESSION: Hypotension, likely hypovolemic shock.  Resolved  Pyuria with history of ESBL  S/p R knee arthroplasty  PLAN/REC: Change to SDU status.  Advance diet and activity Antibiotic management per ID service Postop management per orthopedics  Merton Border, MD PCCM service Mobile [phone] Pager [phone] 03/07/2019 5:00 PM

## 2019-03-08 LAB — CBC
HCT: 29 % — ABNORMAL LOW (ref 39.0–52.0)
Hemoglobin: 9.3 g/dL — ABNORMAL LOW (ref 13.0–17.0)
MCH: 31.5 pg (ref 26.0–34.0)
MCHC: 32.1 g/dL (ref 30.0–36.0)
MCV: 98.3 fL (ref 80.0–100.0)
Platelets: 87 10*3/uL — ABNORMAL LOW (ref 150–400)
RBC: 2.95 MIL/uL — ABNORMAL LOW (ref 4.22–5.81)
RDW: 13.6 % (ref 11.5–15.5)
WBC: 5 10*3/uL (ref 4.0–10.5)
nRBC: 0 % (ref 0.0–0.2)

## 2019-03-08 LAB — BASIC METABOLIC PANEL
Anion gap: 5 (ref 5–15)
BUN: 13 mg/dL (ref 8–23)
CO2: 27 mmol/L (ref 22–32)
Calcium: 8.1 mg/dL — ABNORMAL LOW (ref 8.9–10.3)
Chloride: 110 mmol/L (ref 98–111)
Creatinine, Ser: 0.61 mg/dL (ref 0.61–1.24)
GFR calc Af Amer: >60 mL/min (ref >60)
GFR calc non Af Amer: >60 mL/min (ref >60)
Glucose, Bld: 111 mg/dL — ABNORMAL HIGH (ref 70–99)
Potassium: 4 mmol/L (ref 3.5–5.1)
Sodium: 142 mmol/L (ref 135–145)

## 2019-03-08 LAB — PROCALCITONIN: Procalcitonin: <0.1 ng/mL

## 2019-03-08 MED ORDER — ENOXAPARIN SODIUM 40 MG/0.4ML ~~LOC~~ SOLN
40.0000 mg | Freq: Two times a day (BID) | SUBCUTANEOUS | Status: DC
  Administered 2019-03-08 – 2019-03-11 (×6): 40 mg via SUBCUTANEOUS
  Filled 2019-03-08 (×6): qty 0.4

## 2019-03-08 NOTE — Progress Notes (Addendum)
Physical Therapy Treatment Patient Details Name: Curtis Alexander MRN: [id number] DOB: [birth year] Today's Date: 03/08/2019    History of Present Illness 73 y/o male s/p R TKA 03/05/19.  Pt had issues with hypotension post surgery and has remained in CCU with low BP.      PT Comments    BP in supine 102/90 HR 117.  Retaken 106/71 HR 93.  Pt requesting to use commode.  To edge of bed with min a x 2.  Pt with some dizziness upon sitting but resolved.  He was able to stand with min a x 2 and transfer to commode at bedside.  Pt able to have medium formed BM.  Stood for care and while standing commode was removed and recliner brought up behind pt.  Post activity 125/73 HR 131.  Of note, dressing with small amount of drainage. During activity, active bleeding was noted and increased with time with fresh blood pooling in dressing.  Further ex was deferred and LE's elevated.  RN notified to check.  Pt continues to require +2 assist for safety with mobility.   Follow Up Recommendations  SNF     Equipment Recommendations  Rolling walker with 5" wheels;Other (comment)    Recommendations for Other Services       Precautions / Restrictions Precautions Precautions: Knee;Fall Precaution Comments: BP monitoring Restrictions Weight Bearing Restrictions: Yes RLE Weight Bearing: Weight bearing as tolerated    Mobility  Bed Mobility Overal bed mobility: Needs Assistance Bed Mobility: Supine to Sit     Supine to sit: Min assist;+2 for physical assistance        Transfers Overall transfer level: Needs assistance Equipment used: Rolling walker (2 wheeled) Transfers: Sit to/from Stand Sit to Stand: Min assist;+2 physical assistance            Ambulation/Gait Ambulation/Gait assistance: Min assist;+2 physical assistance Gait Distance (Feet): 3 Feet Assistive device: Rolling walker (2 wheeled) Gait Pattern/deviations: Step-to pattern;Trunk flexed;Decreased step length -  right;Decreased step length - left;Wide base of support Gait velocity: decreased       Stairs             Wheelchair Mobility    Modified Rankin (Stroke Patients Only)       Balance Overall balance assessment: Needs assistance   Sitting balance-Leahy Scale: Good       Standing balance-Leahy Scale: Fair Standing balance comment: pt highly reliant on walker, no overt safety issues or LOBs but needing close CGA t/o the effort                            Cognition Arousal/Alertness: Awake/alert Behavior During Therapy: WFL for tasks assessed/performed Overall Cognitive Status: Within Functional Limits for tasks assessed                                        Exercises Other Exercises Other Exercises: to commode per his request for BM    General Comments        Pertinent Vitals/Pain Pain Assessment: Faces Faces Pain Scale: Hurts little more Pain Descriptors / Indicators: Aching;Sore Pain Intervention(s): Limited activity within patient's tolerance;Monitored during session;Premedicated before session    Home Living                      Prior Function  PT Goals (current goals can now be found in the care plan section) Progress towards PT goals: Progressing toward goals    Frequency    BID      PT Plan Current plan remains appropriate    Co-evaluation              AM-PAC PT "6 Clicks" Mobility   Outcome Measure  Help needed turning from your back to your side while in a flat bed without using bedrails?: A Little Help needed moving from lying on your back to sitting on the side of a flat bed without using bedrails?: A Little Help needed moving to and from a bed to a chair (including a wheelchair)?: A Little Help needed standing up from a chair using your arms (e.g., wheelchair or bedside chair)?: A Little Help needed to walk in hospital room?: A Lot Help needed climbing 3-5 steps with a  railing? : Total 6 Click Score: 15    End of Session Equipment Utilized During Treatment: Gait belt Activity Tolerance: Patient tolerated treatment well Patient left: in chair;with call bell/phone within reach;with nursing/sitter in room Nurse Communication: Mobility status Pain - Right/Left: Right Pain - part of body: Knee     Time: 1610-9604 PT Time Calculation (min) (ACUTE ONLY): 31 min  Charges:  $Gait Training: 8-22 mins $Therapeutic Activity: 8-22 mins                    Chesley Noon, PTA 03/08/19, 12:53 PM

## 2019-03-08 NOTE — Progress Notes (Signed)
  Subjective: 3 Days Post-Op Procedure(s) (LRB): COMPUTER ASSISTED TOTAL KNEE ARTHROPLASTY (Right) Patient reports pain as mild to moderate.   Patient seen in rounds with Dr. Roland Rack. Patient is currently transferred to the orthopedic floor.  His blood pressure is improving.  The Neo-Synephrine drip was discontinued. Plan is to go Rehab after hospital stay. Negative for chest pain and shortness of breath Fever: no Gastrointestinal:Negative for nausea and vomiting  Objective: Vital signs in last 24 hours: Temp:  [97.7 F (36.5 C)-99.6 F (37.6 C)] 97.7 F (36.5 C) (08/14 2343) Pulse Rate:  [25-112] 101 (08/14 2343) Resp:  [16-31] 18 (08/14 2343) BP: (101-130)/(48-95) 109/64 (08/14 2343) SpO2:  [90 %-100 %] 99 % (08/14 2343)  Intake/Output from previous day:  Intake/Output Summary (Last 24 hours) at 03/08/2019 0705 Last data filed at 03/08/2019 0450 Gross per 24 hour  Intake 2081.82 ml  Output 3805 ml  Net -1723.18 ml    Intake/Output this shift: No intake/output data recorded.  Labs: Recent Labs    03/06/19 0318 03/06/19 0651 03/07/19 0438 03/08/19 0350  HGB 10.6* 10.9* 9.9* 9.3*   Recent Labs    03/07/19 0438 03/08/19 0350  WBC 4.9 5.0  RBC 3.16* 2.95*  HCT 30.9* 29.0*  PLT 82* 87*   Recent Labs    03/07/19 0438 03/08/19 0350  NA 144 142  K 3.9 4.0  CL 113* 110  CO2 25 27  BUN 13 13  CREATININE 0.62 0.61  GLUCOSE 105* 111*  CALCIUM 7.8* 8.1*   No results for input(s): LABPT, INR in the last 72 hours.   EXAM General - Patient is Alert and Oriented Extremity - Neurovascular intact Sensation intact distally Compartment soft Dressing/Incision - clean, dry, intact Motor Function - intact, moving foot and toes well on exam. Able to do a Straight Leg Raise independently.  Past Medical History:  Diagnosis Date  . Arthritis   . Cervical spinal stenosis   . Diabetes mellitus without complication ([place])   . Essential tremor   . GERD (gastroesophageal  reflux disease)   . History of hiatal hernia    fixed with gastric bypass surgery  . History of kidney stones   . Hypertension   . Hypoglycemia   . Sleep apnea    had gastric bypass and no longer uses cpap machine  . Spinal stenosis, lumbar     Assessment/Plan: 3 Days Post-Op Procedure(s) (LRB): COMPUTER ASSISTED TOTAL KNEE ARTHROPLASTY (Right) Active Problems:   Total knee replacement status  Estimated body mass index is 42.5 kg/m as calculated from the following:   Height as of this encounter: 5\' 8"  (1.727 m).   Weight as of this encounter: 126.8 kg. Advance diet  Up with Therapy when vitals stable. Discharge planning to rehab possible tomorrow or Monday. Appreciate medicine involvement.  DVT Prophylaxis - Lovenox, Foot Pumps and TED hose Weight-Bearing as tolerated to Right leg  [name], PA-C Orthopaedic Surgery 03/08/2019, 7:05 AM

## 2019-03-08 NOTE — Progress Notes (Signed)
Physical Therapy Treatment Patient Details Name: Curtis Alexander MRN: [id number] DOB: 18-Dec-1945 Today's Date: 03/08/2019    History of Present Illness 73 y/o male s/p R TKA 03/05/19.  Pt had issues with hypotension post surgery and initially in CCU with low BP.    PT Comments    Pt resting in recliner upon PT arrival and reporting 2/10 R knee pain at rest (also 2/10 pain end of session at rest).  Tolerated ex's fairly well.  R knee AAROM flexion to 85 degrees.  Pt with posterior loss of balance 1st attempt standing from chair (up to bariatric RW) requiring assist of therapist to safely sit back in recliner; pt requiring min to mod assist x1 to stand 2nd trial up to walker (assist to come to stand and also shift weight forward).  Able to take steps recliner to bed (with walker) but limited ambulation distance d/t HR increasing from 104 bpm at rest to 144 bpm with taking steps recliner to bed (and required a few minutes of rest to decrease HR back to resting HR).  Nurse notified of pt's HR as well as increased red drainage noted R LE distal honeycomb dressing.  Will continue to focus on strengthening, R knee ROM, and progressing functional mobility per pt tolerance.   Follow Up Recommendations  SNF     Equipment Recommendations  Rolling walker with 5" wheels;3in1 (PT)(bariatric)    Recommendations for Other Services       Precautions / Restrictions Precautions Precautions: Knee;Fall Precaution Comments: BP and HR monitoring Restrictions Weight Bearing Restrictions: Yes RLE Weight Bearing: Weight bearing as tolerated    Mobility  Bed Mobility Overal bed mobility: Needs Assistance Bed Mobility: Sit to Supine     Sit to supine: Min assist;Mod assist   General bed mobility comments: assist for R>L LE sit to supine; pt able to scoot up in bed on own with increased effort and bed flat  Transfers Overall transfer level: Needs assistance Equipment used: Rolling walker (2  wheeled) Transfers: Sit to/from Stand Sit to Stand: Min assist;Mod assist         General transfer comment: 1st trial standing from recliner pt almost to upright standing to bariatric RW when pt lost balance backwards requiring assist of therapist to safely sit back down in chair; 2nd trial standing therapist provided increased assist to shift weight forward and pt able to stand up to bariatric walker safely  Ambulation/Gait Ambulation/Gait assistance: Min guard;Min assist Gait Distance (Feet): 3 Feet(recliner to bed) Assistive device: Rolling walker (2 wheeled)(bariatric) Gait velocity: decreased   General Gait Details: forward flexed posture; decreased stance time R LE; limited distance d/t HR increase with activity; no knee buckling noted   Stairs             Wheelchair Mobility    Modified Rankin (Stroke Patients Only)       Balance Overall balance assessment: Needs assistance Sitting-balance support: No upper extremity supported;Feet supported Sitting balance-Leahy Scale: Good Sitting balance - Comments: steady sitting reaching within BOS     Standing balance-Leahy Scale: Poor Standing balance comment: pt requiring B UE support on walker for static standing balance                            Cognition Arousal/Alertness: Awake/alert Behavior During Therapy: WFL for tasks assessed/performed Overall Cognitive Status: Within Functional Limits for tasks assessed  General Comments: Pt very pleasant and eager to participate with PT      Exercises Total Joint Exercises Straight Leg Raises: AROM;Strengthening;Right;10 reps;Supine Long Arc Quad: AROM;Strengthening;Right;10 reps;Seated Knee Flexion: AAROM;Strengthening;Right;10 reps;Seated(paper towel under pt's foot to allow for sliding on floor) Goniometric ROM: R knee extension 2 degrees short of neutral semi-supine in chair; R knee flexion AAROM to 85  degrees sitting in recliner    General Comments   Nursing cleared pt for participation in physical therapy.  Pt agreeable to PT session.      Pertinent Vitals/Pain Pain Assessment: 0-10 Pain Score: 2  Faces Pain Scale: Hurts little more Pain Location: R knee pain Pain Descriptors / Indicators: Sore Pain Intervention(s): Limited activity within patient's tolerance;Monitored during session;Repositioned;Other (comment)(polar care applied and activated)  BP 119/56 beginning of session and 114/51 end of session.    Home Living                      Prior Function            PT Goals (current goals can now be found in the care plan section) Acute Rehab PT Goals Patient Stated Goal: go to rehab near family in [place] and then go home and continue with outpatient therapy PT Goal Formulation: With patient Time For Goal Achievement: 03/20/19 Potential to Achieve Goals: Good Progress towards PT goals: Progressing toward goals    Frequency    BID      PT Plan Current plan remains appropriate    Co-evaluation              AM-PAC PT "6 Clicks" Mobility   Outcome Measure  Help needed turning from your back to your side while in a flat bed without using bedrails?: A Little Help needed moving from lying on your back to sitting on the side of a flat bed without using bedrails?: A Little Help needed moving to and from a bed to a chair (including a wheelchair)?: A Lot Help needed standing up from a chair using your arms (e.g., wheelchair or bedside chair)?: A Lot Help needed to walk in hospital room?: A Little Help needed climbing 3-5 steps with a railing? : Total 6 Click Score: 14    End of Session Equipment Utilized During Treatment: Gait belt Activity Tolerance: Patient tolerated treatment well Patient left: in bed;with call bell/phone within reach;with bed alarm set;with SCD's reapplied(R heel in bone foam; L heel floating via pillow; polar care in place and  activated) Nurse Communication: Mobility status;Precautions;Weight bearing status;Other (comment)(drainage in pt's honeycomb dressing; pt's HR) PT Visit Diagnosis: Muscle weakness (generalized) (M62.81);Difficulty in walking, not elsewhere classified (R26.2);Pain Pain - Right/Left: Right Pain - part of body: Knee     Time: 1450-1530 PT Time Calculation (min) (ACUTE ONLY): 40 min  Charges:  $Therapeutic Exercise: 8-22 mins $Therapeutic Activity: 23-37 mins                    Leitha Bleak, PT 03/08/19, 4:37 PM [phone]

## 2019-03-08 NOTE — Progress Notes (Signed)
Patient refusing BG checks and Novolog, states he's not diabetic, that he was getting the metformin to prevent the low blood sugars he was having in the afternoons. He would like those orders D/C'd

## 2019-03-09 MED ORDER — FERROUS SULFATE 325 (65 FE) MG PO TABS: 325.0000 mg | ORAL_TABLET | Freq: Two times a day (BID) | ORAL | 3 refills | Status: DC

## 2019-03-09 MED ORDER — TRAMADOL HCL 50 MG PO TABS: 50.0000 mg | ORAL_TABLET | ORAL | 1 refills | Status: DC | PRN

## 2019-03-09 MED ORDER — CELECOXIB 200 MG PO CAPS: 200.0000 mg | ORAL_CAPSULE | Freq: Two times a day (BID) | ORAL | 1 refills | Status: DC

## 2019-03-09 MED ORDER — OXYCODONE HCL 5 MG PO TABS: 5.0000 mg | ORAL_TABLET | ORAL | 0 refills | Status: DC | PRN

## 2019-03-09 MED ORDER — ENOXAPARIN SODIUM 40 MG/0.4ML ~~LOC~~ SOLN: 40.0000 mg | SUBCUTANEOUS | 0 refills | Status: DC

## 2019-03-09 NOTE — Progress Notes (Signed)
Physical Therapy Treatment Patient Details Name: Curtis Alexander MRN: [id number] DOB: [birth year] Today's Date: 03/09/2019    History of Present Illness 73 y/o male s/p R TKA 03/05/19.  Pt had issues with hypotension post surgery and initially in CCU with low BP.    PT Comments    Participated in exercises as described below.  To edge of bed with rails and increased effort but no assist needed.  Stood with min a x 2 and was able to transfer to recliner with min guard +2 then walk forward and backwards in room as space allowed.  Pt with overall improved mobility and balance/stability today.  Pt keeps a wide BOS and generally flexed posture using walker.  No increased drainage noted today.  Remained in recliner after session.  Anticipate ability to progress ambulation tomorrow.   Follow Up Recommendations  SNF     Equipment Recommendations  Rolling walker with 5" wheels;3in1 (PT)    Recommendations for Other Services       Precautions / Restrictions Precautions Precautions: Knee;Fall Restrictions Weight Bearing Restrictions: Yes RLE Weight Bearing: Weight bearing as tolerated    Mobility  Bed Mobility Overal bed mobility: Needs Assistance Bed Mobility: Sit to Supine     Supine to sit: Supervision     General bed mobility comments: used rails but no assist needed today  Transfers Overall transfer level: Needs assistance Equipment used: Rolling walker (2 wheeled) Transfers: Sit to/from Stand Sit to Stand: Min assist;+2 physical assistance;+2 safety/equipment            Ambulation/Gait Ambulation/Gait assistance: Min guard;Min assist;+2 safety/equipment Gait Distance (Feet): 20 Feet Assistive device: Rolling walker (2 wheeled) Gait Pattern/deviations: Step-to pattern;Trunk flexed;Wide base of support Gait velocity: decreased   General Gait Details: 3' to chair then forward and backwards in room as space allowed.   Stairs             Wheelchair Mobility    Modified Rankin (Stroke Patients Only)       Balance Overall balance assessment: Needs assistance Sitting-balance support: No upper extremity supported;Feet supported Sitting balance-Leahy Scale: Good     Standing balance support: Bilateral upper extremity supported Standing balance-Leahy Scale: Fair Standing balance comment: heavy relainace on walker but no LOB's like yesterday.                            Cognition Arousal/Alertness: Awake/alert Behavior During Therapy: WFL for tasks assessed/performed Overall Cognitive Status: Within Functional Limits for tasks assessed                                        Exercises Total Joint Exercises Ankle Circles/Pumps: Strengthening;10 reps Short Arc Quad: AROM;Strengthening;10 reps Heel Slides: 10 reps;AROM Hip ABduction/ADduction: Strengthening;10 reps Straight Leg Raises: AROM;Strengthening;Right;10 reps;Supine Long Arc Quad: AROM;Strengthening;Right;10 reps;Seated Knee Flexion: AAROM;Strengthening;Right;10 reps;Seated Goniometric ROM: 0-85 in sitting EOB    General Comments        Pertinent Vitals/Pain Pain Assessment: 0-10 Pain Score: 2  Pain Location: R knee pain Pain Descriptors / Indicators: Sore Pain Intervention(s): Limited activity within patient's tolerance;Premedicated before session;Ice applied    Home Living                      Prior Function            PT Goals (current goals can now  be found in the care plan section) Progress towards PT goals: Progressing toward goals    Frequency    BID      PT Plan Current plan remains appropriate    Co-evaluation              AM-PAC PT "6 Clicks" Mobility   Outcome Measure  Help needed turning from your back to your side while in a flat bed without using bedrails?: A Little Help needed moving from lying on your back to sitting on the side of a flat bed without using bedrails?: A Little Help needed moving  to and from a bed to a chair (including a wheelchair)?: A Little Help needed standing up from a chair using your arms (e.g., wheelchair or bedside chair)?: A Little Help needed to walk in hospital room?: A Little Help needed climbing 3-5 steps with a railing? : A Lot 6 Click Score: 17    End of Session Equipment Utilized During Treatment: Gait belt Activity Tolerance: Patient tolerated treatment well Patient left: in chair;with call bell/phone within reach;with chair alarm set Nurse Communication: Mobility status Pain - Right/Left: Right Pain - part of body: Knee     Time: 1000-1032 PT Time Calculation (min) (ACUTE ONLY): 32 min  Charges:  $Gait Training: 8-22 mins $Therapeutic Exercise: 8-22 mins                    Chesley Noon, PTA 03/09/19, 11:06 AM

## 2019-03-09 NOTE — Progress Notes (Signed)
  Subjective: 4 Days Post-Op Procedure(s) (LRB): COMPUTER ASSISTED TOTAL KNEE ARTHROPLASTY (Right) Patient reports pain as mild to moderate.   Patient seen in rounds with Dr. Roland Rack. Patient is currently transferred to the orthopedic floor.  His blood pressure is improving slowly.   Plan is to go Rehab after hospital stay. Negative for chest pain and shortness of breath Fever: no Gastrointestinal:Negative for nausea and vomiting  Objective: Vital signs in last 24 hours: Temp:  [97.6 F (36.4 C)-98.5 F (36.9 C)] 98.3 F (36.8 C) (08/16 0736) Pulse Rate:  [80-103] 88 (08/16 0736) Resp:  [17-19] 18 (08/16 0736) BP: (91-115)/(54-61) 110/55 (08/16 0736) SpO2:  [94 %-100 %] 98 % (08/16 0736)  Intake/Output from previous day:  Intake/Output Summary (Last 24 hours) at 03/09/2019 0745 Last data filed at 03/09/2019 0553 Gross per 24 hour  Intake -  Output 1800 ml  Net -1800 ml    Intake/Output this shift: No intake/output data recorded.  Labs: Recent Labs    03/07/19 0438 03/08/19 0350  HGB 9.9* 9.3*   Recent Labs    03/07/19 0438 03/08/19 0350  WBC 4.9 5.0  RBC 3.16* 2.95*  HCT 30.9* 29.0*  PLT 82* 87*   Recent Labs    03/07/19 0438 03/08/19 0350  NA 144 142  K 3.9 4.0  CL 113* 110  CO2 25 27  BUN 13 13  CREATININE 0.62 0.61  GLUCOSE 105* 111*  CALCIUM 7.8* 8.1*   No results for input(s): LABPT, INR in the last 72 hours.   EXAM General - Patient is Alert and Oriented Extremity - Neurovascular intact Sensation intact distally Compartment soft Dressing/Incision - clean, dry, intact Motor Function - intact, moving foot and toes well on exam. Able to do a Straight Leg Raise independently.  Ambulated 3 feet  Past Medical History:  Diagnosis Date  . Arthritis   . Cervical spinal stenosis   . Diabetes mellitus without complication ([place])   . Essential tremor   . GERD (gastroesophageal reflux disease)   . History of hiatal hernia    fixed with gastric  bypass surgery  . History of kidney stones   . Hypertension   . Hypoglycemia   . Sleep apnea    had gastric bypass and no longer uses cpap machine  . Spinal stenosis, lumbar     Assessment/Plan: 4 Days Post-Op Procedure(s) (LRB): COMPUTER ASSISTED TOTAL KNEE ARTHROPLASTY (Right) Active Problems:   Total knee replacement status  Estimated body mass index is 42.5 kg/m as calculated from the following:   Height as of this encounter: 5\' 8"  (1.727 m).   Weight as of this encounter: 126.8 kg. Advance diet  Up with Therapy when vitals stable. Discharge planning to rehab today. Appreciate medicine involvement.  DVT Prophylaxis - Lovenox, Foot Pumps and TED hose Weight-Bearing as tolerated to Right leg  [name], PA-C Orthopaedic Surgery 03/09/2019, 7:45 AM

## 2019-03-09 NOTE — Progress Notes (Signed)
Patient had discharge orders to go to SNF, however patient unable to get PASS[facility] number unitl tomorrow. [name], [place] notified.

## 2019-03-09 NOTE — Discharge Summary (Addendum)
Physician Discharge Summary  Subjective: 4 Days Post-Op Procedure(s) (LRB): COMPUTER ASSISTED TOTAL KNEE ARTHROPLASTY (Right) Patient reports pain as mild.   Patient seen in rounds with Dr. Roland Rack. Patient is well, and has had no acute complaints or problems. Patient is ready to go to rehab.  Physician Discharge Summary  Patient ID: Curtis Alexander MRN: [id number] DOB/AGE: June 24, 1946 73 y.o.  Admit date: 03/05/2019 Discharge date: 03/11/2019  Admission Diagnoses:  Discharge Diagnoses:  Active Problems:   Total knee replacement status   Discharged Condition: stable  Hospital Course: The patient is postop day 4 from a right total knee replacement.  The patient initially was brought to the orthopedic floor but was having significant hypotension despite 2-1/2 L of IV fluids.  The patient was then sent to CCU with rapid response.  He was placed on Neo-Synephrine drip which stabilized his blood pressure after the second day.  The patient became more responsive as well.  He was able to be transferred back to orthopedics on postop day 3.  The patient has had good pain control.  The patient has had a bowel movement yesterday.  The patient ambulated 3 feet.  His blood pressure has remained stable with transfers.  The patient is ready to be discharged to rehab.  Treatments: surgery:   Right total knee arthroplasty using computer-assisted navigation  SURGEON:  Marciano Sequin. M.D.  ASSISTANT:  Benjaman Lobe, RN (present and scrubbed throughout the case, critical for assistance with exposure, retraction, instrumentation, and closure)  ANESTHESIA: spinal  ESTIMATED BLOOD LOSS: 50 mL  FLUIDS REPLACED: 1300 mL of crystalloid  TOURNIQUET TIME: 150 minutes  DRAINS: 2 medium Hemovac drains  SOFT TISSUE RELEASES: Anterior cruciate ligament, posterior cruciate ligament, deep medial collateral ligament, patellofemoral ligament  IMPLANTS UTILIZED: DePuy Attune size 8 posterior  stabilized femoral component (cemented), size 9 rotating platform tibial component (cemented), 41 mm medialized dome patella (cemented), and a 5 mm stabilized rotating platform polyethylene insert.  Discharge Exam: Blood pressure (!) 110/55, pulse 88, temperature 98.3 F (36.8 C), temperature source Oral, resp. rate 18, height 5\' 8"  (1.727 m), weight 126.8 kg, SpO2 98 %.   Disposition: Discharge disposition: 03-Skilled Nursing Facility        Allergies as of 03/09/2019   No Known Allergies     Medication List    STOP taking these medications   meloxicam 15 MG tablet Commonly known as: MOBIC     TAKE these medications   allopurinol 300 MG tablet Commonly known as: ZYLOPRIM Take 300 mg by mouth daily with lunch.   aspirin EC 81 MG tablet Take 81 mg by mouth daily.   B-12 2500 MCG Tabs Take 2,500 mcg by mouth daily.   brimonidine 0.2 % ophthalmic solution Commonly known as: ALPHAGAN Place 1 drop into the right eye 2 (two) times daily.   celecoxib 200 MG capsule Commonly known as: CELEBREX Take 1 capsule (200 mg total) by mouth 2 (two) times daily.   clonazePAM 0.5 MG tablet Commonly known as: KLONOPIN Take 0.5 mg by mouth 2 (two) times daily.   cyclobenzaprine 10 MG tablet Commonly known as: FLEXERIL Take 10 mg by mouth 3 (three) times daily.   enoxaparin 40 MG/0.4ML injection Commonly known as: LOVENOX Inject 0.4 mLs (40 mg total) into the skin daily for 14 doses.   erythromycin ophthalmic ointment Place 1 application into the left eye at bedtime as needed.   ferrous sulfate 325 (65 FE) MG tablet Take 1 tablet (325 mg total)  by mouth 2 (two) times daily with a meal.   finasteride 5 MG tablet Commonly known as: PROSCAR Take 5 mg by mouth daily with lunch.   latanoprost 0.005 % ophthalmic solution Commonly known as: XALATAN Place 1 drop into the right eye at bedtime.   loratadine 10 MG tablet Commonly known as: CLARITIN Take 10 mg by mouth daily as  needed for allergies.   metFORMIN 500 MG tablet Commonly known as: GLUCOPHAGE Take 250 mg by mouth 2 (two) times daily with a meal.   mirtazapine 45 MG tablet Commonly known as: REMERON Take 45 mg by mouth at bedtime.   omeprazole 40 MG capsule Commonly known as: PRILOSEC Take 40 mg by mouth daily with lunch.   oxyCODONE 5 MG immediate release tablet Commonly known as: Oxy IR/ROXICODONE Take 1 tablet (5 mg total) by mouth every 4 (four) hours as needed for moderate pain (pain score 4-6).   primidone 50 MG tablet Commonly known as: MYSOLINE Take 50 mg by mouth 2 (two) times daily.   propranolol 40 MG tablet Commonly known as: INDERAL Take 40 mg by mouth 2 (two) times daily.   tamsulosin 0.4 MG Caps capsule Commonly known as: FLOMAX Take 0.4 mg by mouth at bedtime.   topiramate 50 MG tablet Commonly known as: TOPAMAX Take 50-100 mg by mouth See admin instructions. Take 100 mg by mouth in am and 50 mg at lunch   traMADol 50 MG tablet Commonly known as: ULTRAM Take 1-2 tablets (50-100 mg total) by mouth every 4 (four) hours as needed for moderate pain.   vitamin C 1000 MG tablet Take 1,000 mg by mouth daily.   Vitamin D 50 MCG (2000 UT) Caps Take 2,000 Units by mouth daily.   zolpidem 5 MG tablet Commonly known as: AMBIEN Take 5 mg by mouth at bedtime.            Durable Medical Equipment  (From admission, onward)         Start     Ordered   03/05/19 1759  DME Walker rolling  Once    Question:  Patient needs a walker to treat with the following condition  Answer:  Total knee replacement status   03/05/19 1758   03/05/19 1759  DME Bedside commode  Once    Question:  Patient needs a bedside commode to treat with the following condition  Answer:  Total knee replacement status   03/05/19 1758         Follow-up Information    Watt Climes, PA On 03/19/2019.   Specialty: Physician Assistant Why: at 9:45am Contact information: [place] [address] [phone]        Dereck Leep, MD On 04/17/2019.   Specialty: Orthopedic Surgery Why: at 9:45am Contact information: [place] [address] [phone]           Signed: Prescott Parma, TODD 03/09/2019, 7:57 AM   Objective: Vital signs in last 24 hours: Temp:  [97.6 F (36.4 C)-98.5 F (36.9 C)] 98.3 F (36.8 C) (08/16 0736) Pulse Rate:  [80-103] 88 (08/16 0736) Resp:  [17-19] 18 (08/16 0736) BP: (91-115)/(54-61) 110/55 (08/16 0736) SpO2:  [94 %-100 %] 98 % (08/16 0736)  Intake/Output from previous day:  Intake/Output Summary (Last 24 hours) at 03/09/2019 0757 Last data filed at 03/09/2019 0553 Gross per 24 hour  Intake -  Output 1800 ml  Net -1800 ml    Intake/Output this  shift: No intake/output data recorded.  Labs: Recent Labs    03/07/19 0438 03/08/19 0350  HGB 9.9* 9.3*   Recent Labs    03/07/19 0438 03/08/19 0350  WBC 4.9 5.0  RBC 3.16* 2.95*  HCT 30.9* 29.0*  PLT 82* 87*   Recent Labs    03/07/19 0438 03/08/19 0350  NA 144 142  K 3.9 4.0  CL 113* 110  CO2 25 27  BUN 13 13  CREATININE 0.62 0.61  GLUCOSE 105* 111*  CALCIUM 7.8* 8.1*   No results for input(s): LABPT, INR in the last 72 hours.  EXAM: General - Patient is Alert and Oriented Extremity - Neurovascular intact Sensation intact distally Dorsiflexion/Plantar flexion intact Compartment soft Incision - clean, dry, no drainage Motor Function -plantarflexion and dorsiflexion intact.  Able to straight leg raise independently.  Assessment/Plan: 4 Days Post-Op Procedure(s) (LRB): COMPUTER ASSISTED TOTAL KNEE ARTHROPLASTY (Right) Procedure(s) (LRB): COMPUTER ASSISTED TOTAL KNEE ARTHROPLASTY (Right) Past Medical History:  Diagnosis Date  . Arthritis   . Cervical spinal stenosis   . Diabetes mellitus without complication ([place])   . Essential tremor   . GERD (gastroesophageal reflux disease)   . History of  hiatal hernia    fixed with gastric bypass surgery  . History of kidney stones   . Hypertension   . Hypoglycemia   . Sleep apnea    had gastric bypass and no longer uses cpap machine  . Spinal stenosis, lumbar    Active Problems:   Total knee replacement status  Estimated body mass index is 42.5 kg/m as calculated from the following:   Height as of this encounter: 5\' 8"  (1.727 m).   Weight as of this encounter: 126.8 kg. Up with therapy Discharge to SNF Diet - Regular diet Follow up - in 2 weeks Activity - WBAT Disposition - Rehab Condition Upon Discharge - Stable DVT Prophylaxis - Lovenox, Foot Pumps and TED hose  [name], PA-C Orthopaedic Surgery 03/09/2019, 7:57 AM

## 2019-03-09 NOTE — Plan of Care (Signed)

## 2019-03-10 DIAGNOSIS — E861 Hypovolemia: Secondary | ICD-10-CM

## 2019-03-10 NOTE — Progress Notes (Signed)
Occupational Therapy Treatment Patient Details Name: Curtis Alexander MRN: [id number] DOB: [birth year] Today's Date: 03/10/2019    History of present illness 73 y/o male s/p R TKA 03/05/19.  Pt had issues with hypotension post surgery and initially in CCU with low BP.   OT comments  Pt seen for OT tx this date. Pt eager to return to bed from recliner. Pt denies knee pain but does grimace with initial transfer. Pt participated in functional transfer training with RW and a +2 for safety. Pt weight shifted front/back for momentum and able to stand from recliner with Min A. CGA +2 for safety to amb 5 steps from recliner to EOB with cues for hand placement to improve safety prior to sitting EOB. Pt able to manage BLE back to bed without assist as well. Pt instructed in polar care mgt to support recall and carryover from last session. Pt eager to hear from case mgt. Author contact case mgt who reported will go speak with pt soon. Pt continues to benefit from skilled OT services to maximize return to PLOF and minimize caregiver burden and falls risk. Continue to recommend STR.    Follow Up Recommendations  SNF    Equipment Recommendations  None recommended by OT    Recommendations for Other Services      Precautions / Restrictions Precautions Precautions: Knee;Fall Precaution Comments: BP and HR monitoring; Pt Ind with RLE SLR, no KI required Restrictions Weight Bearing Restrictions: Yes RLE Weight Bearing: Weight bearing as tolerated       Mobility Bed Mobility Overal bed mobility: Needs Assistance Bed Mobility: Sit to Supine     Supine to sit: Supervision Sit to supine: Min guard   General bed mobility comments: + time/effort to perform but able to swing BLE back into bed without assist  Transfers Overall transfer level: Needs assistance Equipment used: Rolling walker (2 wheeled) Transfers: Sit to/from Stand Sit to Stand: Min assist;+2 safety/equipment         General  transfer comment: with pt rocking back and forth briefly for momentum, pt able to stand from recliner with Min A and a +2 for safety    Balance Overall balance assessment: Needs assistance Sitting-balance support: No upper extremity supported;Feet supported Sitting balance-Leahy Scale: Good     Standing balance support: Bilateral upper extremity supported Standing balance-Leahy Scale: Fair Standing balance comment: Heavy reliance on walker                           ADL either performed or assessed with clinical judgement   ADL                                               Vision Patient Visual Report: No change from baseline     Perception     Praxis      Cognition Arousal/Alertness: Awake/alert Behavior During Therapy: WFL for tasks assessed/performed Overall Cognitive Status: Within Functional Limits for tasks assessed                                          Exercises Other Exercises: Pt instructed in polar care to support recall and carryover; pt verbalized understanding Other Exercises: functional transfer training with additional instruction in  hand placement to maximize safety   Shoulder Instructions       General Comments HR up to 117 briefly after functional mobility task recliner to EOB    Pertinent Vitals/ Pain       Pain Assessment: No/denies pain Pain Score: 2  Pain Location: R knee pain Pain Descriptors / Indicators: Sore Pain Intervention(s): Premedicated before session;Monitored during session  Home Living                                          Prior Functioning/Environment              Frequency  Min 1X/week        Progress Toward Goals  OT Goals(current goals can now be found in the care plan section)  Progress towards OT goals: Progressing toward goals  Acute Rehab OT Goals Patient Stated Goal: go to rehab near family in [place] and then go home and continue with  outpatient therapy OT Goal Formulation: With patient Time For Goal Achievement: 03/20/19 Potential to Achieve Goals: Good  Plan Discharge plan remains appropriate;Frequency remains appropriate    Co-evaluation                 AM-PAC OT "6 Clicks" Daily Activity     Outcome Measure   Help from another person eating meals?: None Help from another person taking care of personal grooming?: None Help from another person toileting, which includes using toliet, bedpan, or urinal?: A Little Help from another person bathing (including washing, rinsing, drying)?: A Lot Help from another person to put on and taking off regular upper body clothing?: None Help from another person to put on and taking off regular lower body clothing?: A Lot 6 Click Score: 19    End of Session Equipment Utilized During Treatment: Gait belt;Rolling walker  OT Visit Diagnosis: Other abnormalities of gait and mobility (R26.89);Pain Pain - Right/Left: Right Pain - part of body: Knee   Activity Tolerance Patient tolerated treatment well   Patient Left in bed;with call bell/phone within reach;with bed alarm set;Other (comment)(polar care in place; pt refused SCD on LLE)   Nurse Communication          Time: 3545-6256 OT Time Calculation (min): 24 min  Charges: OT General Charges $OT Visit: 1 Visit OT Treatments $Therapeutic Activity: 23-37 mins  Jeni Salles, MPH, MS, OTR/L ascom [phone] 03/10/19, 2:25 PM

## 2019-03-10 NOTE — Progress Notes (Signed)
   Subjective: 5 Days Post-Op Procedure(s) (LRB): COMPUTER ASSISTED TOTAL KNEE ARTHROPLASTY (Right) Patient reports pain as mild.   Patient is well, and has had no acute complaints or problems Physical therapy progressing very slow.  Ambulated only 20 feet yesterday. Plan is to go Rehab after hospital stay. no nausea and no vomiting Patient denies any chest pains or shortness of breath. Objective: Vital signs in last 24 hours: Temp:  [98 F (36.7 C)-99.2 F (37.3 C)] 98.2 F (36.8 C) (08/17 0731) Pulse Rate:  [87-99] 87 (08/17 0731) Resp:  [14-18] 14 (08/17 0731) BP: (95-116)/(53-64) 107/63 (08/17 0731) SpO2:  [94 %-100 %] 95 % (08/17 0731) well approximated incision Heels are non tender and elevated off the bed using rolled towels Intake/Output from previous day: 08/16 0701 - 08/17 0700 In: 120 [P.O.:120] Out: 2850 [Urine:2850] Intake/Output this shift: Total I/O In: -  Out: 100 [Urine:100]  Recent Labs    03/08/19 0350  HGB 9.3*   Recent Labs    03/08/19 0350  WBC 5.0  RBC 2.95*  HCT 29.0*  PLT 87*   Recent Labs    03/08/19 0350  NA 142  K 4.0  CL 110  CO2 27  BUN 13  CREATININE 0.61  GLUCOSE 111*  CALCIUM 8.1*   No results for input(s): LABPT, INR in the last 72 hours.  EXAM General - Patient is Alert, Appropriate and Oriented Extremity - Neurologically intact Neurovascular intact Sensation intact distally Intact pulses distally Dorsiflexion/Plantar flexion intact No cellulitis present Compartment soft Dressing - scant drainage Motor Function - intact, moving foot and toes well on exam.    Past Medical History:  Diagnosis Date  . Arthritis   . Cervical spinal stenosis   . Diabetes mellitus without complication ([place])   . Essential tremor   . GERD (gastroesophageal reflux disease)   . History of hiatal hernia    fixed with gastric bypass surgery  . History of kidney stones   . Hypertension   . Hypoglycemia   . Sleep apnea    had  gastric bypass and no longer uses cpap machine  . Spinal stenosis, lumbar     Assessment/Plan: 5 Days Post-Op Procedure(s) (LRB): COMPUTER ASSISTED TOTAL KNEE ARTHROPLASTY (Right) Active Problems:   Total knee replacement status  Estimated body mass index is 42.5 kg/m as calculated from the following:   Height as of this encounter: 5\' 8"  (1.727 m).   Weight as of this encounter: 126.8 kg. Up with therapy Discharge to SNF  Labs: None DVT Prophylaxis - Lovenox, Foot Pumps and TED hose Weight-Bearing as tolerated to right leg Please change dressing prior to being discharged Please apply black TED stockings to both legs Be sure the bone foam goes with patient  Jillyn Ledger. [place] 03/10/2019, 11:53 AM

## 2019-03-10 NOTE — Progress Notes (Signed)
Date of Admission:  03/05/2019     ID: Curtis Alexander is a 73 y.o. male  Active Problems:   Rt TKA    Subjective: Waiting to go to rehab No fever No sob No dizziness Passing urine but says there is too much skin down there after Gastric bypass and that makes it difficult for him to pass urine Foley removed 2 days ago  Medications:  . allopurinol  300 mg Oral Q lunch  . brimonidine  1 drop Right Eye BID  . celecoxib  200 mg Oral BID  . Chlorhexidine Gluconate Cloth  6 each Topical Q0600  . cholecalciferol  2,000 Units Oral Daily  . clonazePAM  0.5 mg Oral BID  . cyclobenzaprine  10 mg Oral TID  . enoxaparin (LOVENOX) injection  40 mg Subcutaneous Q12H  . ferrous sulfate  325 mg Oral BID WC  . finasteride  5 mg Oral Q lunch  . gabapentin  300 mg Oral QHS  . insulin aspart  0-15 Units Subcutaneous TID WC  . insulin aspart  0-5 Units Subcutaneous QHS  . latanoprost  1 drop Right Eye QHS  . magnesium hydroxide  30 mL Oral Daily  . mirtazapine  45 mg Oral QHS  . pantoprazole  40 mg Oral Daily  . primidone  50 mg Oral BID  . senna-docusate  1 tablet Oral BID  . tamsulosin  0.4 mg Oral QHS  . topiramate  100 mg Oral Q breakfast  . topiramate  50 mg Oral Q lunch  . vitamin B-12  2,500 mcg Oral Daily  . vitamin C  1,000 mg Oral Daily  . zolpidem  5 mg Oral QHS    Objective: Vital signs in last 24 hours: Patient Vitals for the past 24 hrs:  BP Temp Temp src Pulse Resp SpO2  03/10/19 0731 107/63 98.2 F (36.8 C) Oral 87 14 95 %  03/10/19 0029 (!) 95/53 99.2 F (37.3 C) Oral 97 18 94 %  03/09/19 1729 116/64 98 F (36.7 C) - 99 18 100 %   PHYSICAL EXAM:  General: Alert, cooperative, no distress, appears stated age. pale Head: Normocephalic, without obvious abnormality, atraumatic. Eyes: Conjunctivae clear, anicteric sclerae. Pupils are equal ENT Nares normal. No drainage or sinus tenderness. Lips, mucosa, and tongue normal. No Thrush Neck: Supple, symmetrical, no  adenopathy, thyroid: non tender no carotid bruit and no JVD. Lungs:b/l air entry Heart: s1s2 Abdomen: Soft, lap scar- has some scabs Folds of skin over the scrotal area and , penis buried in  Extremities: rt knee - cold wrap not removed Skin: No rashes or lesions. Or bruising Lymph: Cervical, supraclavicular normal. Neurologic: Grossly non-focal  Lab Results Recent Labs    03/08/19 0350  WBC 5.0  HGB 9.3*  HCT 29.0*  NA 142  K 4.0  CL 110  CO2 27  BUN 13  CREATININE 0.61   CBC Latest Ref Rng & Units 03/08/2019 03/07/2019 03/06/2019  WBC 4.0 - 10.5 K/uL 5.0 4.9 6.2  Hemoglobin 13.0 - 17.0 g/dL 9.3(L) 9.9(L) 10.9(L)  Hematocrit 39.0 - 52.0 % 29.0(L) 30.9(L) 34.2(L)  Platelets 150 - 400 K/uL 87(L) 82(L) 99(L)    Microbiology: 8/13 UC neg 8/13 BC neg  Studies/Results: 8/13 CXr N  Assessment/Plan: 73 year old male with history of osteoarthritis underwent right knee total arthroplasty on 03/05/2019.  He was hypotensive that day and was in stepdown for more than 48 hours on pressors.  Hypotension-  Due to hypovolemia .  Completely resolved.  He was on pressors for more than 36 hours.  There was concern for sepsis/UTI because of ESBL E. coli in the urine preoperatively.  But patient looked well with no fever or white count and pro calcitonin was repeatedly normal.  The ESBL E. coli prior to the surgery was likely colonizing the bladder as he has BPH and possibly has residual urine.  He also had a coud placed during the surgery because of an urethral stricture.  Even though it was not traumatic he was given meropenem for 48 hours.  He already received Zosyn for 1 day.  Blood culture was negative and urine culture from 8/13 was also negative.  So meropenem was discontinued. Foley is out and he is passing urine. Because of mechanical issue it may be worth getting urologist follow-up as outpatient  Anemia possibly due to surgery.  Thrombocytopenia.  Recommend checking B12 and  folate.  Right TKA  History of gastric bypass History of abdominoplasty complicated by infection of the abdominal tissue many years ago.  Discussed the management with the patient and the care team.  ID will sign off call if needed

## 2019-03-10 NOTE — Progress Notes (Signed)
Patient stated "I am free and I like it this way" he stated he was done with the ted hose, he wasn't wearing them any more, and he no longer need the scds. then quoted Gwynne Edinger " I am free at last"

## 2019-03-10 NOTE — Progress Notes (Signed)
Physical Therapy Treatment Patient Details Name: Curtis Alexander MRN: [id number] DOB: [birth year] Today's Date: 03/10/2019    History of Present Illness 73 y/o male s/p R TKA 03/05/19.  Pt had issues with hypotension post surgery and initially in CCU with low BP.    PT Comments    Pt presents with deficits in strength, transfers, mobility, gait, balance, R knee ROM, and activity tolerance.  Pt motivated to participate during the session and is slowly progressing towards goals.  Pt was SBA with bed mobility tasks with extra time/effort required with sup to/from sit but no physical assistance.  Pt was able to perform sit to/from stand transfers with CGA only but required several rocks forward before he was able to stand with cues given for proper hand placement.  Pt was able to amb 10' in the room staying near the EOB for safety secondary to mildly tremulous BLEs and pt reporting that his BLEs felt "tired".  Ultimately pt was steady with amb with no buckling.  Pt will benefit from PT services in a SNF setting upon discharge to safely address above deficits for decreased caregiver assistance and eventual return to PLOF.       Follow Up Recommendations  SNF     Equipment Recommendations  Rolling walker with 5" wheels;3in1 (PT);Other (comment)    Recommendations for Other Services       Precautions / Restrictions Precautions Precautions: Knee;Fall Precaution Comments: BP and HR monitoring; Pt Ind with RLE SLR, no KI required Restrictions Weight Bearing Restrictions: Yes RLE Weight Bearing: Weight bearing as tolerated    Mobility  Bed Mobility Overal bed mobility: Needs Assistance Bed Mobility: Sit to Supine;Supine to Sit     Supine to sit: Supervision Sit to supine: Supervision   General bed mobility comments: Increased time/effort to perform but no physical assistance required  Transfers Overall transfer level: Needs assistance Equipment used: Rolling walker (2  wheeled) Transfers: Sit to/from Stand Sit to Stand: Min guard;From elevated surface         General transfer comment: Pt required several rocks back and forth to stand from an elevated EOB but did not require physical assistance.  Ambulation/Gait Ambulation/Gait assistance: Min guard Gait Distance (Feet): 10 Feet Assistive device: Rolling walker (2 wheeled) Gait Pattern/deviations: Step-to pattern;Trunk flexed;Wide base of support Gait velocity: decreased   General Gait Details: Pt ambulated with slow, cautious cadence with short B step length and mildly tremulous BLEs but no physical assistance required and no knee buckling noted   Stairs             Wheelchair Mobility    Modified Rankin (Stroke Patients Only)       Balance Overall balance assessment: Needs assistance Sitting-balance support: No upper extremity supported;Feet supported Sitting balance-Leahy Scale: Good     Standing balance support: Bilateral upper extremity supported Standing balance-Leahy Scale: Fair Standing balance comment: Heavy reliance on walker                            Cognition Arousal/Alertness: Awake/alert Behavior During Therapy: WFL for tasks assessed/performed Overall Cognitive Status: Within Functional Limits for tasks assessed                                        Exercises Total Joint Exercises Ankle Circles/Pumps: Strengthening;Both;10 reps Quad Sets: Strengthening;15 reps;10 reps;Right Gluteal Sets: Strengthening;Both;10 reps  Hip ABduction/ADduction: 10 reps;Both;AROM Straight Leg Raises: AROM;10 reps;Both Long Arc Quad: AROM;Strengthening;Right;10 reps;15 reps Knee Flexion: AROM;Strengthening;Right;10 reps;15 reps Goniometric ROM: R knee AROM: 2-81 deg Other Exercises Other Exercises: Positioning education/review for R knee ext PROM Other Exercises: Car transfer verbal education for sequencing in anticipation for discharge  tomorrow Other Exercises: Multiple sit to/from stand transfer training with additional instruction in hand placement to maximize safety    General Comments General comments (skin integrity, edema, etc.): HR up to 117 briefly after functional mobility task recliner to EOB      Pertinent Vitals/Pain Pain Assessment: 0-10 Pain Score: 1  Pain Location: R knee pain Pain Descriptors / Indicators: Sore Pain Intervention(s): Monitored during session    Home Living                      Prior Function            PT Goals (current goals can now be found in the care plan section) Acute Rehab PT Goals Patient Stated Goal: go to rehab near family in [place] and then go home and continue with outpatient therapy Progress towards PT goals: Progressing toward goals    Frequency    BID      PT Plan Current plan remains appropriate    Co-evaluation              AM-PAC PT "6 Clicks" Mobility   Outcome Measure  Help needed turning from your back to your side while in a flat bed without using bedrails?: A Little Help needed moving from lying on your back to sitting on the side of a flat bed without using bedrails?: A Little Help needed moving to and from a bed to a chair (including a wheelchair)?: A Little Help needed standing up from a chair using your arms (e.g., wheelchair or bedside chair)?: A Little Help needed to walk in hospital room?: A Little Help needed climbing 3-5 steps with a railing? : A Lot 6 Click Score: 17    End of Session Equipment Utilized During Treatment: Gait belt Activity Tolerance: Patient tolerated treatment well Patient left: in bed;with call bell/phone within reach;with bed alarm set;with SCD's reapplied;Other (comment)(Polar care to R knee) Nurse Communication: Mobility status;Other (comment)(Pt requested all four bed rails up for comfort) PT Visit Diagnosis: Muscle weakness (generalized) (M62.81);Difficulty in walking, not elsewhere classified  (R26.2);Pain Pain - Right/Left: Right Pain - part of body: Knee     Time: 1275-1700 PT Time Calculation (min) (ACUTE ONLY): 24 min  Charges:  $Therapeutic Exercise: 8-22 mins $Therapeutic Activity: 8-22 mins                     D. Scott Mabry Tift PT, DPT 03/10/19, 5:39 PM

## 2019-03-10 NOTE — Progress Notes (Signed)
Physical Therapy Treatment Patient Details Name: Curtis Alexander MRN: [id number] DOB: 11/21/1945 Today's Date: 03/10/2019    History of Present Illness 73 y/o male s/p R TKA 03/05/19.  Pt had issues with hypotension post surgery and initially in CCU with low BP.    PT Comments    Pt presents with deficits in strength, transfers, mobility, gait, balance, R knee ROM, and activity tolerance.  Pt motivated to participate throughout session.  Pt was SBA with sup to sit with extra time and effort and use of the bed rail required.  Pt was able to stand from an elevated EOB with min A and +2 for safety.  Pt was able to take several effortful steps from the EOB to the [facility] but seemed to improve from an effort/stability standpoint after taking several small steps.  Pt's amb limited this session secondary to him having to have a BM, nursing notified that pt left on [facility] with call bell in reach.  Pt will benefit from PT services in a SNF setting upon discharge to safely address above deficits for decreased caregiver assistance and eventual return to PLOF.     Follow Up Recommendations  SNF     Equipment Recommendations  Rolling walker with 5" wheels;3in1 (PT);Other (comment)(TBD at next venue of care upon discharge to SNF)    Recommendations for Other Services       Precautions / Restrictions Precautions Precautions: Knee;Fall Precaution Comments: BP and HR monitoring; Pt Ind with RLE SLR, no KI required Restrictions Weight Bearing Restrictions: Yes RLE Weight Bearing: Weight bearing as tolerated    Mobility  Bed Mobility Overal bed mobility: Needs Assistance Bed Mobility: Supine to Sit     Supine to sit: Supervision     General bed mobility comments: Use of bed rail and extra time/effort required  Transfers Overall transfer level: Needs assistance Equipment used: Rolling walker (2 wheeled) Transfers: Sit to/from Stand Sit to Stand: Min assist;+2 safety/equipment;From elevated  surface         General transfer comment: Pt able to stand of first attempt from an elevated EOB with cues for proper hand placement  Ambulation/Gait Ambulation/Gait assistance: Min assist;+2 safety/equipment Gait Distance (Feet): 3 Feet Assistive device: Rolling walker (2 wheeled) Gait Pattern/deviations: Step-to pattern;Trunk flexed;Wide base of support Gait velocity: decreased   General Gait Details: +2 assist for safety with amb with pt stating BLEs felt "weak" with effortful steps from EOB to the [facility] but pt was steady without LOB; amb limited this session secondary to pt requiring BM   Stairs             Wheelchair Mobility    Modified Rankin (Stroke Patients Only)       Balance Overall balance assessment: Needs assistance Sitting-balance support: No upper extremity supported;Feet supported Sitting balance-Leahy Scale: Good     Standing balance support: Bilateral upper extremity supported Standing balance-Leahy Scale: Fair Standing balance comment: Heavy reliance on walker                            Cognition Arousal/Alertness: Awake/alert Behavior During Therapy: WFL for tasks assessed/performed Overall Cognitive Status: Within Functional Limits for tasks assessed                                        Exercises Total Joint Exercises Ankle Circles/Pumps: Strengthening;10 reps;Both Quad Sets: Strengthening;15  reps;10 reps;Right Gluteal Sets: Strengthening;Both;10 reps Hip ABduction/ADduction: 10 reps;Both;AROM Straight Leg Raises: AROM;10 reps;Both Long Arc Quad: AROM;Strengthening;Right;10 reps;15 reps Knee Flexion: AROM;Strengthening;Right;10 reps;15 reps Goniometric ROM: R knee AROM: 2-81 deg Other Exercises Other Exercises: HEP education and review for BLE APs, QS, GS, and LAQs/R knee flex x 10 each 5-6x/day    General Comments        Pertinent Vitals/Pain Pain Assessment: 0-10 Pain Score: 2  Pain Location: R  knee pain Pain Descriptors / Indicators: Sore Pain Intervention(s): Premedicated before session;Monitored during session    Home Living                      Prior Function            PT Goals (current goals can now be found in the care plan section) Progress towards PT goals: Progressing toward goals    Frequency    BID      PT Plan Current plan remains appropriate    Co-evaluation              AM-PAC PT "6 Clicks" Mobility   Outcome Measure  Help needed turning from your back to your side while in a flat bed without using bedrails?: A Little Help needed moving from lying on your back to sitting on the side of a flat bed without using bedrails?: A Little Help needed moving to and from a bed to a chair (including a wheelchair)?: A Little Help needed standing up from a chair using your arms (e.g., wheelchair or bedside chair)?: A Little Help needed to walk in hospital room?: A Little Help needed climbing 3-5 steps with a railing? : A Lot 6 Click Score: 17    End of Session Equipment Utilized During Treatment: Gait belt Activity Tolerance: Patient tolerated treatment well Patient left: Other (comment);with call bell/phone within reach(On [facility]) Nurse Communication: Mobility status;Other (comment)(CNA alerted that pt needing assist from the [facility]) PT Visit Diagnosis: Muscle weakness (generalized) (M62.81);Difficulty in walking, not elsewhere classified (R26.2);Pain Pain - Right/Left: Right Pain - part of body: Knee     Time: 3716-9678 PT Time Calculation (min) (ACUTE ONLY): 24 min  Charges:  $Therapeutic Exercise: 8-22 mins $Therapeutic Activity: 8-22 mins                     D. Scott Bellamarie Pflug PT, DPT 03/10/19, 1:48 PM

## 2019-03-10 NOTE — TOC Progression Note (Signed)
Transition of Care [facility]) - Progression Note    Patient Details  Name: Curtis Alexander MRN: [id number] Date of Birth: Jan 25, 1946  Transition of Care [facility]) CM/SW Contact  Su Hilt, RN Phone Number: 03/10/2019, 2:51 PM  Clinical Narrative:    Attempted to call the rehab facility [facility] in [place] 3 additional times, I have been unable to get an answer and    Have anyone call me back,  I left a voice mail for [place] and 2 voice mails for Alyse Low I explained to the patient that he could incur a fee to go out of the county with EMS for transport He asked how he would get back to [facility] for his follow up appointments, I explained that the facility he has chosen may offer transportation or his family would be able to provide transportation I explained that some facilities charge a fee to transport to appointments and that he would need to ask the facility he previously made arrangements to go to.    Expected Discharge Plan: [place] Barriers to Discharge: Continued Medical Work up  Expected Discharge Plan and Services Expected Discharge Plan: [place] Choice: [place] arrangements for the past 2 months: Single Family Home Expected Discharge Date: 03/10/19                                     Social Determinants of Health (SDOH) Interventions    Readmission Risk Interventions No flowsheet data found.

## 2019-03-11 LAB — CULTURE, BLOOD (ROUTINE X 2)
Culture: NO GROWTH
Culture: NO GROWTH
Special Requests: ADEQUATE
Special Requests: ADEQUATE

## 2019-03-11 NOTE — TOC Transition Note (Addendum)
Transition of Care [facility]) - CM/SW Discharge Note   Patient Details  Name: Curtis Alexander MRN: [id number] Date of Birth: Sep 26, 1945  Transition of Care [facility]) CM/SW Contact:  Su Hilt, RN Phone Number: 03/11/2019, 8:49 AM   Clinical Narrative:    [facility] [place] in [place], left a detailed secure VM for Kristi at [phone] Faxed the Discharge papers to [phone] also printed and put into the DC packet Nurse to call report to [phone] Going to room 109 Daughter will be here at 1 PM to transport    Barriers to Discharge: Continued Medical Work up   Patient Goals and CMS Choice     Choice offered to / list presented to : NA  Discharge Placement                       Discharge Plan and Services     Post Acute Care Choice: [place]                               Social Determinants of Health (SDOH) Interventions     Readmission Risk Interventions No flowsheet data found.

## 2019-03-11 NOTE — Progress Notes (Signed)
Physical Therapy Treatment Patient Details Name: Curtis Alexander MRN: [id number] DOB: 05-30-1946 Today's Date: 03/11/2019    History of Present Illness 73 y/o male s/p R TKA 03/05/19.  Pt had issues with hypotension post surgery and initially in CCU with low BP.    PT Comments    Pt in bed upon arrival and eager to participate with PT. Pt performed R knee AROM 2-81 deg. Pt performed functional mobility tasks with supervision to min guard assistance. Pt performed standing weight shifts and standing marching and self reported increased feeling of unsteadiness today. Pt presented with increased shakiness in standing today. Pt would benefit from continued acute therapy to improve strength, ROM, balance, gait and stair training.    Follow Up Recommendations  SNF     Equipment Recommendations  Rolling walker with 5" wheels;3in1 (PT);Other (comment)    Recommendations for Other Services       Precautions / Restrictions Precautions Precautions: Fall;Knee Precaution Comments: BP and HR monitoring; Pt Ind with RLE SLR, no KI required Restrictions Weight Bearing Restrictions: Yes RLE Weight Bearing: Weight bearing as tolerated    Mobility  Bed Mobility Overal bed mobility: Needs Assistance Bed Mobility: Supine to Sit     Supine to sit: Supervision     General bed mobility comments: Increased time/effort to perform but no physical assistance required  Transfers Overall transfer level: Needs assistance Equipment used: Rolling walker (2 wheeled) Transfers: Sit to/from [facility] Sit to Stand: Min guard Stand pivot transfers: Min guard       General transfer comment: pt did not require physical assistance to stand, pt reported feeling mild unsteadiness in legs today upon standing, pt required VC for hand placement and bringing RLE forward prior to sitting in chair  Ambulation/Gait     Assistive device: Rolling walker (2 wheeled)           Stairs              Wheelchair Mobility    Modified Rankin (Stroke Patients Only)       Balance Overall balance assessment: Needs assistance Sitting-balance support: No upper extremity supported;Feet supported Sitting balance-Leahy Scale: Good     Standing balance support: Bilateral upper extremity supported Standing balance-Leahy Scale: Fair Standing balance comment: Heavy reliance on walker                            Cognition Arousal/Alertness: Awake/alert Behavior During Therapy: WFL for tasks assessed/performed Overall Cognitive Status: Within Functional Limits for tasks assessed                                 General Comments: Pt very pleasant and eager to participate with PT      Exercises Total Joint Exercises Quad Sets: Strengthening;Both;10 reps Hip ABduction/ADduction: AROM;Right;10 reps Straight Leg Raises: AROM;Right;10 reps Long Arc Quad: AROM;Strengthening;Right;15 reps;Seated Knee Flexion: AROM;Strengthening;Right;15 reps Goniometric ROM: R knee AROM: 2-81 deg    General Comments        Pertinent Vitals/Pain Faces Pain Scale: Hurts a little bit Pain Location: R knee pain Pain Descriptors / Indicators: Sore Pain Intervention(s): Monitored during session;Premedicated before session;Limited activity within patient's tolerance    Home Living                      Prior Function  PT Goals (current goals can now be found in the care plan section) Progress towards PT goals: Progressing toward goals    Frequency    BID      PT Plan Current plan remains appropriate    Co-evaluation              AM-PAC PT "6 Clicks" Mobility   Outcome Measure  Help needed turning from your back to your side while in a flat bed without using bedrails?: A Little Help needed moving from lying on your back to sitting on the side of a flat bed without using bedrails?: A Little Help needed moving to and from a bed to a  chair (including a wheelchair)?: A Little   Help needed to walk in hospital room?: A Little Help needed climbing 3-5 steps with a railing? : A Lot 6 Click Score: 14    End of Session Equipment Utilized During Treatment: Gait belt Activity Tolerance: Patient tolerated treatment well Patient left: in chair;with call bell/phone within reach;with chair alarm set Nurse Communication: Mobility status PT Visit Diagnosis: Muscle weakness (generalized) (M62.81);Difficulty in walking, not elsewhere classified (R26.2);Pain Pain - Right/Left: Right Pain - part of body: Knee     Time: 1020-1049 PT Time Calculation (min) (ACUTE ONLY): 29 min  Charges:  $Therapeutic Exercise: 23-37 mins                     Jonothan Heberle PT, DPT 2:10 PM,03/11/19 [phone]

## 2019-03-11 NOTE — Progress Notes (Signed)
Patient discharged home as ordered,transported by daughter.Instructions explained and well understood,vital signs within normal limits upon discharge.

## 2019-03-11 NOTE — Progress Notes (Signed)
Patient refusing towel roll under heel.

## 2019-03-11 NOTE — Progress Notes (Signed)
   Subjective: 6 Days Post-Op Procedure(s) (LRB): COMPUTER ASSISTED TOTAL KNEE ARTHROPLASTY (Right) Patient reports pain as mild.   Patient is well, and has had no acute complaints or problems Therapy continues to be slow.  Was only able to ambulate 10 feet yesterday. Plan is to go Rehab after hospital stay. no nausea and no vomiting Patient denies any chest pains or shortness of breath. According to the patient Delilah as well as the patient have made contact with the rehab facility and everything is approved except for a "number". Patient is complaining some pain to the lateral aspect of the right ankle due to the external rotation of the lower extremity and the bone foam.  We will have nurses apply protective skin padding to that area. Nurses also state that he is given him a hard time with everything.  Refusing to wear TED stockings on the operative leg.  Objective: Vital signs in last 24 hours: Temp:  [98 F (36.7 C)-98.5 F (36.9 C)] 98.5 F (36.9 C) (08/17 2354) Pulse Rate:  [87-91] 91 (08/17 2354) Resp:  [14-16] 16 (08/17 2354) BP: (96-110)/(50-68) 96/50 (08/17 2354) SpO2:  [94 %-96 %] 94 % (08/17 2354) well approximated incision Heels are non tender and elevated off the bed using rolled towels Intake/Output from previous day: 08/17 0701 - 08/18 0700 In: -  Out: 2250 [Urine:2250] Intake/Output this shift: No intake/output data recorded.  No results for input(s): HGB in the last 72 hours. No results for input(s): WBC, RBC, HCT, PLT in the last 72 hours. No results for input(s): NA, K, CL, CO2, BUN, CREATININE, GLUCOSE, CALCIUM in the last 72 hours. No results for input(s): LABPT, INR in the last 72 hours.  EXAM General - Patient is Alert, Appropriate and Oriented Extremity - Neurologically intact Neurovascular intact Sensation intact distally Intact pulses distally Dorsiflexion/Plantar flexion intact No cellulitis present Compartment soft Dressing - scant  drainage Motor Function - intact, moving foot and toes well on exam.    Past Medical History:  Diagnosis Date  . Arthritis   . Cervical spinal stenosis   . Diabetes mellitus without complication ([place])   . Essential tremor   . GERD (gastroesophageal reflux disease)   . History of hiatal hernia    fixed with gastric bypass surgery  . History of kidney stones   . Hypertension   . Hypoglycemia   . Sleep apnea    had gastric bypass and no longer uses cpap machine  . Spinal stenosis, lumbar     Assessment/Plan: 6 Days Post-Op Procedure(s) (LRB): COMPUTER ASSISTED TOTAL KNEE ARTHROPLASTY (Right) Active Problems:   Total knee replacement status  Estimated body mass index is 42.5 kg/m as calculated from the following:   Height as of this encounter: 5\' 8"  (1.727 m).   Weight as of this encounter: 126.8 kg. Up with therapy Discharge to SNF  Labs: None DVT Prophylaxis - Lovenox, Foot Pumps and TED hose Weight-Bearing as tolerated to right leg Apply protective skin padding to the lateral malleolus of right ankle TED stockings to be worn bilaterally Patient may be discharged to rehab once paperwork is completed  Wille Glaser R. Jackson [place] 03/11/2019, 7:13 AM

## 2019-03-11 NOTE — Care Management Important Message (Signed)
Important Message  Patient Details  Name: Curtis Alexander MRN: [id number] Date of Birth: [birth year]   Medicare Important Message Given:  Yes     Jaelyn Bourgoin Jen Mow, RN 03/11/2019, 11:19 AM

## 2019-03-25 ENCOUNTER — Other Ambulatory Visit: Payer: Self-pay

## 2019-03-25 ENCOUNTER — Encounter: Payer: Self-pay | Admitting: Orthopedic Surgery

## 2019-03-25 ENCOUNTER — Inpatient Hospital Stay
Admission: AD | Admit: 2019-03-25 | Discharge: 2019-04-01 | DRG: 488 | Disposition: A | Discharge status: Skilled Nursing Facility | Payer: Medicare Other | Source: Ambulatory Visit | Attending: Orthopedic Surgery | Admitting: Orthopedic Surgery

## 2019-03-25 DIAGNOSIS — Z96651 Presence of right artificial knee joint: Secondary | ICD-10-CM | POA: Diagnosis present

## 2019-03-25 DIAGNOSIS — M109 Gout, unspecified: Secondary | ICD-10-CM | POA: Diagnosis present

## 2019-03-25 DIAGNOSIS — M1990 Unspecified osteoarthritis, unspecified site: Secondary | ICD-10-CM | POA: Diagnosis present

## 2019-03-25 DIAGNOSIS — Z6841 Body Mass Index (BMI) 40.0 and over, adult: Secondary | ICD-10-CM

## 2019-03-25 DIAGNOSIS — E119 Type 2 diabetes mellitus without complications: Secondary | ICD-10-CM | POA: Diagnosis present

## 2019-03-25 DIAGNOSIS — I10 Essential (primary) hypertension: Secondary | ICD-10-CM | POA: Diagnosis present

## 2019-03-25 DIAGNOSIS — E162 Hypoglycemia, unspecified: Secondary | ICD-10-CM | POA: Diagnosis present

## 2019-03-25 DIAGNOSIS — Z9884 Bariatric surgery status: Secondary | ICD-10-CM | POA: Diagnosis not present

## 2019-03-25 DIAGNOSIS — M48061 Spinal stenosis, lumbar region without neurogenic claudication: Secondary | ICD-10-CM | POA: Diagnosis present

## 2019-03-25 DIAGNOSIS — M66261 Spontaneous rupture of extensor tendons, right lower leg: Secondary | ICD-10-CM | POA: Diagnosis present

## 2019-03-25 DIAGNOSIS — M4802 Spinal stenosis, cervical region: Secondary | ICD-10-CM | POA: Diagnosis present

## 2019-03-25 DIAGNOSIS — K219 Gastro-esophageal reflux disease without esophagitis: Secondary | ICD-10-CM | POA: Diagnosis present

## 2019-03-25 DIAGNOSIS — Z79899 Other long term (current) drug therapy: Secondary | ICD-10-CM | POA: Diagnosis not present

## 2019-03-25 DIAGNOSIS — Z7982 Long term (current) use of aspirin: Secondary | ICD-10-CM

## 2019-03-25 DIAGNOSIS — G250 Essential tremor: Secondary | ICD-10-CM | POA: Diagnosis present

## 2019-03-25 DIAGNOSIS — Z7984 Long term (current) use of oral hypoglycemic drugs: Secondary | ICD-10-CM | POA: Diagnosis not present

## 2019-03-25 DIAGNOSIS — G4730 Sleep apnea, unspecified: Secondary | ICD-10-CM | POA: Diagnosis present

## 2019-03-25 DIAGNOSIS — S86811A Strain of other muscle(s) and tendon(s) at lower leg level, right leg, initial encounter: Secondary | ICD-10-CM

## 2019-03-25 DIAGNOSIS — Z20828 Contact with and (suspected) exposure to other viral communicable diseases: Secondary | ICD-10-CM | POA: Diagnosis present

## 2019-03-25 DIAGNOSIS — S76111A Strain of right quadriceps muscle, fascia and tendon, initial encounter: Principal | ICD-10-CM | POA: Diagnosis present

## 2019-03-25 HISTORY — DX: Spontaneous rupture of extensor tendons, right lower leg: M66.261

## 2019-03-25 LAB — GLUCOSE, CAPILLARY: Glucose-Capillary: 198 mg/dL — ABNORMAL HIGH (ref 70–99)

## 2019-03-25 MED ORDER — PRIMIDONE 50 MG PO TABS
50.0000 mg | ORAL_TABLET | Freq: Two times a day (BID) | ORAL | Status: DC
  Administered 2019-03-25 – 2019-04-01 (×13): 50 mg via ORAL
  Filled 2019-03-25 (×15): qty 1

## 2019-03-25 MED ORDER — ALLOPURINOL 300 MG PO TABS
300.0000 mg | ORAL_TABLET | Freq: Every day | ORAL | Status: DC
  Administered 2019-03-26 – 2019-04-01 (×6): 300 mg via ORAL
  Filled 2019-03-25 (×7): qty 1

## 2019-03-25 MED ORDER — TRAMADOL HCL 50 MG PO TABS: 50.0000 mg | ORAL_TABLET | ORAL | Status: DC | PRN

## 2019-03-25 MED ORDER — MIRTAZAPINE 15 MG PO TABS
45.0000 mg | ORAL_TABLET | Freq: Every day | ORAL | Status: DC
  Administered 2019-03-25 – 2019-03-31 (×7): 45 mg via ORAL
  Filled 2019-03-25 (×7): qty 3

## 2019-03-25 MED ORDER — FERROUS SULFATE 325 (65 FE) MG PO TABS
325.0000 mg | ORAL_TABLET | Freq: Two times a day (BID) | ORAL | Status: DC
  Administered 2019-03-26 – 2019-03-27 (×4): 325 mg via ORAL
  Filled 2019-03-25 (×7): qty 1

## 2019-03-25 MED ORDER — VITAMIN B-12 1000 MCG PO TABS
2500.0000 ug | ORAL_TABLET | Freq: Every day | ORAL | Status: DC
  Administered 2019-03-26 – 2019-03-27 (×2): 2500 ug via ORAL
  Filled 2019-03-25 (×4): qty 3

## 2019-03-25 MED ORDER — SIMVASTATIN 20 MG PO TABS
20.0000 mg | ORAL_TABLET | Freq: Every evening | ORAL | Status: DC
  Administered 2019-03-25 – 2019-03-31 (×6): 20 mg via ORAL
  Filled 2019-03-25 (×7): qty 1

## 2019-03-25 MED ORDER — CYCLOBENZAPRINE HCL 10 MG PO TABS
10.0000 mg | ORAL_TABLET | Freq: Three times a day (TID) | ORAL | Status: DC
  Administered 2019-03-25 – 2019-04-01 (×18): 10 mg via ORAL
  Filled 2019-03-25 (×20): qty 1

## 2019-03-25 MED ORDER — LORATADINE 10 MG PO TABS: 10.0000 mg | ORAL_TABLET | Freq: Every day | ORAL | Status: DC | PRN

## 2019-03-25 MED ORDER — ASPIRIN EC 81 MG PO TBEC
81.0000 mg | DELAYED_RELEASE_TABLET | Freq: Every day | ORAL | Status: DC
  Administered 2019-03-27: 09:00:00 81 mg via ORAL
  Filled 2019-03-25 (×2): qty 1

## 2019-03-25 MED ORDER — ZOLPIDEM TARTRATE 5 MG PO TABS
5.0000 mg | ORAL_TABLET | Freq: Every day | ORAL | Status: DC
  Administered 2019-03-25 – 2019-03-31 (×7): 5 mg via ORAL
  Filled 2019-03-25 (×8): qty 1

## 2019-03-25 MED ORDER — CELECOXIB 200 MG PO CAPS
200.0000 mg | ORAL_CAPSULE | Freq: Two times a day (BID) | ORAL | Status: DC
  Administered 2019-03-25 – 2019-04-01 (×13): 200 mg via ORAL
  Filled 2019-03-25 (×14): qty 1

## 2019-03-25 MED ORDER — VITAMIN D 25 MCG (1000 UNIT) PO TABS
2000.0000 [IU] | ORAL_TABLET | Freq: Every day | ORAL | Status: DC
  Administered 2019-03-26 – 2019-04-01 (×6): 2000 [IU] via ORAL
  Filled 2019-03-25 (×7): qty 2

## 2019-03-25 MED ORDER — OXYCODONE HCL 5 MG PO TABS: 5.0000 mg | ORAL_TABLET | ORAL | Status: DC | PRN

## 2019-03-25 MED ORDER — TOPIRAMATE 25 MG PO TABS
50.0000 mg | ORAL_TABLET | Freq: Every day | ORAL | Status: DC
  Administered 2019-03-26 – 2019-04-01 (×6): 50 mg via ORAL
  Filled 2019-03-25 (×7): qty 2

## 2019-03-25 MED ORDER — SENNA 8.6 MG PO TABS
1.0000 | ORAL_TABLET | Freq: Two times a day (BID) | ORAL | Status: DC
  Administered 2019-03-26 – 2019-03-31 (×10): 8.6 mg via ORAL
  Filled 2019-03-25 (×15): qty 1

## 2019-03-25 MED ORDER — INSULIN ASPART 100 UNIT/ML ~~LOC~~ SOLN: 0.0000 [IU] | Freq: Three times a day (TID) | SUBCUTANEOUS | Status: DC

## 2019-03-25 MED ORDER — TOPIRAMATE 100 MG PO TABS
100.0000 mg | ORAL_TABLET | Freq: Every day | ORAL | Status: DC
  Administered 2019-03-27 – 2019-04-01 (×5): 100 mg via ORAL
  Filled 2019-03-25: qty 4
  Filled 2019-03-25 (×3): qty 1
  Filled 2019-03-25 (×2): qty 4
  Filled 2019-03-25: qty 1

## 2019-03-25 MED ORDER — VITAMIN C 500 MG PO TABS
1000.0000 mg | ORAL_TABLET | Freq: Every day | ORAL | Status: DC
  Administered 2019-03-26 – 2019-03-27 (×2): 1000 mg via ORAL
  Filled 2019-03-25 (×4): qty 2

## 2019-03-25 MED ORDER — BRIMONIDINE TARTRATE 0.2 % OP SOLN
1.0000 [drp] | Freq: Two times a day (BID) | OPHTHALMIC | Status: DC
  Administered 2019-03-25 – 2019-04-01 (×14): 1 [drp] via OPHTHALMIC
  Filled 2019-03-25: qty 5

## 2019-03-25 MED ORDER — PROPRANOLOL HCL 20 MG PO TABS
40.0000 mg | ORAL_TABLET | Freq: Two times a day (BID) | ORAL | Status: DC
  Administered 2019-03-25 – 2019-04-01 (×7): 40 mg via ORAL
  Filled 2019-03-25 (×8): qty 2

## 2019-03-25 MED ORDER — ERYTHROMYCIN 5 MG/GM OP OINT
1.0000 "application " | TOPICAL_OINTMENT | Freq: Every evening | OPHTHALMIC | Status: DC | PRN
  Filled 2019-03-25: qty 1

## 2019-03-25 MED ORDER — ONDANSETRON HCL 4 MG/2ML IJ SOLN: 4.0000 mg | Freq: Four times a day (QID) | INTRAMUSCULAR | Status: DC | PRN

## 2019-03-25 MED ORDER — TAMSULOSIN HCL 0.4 MG PO CAPS
0.4000 mg | ORAL_CAPSULE | Freq: Every day | ORAL | Status: DC
  Administered 2019-03-25 – 2019-03-31 (×7): 0.4 mg via ORAL
  Filled 2019-03-25 (×7): qty 1

## 2019-03-25 MED ORDER — FINASTERIDE 5 MG PO TABS
5.0000 mg | ORAL_TABLET | Freq: Every day | ORAL | Status: DC
  Administered 2019-03-26 – 2019-04-01 (×6): 5 mg via ORAL
  Filled 2019-03-25 (×6): qty 1

## 2019-03-25 MED ORDER — BISACODYL 10 MG RE SUPP: 10.0000 mg | Freq: Every day | RECTAL | Status: DC | PRN

## 2019-03-25 MED ORDER — CLONAZEPAM 0.5 MG PO TABS
0.5000 mg | ORAL_TABLET | Freq: Two times a day (BID) | ORAL | Status: DC
  Administered 2019-03-26: 0.5 mg via ORAL
  Administered 2019-03-27: 0.25 mg via ORAL
  Filled 2019-03-25 (×4): qty 1

## 2019-03-25 MED ORDER — ONDANSETRON HCL 4 MG PO TABS: 4.0000 mg | ORAL_TABLET | Freq: Four times a day (QID) | ORAL | Status: DC | PRN

## 2019-03-25 MED ORDER — MAGNESIUM HYDROXIDE 400 MG/5ML PO SUSP: 30.0000 mL | Freq: Every day | ORAL | Status: DC | PRN

## 2019-03-25 MED ORDER — LATANOPROST 0.005 % OP SOLN
1.0000 [drp] | Freq: Every day | OPHTHALMIC | Status: DC
  Administered 2019-03-25 – 2019-03-31 (×7): 1 [drp] via OPHTHALMIC
  Filled 2019-03-25: qty 2.5

## 2019-03-25 MED ORDER — METFORMIN HCL 500 MG PO TABS
250.0000 mg | ORAL_TABLET | Freq: Two times a day (BID) | ORAL | Status: DC
  Administered 2019-03-26: 10:00:00 250 mg via ORAL
  Filled 2019-03-25 (×4): qty 1

## 2019-03-25 MED ORDER — FLEET ENEMA 7-19 GM/118ML RE ENEM: 1.0000 | ENEMA | Freq: Once | RECTAL | Status: DC | PRN

## 2019-03-25 NOTE — H&P (Signed)
ORTHOPAEDIC CONSULTATION  PATIENT NAME: Curtis Alexander DOB: 1945-11-20  MRN: [id number]  REQUESTING PHYSICIAN: Dereck Leep, MD  Chief Complaint: Right knee pain  HPI: Curtis Alexander is a 73 y.o. male who underwent right total knee arthroplasty approximately 2 weeks ago.  He underwent a course of rehabilitation and had recently been discharged to home.  He had progressed well with therapy.  However, earlier today he was walking with a walker and the right knee gave way and he felt "a pop".  He had difficulty with standing due to the right knee pain.  He also had difficulty with fully extending the knee.  The patient contacted the office and presented to the office at which time x-rays were obtained.  There was no evidence of periprosthetic fracture, although there was some question of slight elevation of the patella.  He was max assist of 2 with transfers and was not felt to be safe returning home.  Past Medical History:  Diagnosis Date  . Arthritis   . Cervical spinal stenosis   . Diabetes mellitus without complication ([place])   . Essential tremor   . GERD (gastroesophageal reflux disease)   . History of hiatal hernia    fixed with gastric bypass surgery  . History of kidney stones   . Hypertension   . Hypoglycemia   . Non-traumatic rupture of right patellar tendon 03/25/2019  . Sleep apnea    had gastric bypass and no longer uses cpap machine  . Spinal stenosis, lumbar    Past Surgical History:  Procedure Laterality Date  . FRACTURE SURGERY Left    age 21  . GASTRIC BYPASS     fixed hiatal hernia  . KNEE ARTHROPLASTY Right 03/05/2019   Procedure: COMPUTER ASSISTED TOTAL KNEE ARTHROPLASTY;  Surgeon: Dereck Leep, MD;  Location: [facility] ORS;  Service: Orthopedics;  Laterality: Right;  . SKIN SURGERY     after gastric bypass and pt became septic and had wound vac placed  . VENA CAVA FILTER PLACEMENT     WAS PLACED DURING PTS GASTRIC BYPASS SURGERY PER PT   Social History    Socioeconomic History  . Marital status: Married    Spouse name: Not on file  . Number of children: Not on file  . Years of education: Not on file  . Highest education level: Not on file  Occupational History  . Not on file  Social Needs  . Financial resource strain: Not on file  . Food insecurity    Worry: Not on file    Inability: Not on file  . Transportation needs    Medical: Not on file    Non-medical: Not on file  Tobacco Use  . Smoking status: Never Smoker  . Smokeless tobacco: Never Used  Substance and Sexual Activity  . Alcohol use: Yes    Comment: occ 1 glass per week  . Drug use: Never  . Sexual activity: Not on file  Lifestyle  . Physical activity    Days per week: Not on file    Minutes per session: Not on file  . Stress: Not on file  Relationships  . Social Herbalist on phone: Not on file    Gets together: Not on file    Attends religious service: Not on file    Active member of club or organization: Not on file    Attends meetings of clubs or organizations: Not on file    Relationship status: Not on file  Other Topics Concern  . Not on file  Social History Narrative  . Not on file   History reviewed. No pertinent family history. No Known Allergies Prior to Admission medications   Medication Sig Start Date End Date Taking? Authorizing Provider  allopurinol (ZYLOPRIM) 300 MG tablet Take 300 mg by mouth daily with lunch.    [provider]  Ascorbic Acid (VITAMIN C) 1000 MG tablet Take 1,000 mg by mouth daily.    [provider]  aspirin EC 81 MG tablet Take 81 mg by mouth daily.    [provider]  brimonidine (ALPHAGAN) 0.2 % ophthalmic solution Place 1 drop into the right eye 2 (two) times daily.    [provider]  celecoxib (CELEBREX) 200 MG capsule Take 1 capsule (200 mg total) by mouth 2 (two) times daily. 03/09/19   [name], PA-C  Cholecalciferol (VITAMIN D) 50 MCG (2000 UT) CAPS Take 2,000  Units by mouth daily.    [provider]  clonazePAM (KLONOPIN) 0.5 MG tablet Take 0.5 mg by mouth 2 (two) times daily.     [provider]  Cyanocobalamin (B-12) 2500 MCG TABS Take 2,500 mcg by mouth daily.    [provider]  cyclobenzaprine (FLEXERIL) 10 MG tablet Take 10 mg by mouth 3 (three) times daily.    [provider]  enoxaparin (LOVENOX) 40 MG/0.4ML injection Inject 0.4 mLs (40 mg total) into the skin daily for 14 doses. 03/09/19 03/23/19  [name], PA-C  erythromycin ophthalmic ointment Place 1 application into the left eye at bedtime as needed.     [provider]  ferrous sulfate 325 (65 FE) MG tablet Take 1 tablet (325 mg total) by mouth 2 (two) times daily with a meal. 03/09/19   [name], PA-C  finasteride (PROSCAR) 5 MG tablet Take 5 mg by mouth daily with lunch.    [provider]  latanoprost (XALATAN) 0.005 % ophthalmic solution Place 1 drop into the right eye at bedtime.    [provider]  loratadine (CLARITIN) 10 MG tablet Take 10 mg by mouth daily as needed for allergies.    [provider]  metFORMIN (GLUCOPHAGE) 500 MG tablet Take 250 mg by mouth 2 (two) times daily with a meal.    [provider]  mirtazapine (REMERON) 45 MG tablet Take 45 mg by mouth at bedtime.    [provider]  omeprazole (PRILOSEC) 40 MG capsule Take 40 mg by mouth daily with lunch.    [provider]  oxyCODONE (OXY IR/ROXICODONE) 5 MG immediate release tablet Take 1 tablet (5 mg total) by mouth every 4 (four) hours as needed for moderate pain (pain score 4-6). 03/09/19   [name], PA-C  primidone (MYSOLINE) 50 MG tablet Take 50 mg by mouth 2 (two) times daily.    [provider]  propranolol (INDERAL) 40 MG tablet Take 40 mg by mouth 2 (two) times daily.     [provider]  simvastatin (ZOCOR) 20 MG tablet Take 1 tablet by mouth every evening.    [provider]   tamsulosin (FLOMAX) 0.4 MG CAPS capsule Take 0.4 mg by mouth at bedtime.    [provider]  topiramate (TOPAMAX) 50 MG tablet Take 50-100 mg by mouth See admin instructions. Take 100 mg by mouth in am and 50 mg at lunch    [provider]  traMADol (ULTRAM) 50 MG tablet Take 1-2 tablets (50-100 mg total) by mouth every  4 (four) hours as needed for moderate pain. 03/09/19   [name], PA-C  zolpidem (AMBIEN) 5 MG tablet Take 5 mg by mouth at bedtime.     [provider]   No results found.  Positive ROS: All other systems have been reviewed and were otherwise negative with the exception of those mentioned in the HPI and as above.  Physical Exam: General: Well developed, well nourished obese male seen in moderate discomfort. HEENT: Atraumatic and normocephalic. Sclera are clear. Extraocular motion is intact. Oropharynx is clear with moist mucosa. Neck: Supple, nontender, good range of motion. No JVD or carotid bruits. Lungs: Clear to auscultation bilaterally. Cardiovascular: Regular rate and rhythm with normal S1 and S2. No murmurs. No gallops or rubs. Pedal pulses are palpable bilaterally. Homans test is negative bilaterally.  Moderate pretibial and ankle edema. Abdomen: Soft, nontender, and nondistended. Bowel sounds are present. Skin: No lesions in the area of chief complaint Neurologic: Awake, alert, and oriented. Sensory function is grossly intact. Motor strength is felt to be 5 over 5 bilaterally. No clonus or tremor. Good motor coordination. Lymphatic: No axillary or cervical lymphadenopathy  MUSCULOSKELETAL: Examination of the right knee shows the surgical incision to be well approximated.  Steri-Strips are in place.  Generalized soft tissue swelling is appreciated.  There is tenderness to palpation about the patella and extending down the patellar tendon.  No palpable defect was appreciated in the extensor mechanism more in the patellar tendon region.  The  patient had difficulty with maintaining full extension.  He was able to fire his quadriceps.  The knee was stable to varus and valgus stress.  Assessment: Probable right patellar tendon rupture Status post right total knee arthroplasty  Plan: The findings were discussed in detail with the patient.  Due to the degree of soft tissue swelling, it is difficult to discern if there is a tear of the patellar tendon.  The patient will be admitted for pain control and further diagnostics with anticipated right patellar tendon repair pending the diagnostic findings.  I will order an ultrasound of the knee with special attention to the infrapatellar region.  Orders will be placed for a knee immobilizer as well as Polar Care to the knee to decrease swelling.  Khia Dieterich P. Holley Bouche M.D.

## 2019-03-26 ENCOUNTER — Inpatient Hospital Stay: Payer: Medicare Other

## 2019-03-26 LAB — GLUCOSE, CAPILLARY
Glucose-Capillary: 53 mg/dL — ABNORMAL LOW (ref 70–99)
Glucose-Capillary: 57 mg/dL — ABNORMAL LOW (ref 70–99)
Glucose-Capillary: 89 mg/dL (ref 70–99)

## 2019-03-26 LAB — SARS CORONAVIRUS 2 (TAT 6-24 HRS): SARS Coronavirus 2: NEGATIVE

## 2019-03-26 NOTE — Progress Notes (Signed)
Pt refused towel roles under ankles. Replaced steri strips from [place] removing

## 2019-03-26 NOTE — Progress Notes (Signed)
Inpatient Diabetes Program Recommendations  AACE/ADA: New Consensus Statement on Inpatient Glycemic Control (2015)  Target Ranges:  Prepandial:   less than 140 mg/dL      Peak postprandial:   less than 180 mg/dL (1-2 hours)      Critically ill patients:  140 - 180 mg/dL   Lab Results  Component Value Date   GLUCAP 53 (L) 03/26/2019   HGBA1C 4.8 03/05/2019    Review of Glycemic Control  Diabetes history: DM 2 Outpatient Diabetes medications: Metformin 250 mg bid  Current orders for Inpatient glycemic control: Metformin 250 mg bid, Novolog 0-15 units tid  Noted A1c 4.8% on 03/05/19 (level too low for age, may indicate hypoglycemia at home)  Inpatient Diabetes Program Recommendations:    Hypoglycemia of 53.   Consider d/cing metformin and decreasing Novolog Correction scale to 0-9 units tid + Novolog 0-5 units qhs for bedtime coverage if needed.  Thanks,  Tama Headings RN, MSN, BC-ADM Inpatient Diabetes Coordinator Team Pager [phone] (8a-5p)

## 2019-03-27 ENCOUNTER — Encounter: Payer: Self-pay | Admitting: Anesthesiology

## 2019-03-27 LAB — GLUCOSE, CAPILLARY
Glucose-Capillary: 89 mg/dL (ref 70–99)
Glucose-Capillary: 84 mg/dL (ref 70–99)
Glucose-Capillary: 74 mg/dL (ref 70–99)
Glucose-Capillary: 102 mg/dL — ABNORMAL HIGH (ref 70–99)

## 2019-03-27 MED ORDER — CLONAZEPAM 0.25 MG PO TBDP
0.2500 mg | ORAL_TABLET | Freq: Two times a day (BID) | ORAL | Status: DC
  Administered 2019-03-28 – 2019-04-01 (×6): 0.25 mg via ORAL
  Filled 2019-03-27 (×9): qty 1

## 2019-03-27 MED ORDER — CEFAZOLIN SODIUM-DEXTROSE 2-4 GM/100ML-% IV SOLN
2.0000 g | INTRAVENOUS | Status: AC
  Administered 2019-03-28: 13:00:00 2 g via INTRAVENOUS

## 2019-03-27 NOTE — Anesthesia Preprocedure Evaluation (Addendum)
Anesthesia Evaluation  Patient identified by MRN, date of birth, ID band Patient awake    Reviewed: Allergy & Precautions, H&P , NPO status , Patient's Chart, lab work & pertinent test results, reviewed documented beta blocker date and time   Airway Mallampati: II  TM Distance: >3 FB Neck ROM: full    Dental  (+) Chipped   Pulmonary neg pulmonary ROS, sleep apnea (improved since gastric bypass) ,    Pulmonary exam normal        Cardiovascular Exercise Tolerance: Poor hypertension, On Medications + Orthopnea  negative cardio ROS Normal cardiovascular exam Rhythm:regular Rate:Normal     Neuro/Psych PSYCHIATRIC DISORDERS Anxiety Depression H/o spinal stenosis, unclear severity.  Initially pt denies any neurologic symptoms in LE then notes occasional mild burning sensation in left foot that resolves within 30 seconds negative neurological ROS  negative psych ROS   GI/Hepatic negative GI ROS, Neg liver ROS, hiatal hernia (s/p repair), PUD, GERD  Controlled,S/p gastric bypass   Endo/Other  negative endocrine ROSdiabetesMorbid obesity (BMI 41)  Renal/GU Renal disease  negative genitourinary   Musculoskeletal   Abdominal   Peds  Hematology negative hematology ROS (+)   Anesthesia Other Findings Past Medical History: No date: Arthritis No date: Cervical spinal stenosis No date: Diabetes mellitus without complication ([facility]) No date: Essential tremor No date: GERD (gastroesophageal reflux disease) No date: History of hiatal hernia     Comment:  fixed with gastric bypass surgery No date: History of kidney stones No date: Hypertension No date: Hypoglycemia No date: Sleep apnea     Comment:  had gastric bypass and no longer uses cpap machine No date: Spinal stenosis, lumbar  Past Surgical History: No date: FRACTURE SURGERY; Left     Comment:  age 66 No date: GASTRIC BYPASS     Comment:  fixed hiatal hernia No date:  SKIN SURGERY     Comment:  after gastric bypass and pt became septic and had wound               vac placed No date: VENA CAVA FILTER PLACEMENT     Comment:  WAS PLACED DURING PTS GASTRIC BYPASS SURGERY PER PT  BMI    Body Mass Index: 41.23 kg/m      Reproductive/Obstetrics negative OB ROS                            Anesthesia Physical  Anesthesia Plan  ASA: III  Anesthesia Plan: Spinal   Post-op Pain Management:    Induction:   PONV Risk Score and Plan: 2 and Propofol infusion  Airway Management Planned: Simple Face Mask and Natural Airway  Additional Equipment:   Intra-op Plan:   Post-operative Plan:   Informed Consent: I have reviewed the patients History and Physical, chart, labs and discussed the procedure including the risks, benefits and alternatives for the proposed anesthesia with the patient or authorized representative who has indicated his/her understanding and acceptance.     Dental Advisory Given  Plan Discussed with: Anesthesiologist and CRNA  Anesthesia Plan Comments: (Discussed risks and benefits of spinal anesthesia, including an overall low but still increased risk of worsening paresthesias.  Pt understands and consents to spinal.)       Anesthesia Quick Evaluation

## 2019-03-27 NOTE — TOC Progression Note (Signed)
Transition of Care [facility]) - Progression Note    Patient Details  Name: Shaddai Puglia MRN: [id number] Date of Birth: 1945-12-31  Transition of [facility]) CM/SW Contact  Su Hilt, RN Phone Number: 03/27/2019, 9:33 AM  Clinical Narrative:     Requested the price of Lovenox will notify the patient once obtained       Expected Discharge Plan and Services                                                 Social Determinants of Health (SDOH) Interventions    Readmission Risk Interventions No flowsheet data found.

## 2019-03-27 NOTE — Progress Notes (Signed)
Inpatient Diabetes Program Recommendations  AACE/ADA: New Consensus Statement on Inpatient Glycemic Control (2015)  Target Ranges:  Prepandial:   less than 140 mg/dL      Peak postprandial:   less than 180 mg/dL (1-2 hours)      Critically ill patients:  140 - 180 mg/dL   Lab Results  Component Value Date   GLUCAP 89 03/27/2019   HGBA1C 4.8 03/05/2019    Review of Glycemic Control Results for Curtis Alexander, Curtis Alexander (MRN [id number]) as of 03/27/2019 10:53  Ref. Range 03/25/2019 22:11 03/26/2019 12:37 03/26/2019 16:42 03/26/2019 21:27 03/27/2019 07:39  Glucose-Capillary Latest Ref Range: 70 - 99 mg/dL 198 (H) 53 (L) 57 (L) 89 89   Diabetes history: DM 2 Outpatient Diabetes medications: Metformin 250 mg bid  Current orders for Inpatient glycemic control: Metformin 250 mg bid, Novolog 0-15 units tid  Noted A1c 4.8% on 03/05/19 (level too low for age, may indicate hypoglycemia at home)  Inpatient Diabetes Program Recommendations:    Repeated Hypoglycemia in the 50's not requiring insulin per scale.   Patient getting home metformin. Clearly it looks patient is having hypoglycemia while on metfmrin at home. Consider reducing Metformin dose to 250 mg Daily (could probably d/c metformin) and have him follow up with ordering provider.  Do not want to risk hypoglycemia at home especially if he lives alone.  Thanks,  Tama Headings RN, MSN, BC-ADM Inpatient Diabetes Coordinator Team Pager [phone] (8a-5p)

## 2019-03-27 NOTE — Progress Notes (Signed)
Day 2 patient refusing TEDS and aspirin. Will notify MD

## 2019-03-27 NOTE — TOC Benefit Eligibility Note (Signed)
Transition of Care [facility]) Benefit Eligibility Note    Patient Details  Name: Kerel Zeiders MRN: [id number] Date of Birth: 1945/10/11   Medication/Dose: Lovenox 40 mg daily x 14 days or generic           Spoke with Person/Company/Phone Number:: [phone] automated process couldn't find policy, transferred to [phone] - no representative available. [employer] said all representatives where busy & gave me [phone], auto. system doesn't recognize policy ID  Co-Pay: Unable to obtain             [place] Phone Number: 03/27/2019, 3:54 PM

## 2019-03-27 NOTE — Progress Notes (Signed)
  Subjective:    Patient reports pain as mild.   Patient seen in rounds with Dr. Marry Guan. Patient is well, and has had no acute complaints or problems Plan is to go home versus Rehab after hospital stay. Negative for chest pain and shortness of breath Fever: no Gastrointestinal: Negative for nausea and vomiting  Objective: Vital signs in last 24 hours: Temp:  [97.6 F (36.4 C)-98.5 F (36.9 C)] 98.5 F (36.9 C) (09/02 2320) Pulse Rate:  [58-77] 62 (09/02 2320) Resp:  [18-20] 18 (09/02 2320) BP: (105-110)/(54-79) 106/54 (09/02 2320) SpO2:  [99 %-100 %] 99 % (09/02 2320) Weight:  [121 kg-121.3 kg] 121 kg (09/02 1708)  Intake/Output from previous day:  Intake/Output Summary (Last 24 hours) at 03/27/2019 0705 Last data filed at 03/27/2019 [id number] Gross per 24 hour  Intake -  Output 2025 ml  Net -2025 ml    Intake/Output this shift: No intake/output data recorded.  Labs: No results for input(s): HGB in the last 72 hours. No results for input(s): WBC, RBC, HCT, PLT in the last 72 hours. No results for input(s): NA, K, CL, CO2, BUN, CREATININE, GLUCOSE, CALCIUM in the last 72 hours. No results for input(s): LABPT, INR in the last 72 hours.   EXAM General - Patient is Alert and Oriented Extremity - Sensation intact distally Intact pulses distally Dorsiflexion/Plantar flexion intact Dressing/Incision -knee immobilizer and Polar Care on right lower extremity. Motor Function - intact, moving foot and toes well on exam.   Past Medical History:  Diagnosis Date  . Arthritis   . Cervical spinal stenosis   . Diabetes mellitus without complication ([place])   . Essential tremor   . GERD (gastroesophageal reflux disease)   . History of hiatal hernia    fixed with gastric bypass surgery  . History of kidney stones   . Hypertension   . Hypoglycemia   . Non-traumatic rupture of right patellar tendon 03/25/2019  . Sleep apnea    had gastric bypass and no longer uses cpap machine  . Spinal  stenosis, lumbar     Assessment/Plan:    Active Problems:   Non-traumatic rupture of right patellar tendon  Estimated body mass index is 40.56 kg/m as calculated from the following:   Height as of this encounter: 5\' 8"  (1.727 m).   Weight as of this encounter: 121 kg. Plan for surgery tomorrow for patella tendon rupture repair.  DVT Prophylaxis - Aspirin and TED hose Knee immobilizer on at all times.  Plan for surgery tomorrow.  [name], PA-C Orthopaedic Surgery 03/27/2019, 7:05 AM

## 2019-03-28 ENCOUNTER — Encounter
Admission: AD | Disposition: A | Discharge status: Skilled Nursing Facility | Payer: Self-pay | Source: Ambulatory Visit | Attending: Orthopedic Surgery

## 2019-03-28 ENCOUNTER — Inpatient Hospital Stay: Payer: Medicare Other | Admitting: Anesthesiology

## 2019-03-28 ENCOUNTER — Encounter: Payer: Self-pay | Admitting: Anesthesiology

## 2019-03-28 HISTORY — PX: PATELLAR TENDON REPAIR: SHX737

## 2019-03-28 LAB — APTT: aPTT: 28 seconds (ref 24–36)

## 2019-03-28 LAB — BASIC METABOLIC PANEL
Anion gap: 6 (ref 5–15)
BUN: 21 mg/dL (ref 8–23)
CO2: 25 mmol/L (ref 22–32)
Calcium: 8.3 mg/dL — ABNORMAL LOW (ref 8.9–10.3)
Chloride: 110 mmol/L (ref 98–111)
Creatinine, Ser: 0.71 mg/dL (ref 0.61–1.24)
GFR calc Af Amer: >60 mL/min (ref >60)
GFR calc non Af Amer: >60 mL/min (ref >60)
Glucose, Bld: 94 mg/dL (ref 70–99)
Potassium: 4.2 mmol/L (ref 3.5–5.1)
Sodium: 141 mmol/L (ref 135–145)

## 2019-03-28 LAB — CBC WITH DIFFERENTIAL/PLATELET
Abs Immature Granulocytes: 0.02 10*3/uL (ref 0.00–0.07)
Basophils Absolute: 0 10*3/uL (ref 0.0–0.1)
Basophils Relative: 1 %
Eosinophils Absolute: 0.2 10*3/uL (ref 0.0–0.5)
Eosinophils Relative: 5 %
HCT: 37.6 % — ABNORMAL LOW (ref 39.0–52.0)
Hemoglobin: 11.6 g/dL — ABNORMAL LOW (ref 13.0–17.0)
Immature Granulocytes: 0 %
Lymphocytes Relative: 37 %
Lymphs Abs: 1.9 10*3/uL (ref 0.7–4.0)
MCH: 30 pg (ref 26.0–34.0)
MCHC: 30.9 g/dL (ref 30.0–36.0)
MCV: 97.2 fL (ref 80.0–100.0)
Monocytes Absolute: 0.6 10*3/uL (ref 0.1–1.0)
Monocytes Relative: 11 %
Neutro Abs: 2.5 10*3/uL (ref 1.7–7.7)
Neutrophils Relative %: 46 %
Platelets: 185 10*3/uL (ref 150–400)
RBC: 3.87 MIL/uL — ABNORMAL LOW (ref 4.22–5.81)
RDW: 13.7 % (ref 11.5–15.5)
WBC: 5.2 10*3/uL (ref 4.0–10.5)
nRBC: 0 % (ref 0.0–0.2)

## 2019-03-28 LAB — GLUCOSE, CAPILLARY
Glucose-Capillary: 95 mg/dL (ref 70–99)
Glucose-Capillary: 79 mg/dL (ref 70–99)
Glucose-Capillary: 83 mg/dL (ref 70–99)
Glucose-Capillary: 68 mg/dL — ABNORMAL LOW (ref 70–99)
Glucose-Capillary: 57 mg/dL — ABNORMAL LOW (ref 70–99)

## 2019-03-28 LAB — PROTIME-INR
INR: 1.2 (ref 0.8–1.2)
Prothrombin Time: 15.1 seconds (ref 11.4–15.2)

## 2019-03-28 SURGERY — REPAIR, TENDON, PATELLAR
Anesthesia: Spinal | Site: Knee | Laterality: Right

## 2019-03-28 MED ORDER — TRAMADOL HCL 50 MG PO TABS
50.0000 mg | ORAL_TABLET | ORAL | Status: DC | PRN
  Administered 2019-03-28 – 2019-04-01 (×3): 100 mg via ORAL
  Filled 2019-03-28 (×3): qty 2

## 2019-03-28 MED ORDER — METOCLOPRAMIDE HCL 10 MG PO TABS: 5.0000 mg | ORAL_TABLET | Freq: Three times a day (TID) | ORAL | Status: DC | PRN

## 2019-03-28 MED ORDER — MIDAZOLAM HCL 2 MG/2ML IJ SOLN
INTRAMUSCULAR | Status: AC
  Filled 2019-03-28: qty 2

## 2019-03-28 MED ORDER — DEXTROSE-NACL 5-0.2 % IV SOLN
INTRAVENOUS | Status: DC | PRN
  Administered 2019-03-28: 12:00:00 via INTRAVENOUS

## 2019-03-28 MED ORDER — ACETAMINOPHEN 10 MG/ML IV SOLN
1000.0000 mg | Freq: Four times a day (QID) | INTRAVENOUS | Status: AC
  Administered 2019-03-28 – 2019-03-29 (×4): 1000 mg via INTRAVENOUS
  Filled 2019-03-28 (×4): qty 100

## 2019-03-28 MED ORDER — OXYCODONE HCL 5 MG PO TABS
10.0000 mg | ORAL_TABLET | ORAL | Status: DC | PRN
  Administered 2019-03-28 – 2019-03-29 (×4): 10 mg via ORAL
  Filled 2019-03-28 (×5): qty 2

## 2019-03-28 MED ORDER — ONDANSETRON HCL 4 MG/2ML IJ SOLN: 4.0000 mg | Freq: Four times a day (QID) | INTRAMUSCULAR | Status: DC | PRN

## 2019-03-28 MED ORDER — PROPOFOL 10 MG/ML IV BOLUS
INTRAVENOUS | Status: DC | PRN
  Administered 2019-03-28 (×2): 40 mg via INTRAVENOUS

## 2019-03-28 MED ORDER — OXYCODONE HCL 5 MG PO TABS
5.0000 mg | ORAL_TABLET | ORAL | Status: DC | PRN
  Administered 2019-03-28 – 2019-03-30 (×3): 5 mg via ORAL
  Filled 2019-03-28 (×2): qty 1

## 2019-03-28 MED ORDER — EPHEDRINE SULFATE 50 MG/ML IJ SOLN
INTRAMUSCULAR | Status: DC | PRN
  Administered 2019-03-28: 10 mg via INTRAVENOUS
  Administered 2019-03-28: 5 mg via INTRAVENOUS
  Administered 2019-03-28 (×3): 10 mg via INTRAVENOUS
  Administered 2019-03-28: 15 mg via INTRAVENOUS

## 2019-03-28 MED ORDER — DEXTROSE 50 % IV SOLN
INTRAVENOUS | Status: AC
  Administered 2019-03-28: 15 mL via INTRAVENOUS
  Filled 2019-03-28: qty 50

## 2019-03-28 MED ORDER — LACTATED RINGERS IV SOLN
INTRAVENOUS | Status: DC | PRN
  Administered 2019-03-28: 12:00:00 via INTRAVENOUS

## 2019-03-28 MED ORDER — SODIUM CHLORIDE 0.9 % IV SOLN
INTRAVENOUS | Status: DC | PRN
  Administered 2019-03-28: 13:00:00 50 ug/min via INTRAVENOUS

## 2019-03-28 MED ORDER — CEFAZOLIN SODIUM-DEXTROSE 2-4 GM/100ML-% IV SOLN
2.0000 g | Freq: Four times a day (QID) | INTRAVENOUS | Status: AC
  Administered 2019-03-28 – 2019-03-29 (×4): 2 g via INTRAVENOUS
  Filled 2019-03-28 (×4): qty 100

## 2019-03-28 MED ORDER — ACETAMINOPHEN 325 MG PO TABS: 325.0000 mg | ORAL_TABLET | Freq: Four times a day (QID) | ORAL | Status: DC | PRN

## 2019-03-28 MED ORDER — HYDROMORPHONE HCL 1 MG/ML IJ SOLN
0.5000 mg | INTRAMUSCULAR | Status: DC | PRN
  Administered 2019-03-28: 20:00:00 0.5 mg via INTRAVENOUS
  Filled 2019-03-28 (×2): qty 1

## 2019-03-28 MED ORDER — ALUM & MAG HYDROXIDE-SIMETH 200-200-20 MG/5ML PO SUSP: 30.0000 mL | ORAL | Status: DC | PRN

## 2019-03-28 MED ORDER — ENOXAPARIN SODIUM 30 MG/0.3ML ~~LOC~~ SOLN
30.0000 mg | Freq: Two times a day (BID) | SUBCUTANEOUS | Status: DC
  Administered 2019-03-29 – 2019-04-01 (×7): 30 mg via SUBCUTANEOUS
  Filled 2019-03-28 (×7): qty 0.3

## 2019-03-28 MED ORDER — ACETAMINOPHEN 10 MG/ML IV SOLN
INTRAVENOUS | Status: AC
  Filled 2019-03-28: qty 100

## 2019-03-28 MED ORDER — METOCLOPRAMIDE HCL 5 MG/ML IJ SOLN: 5.0000 mg | Freq: Three times a day (TID) | INTRAMUSCULAR | Status: DC | PRN

## 2019-03-28 MED ORDER — FENTANYL CITRATE (PF) 100 MCG/2ML IJ SOLN: 25.0000 ug | INTRAMUSCULAR | Status: DC | PRN

## 2019-03-28 MED ORDER — GABAPENTIN 300 MG PO CAPS
300.0000 mg | ORAL_CAPSULE | Freq: Every day | ORAL | Status: DC
  Administered 2019-03-28 – 2019-03-31 (×4): 300 mg via ORAL
  Filled 2019-03-28 (×4): qty 1

## 2019-03-28 MED ORDER — DEXTROSE 50 % IV SOLN
15.0000 mL | Freq: Once | INTRAVENOUS | Status: AC
  Administered 2019-03-28: 12:00:00 15 mL via INTRAVENOUS

## 2019-03-28 MED ORDER — SODIUM CHLORIDE 0.9 % IV SOLN
INTRAVENOUS | Status: DC
  Administered 2019-03-29: 14:00:00 via INTRAVENOUS

## 2019-03-28 MED ORDER — SENNOSIDES-DOCUSATE SODIUM 8.6-50 MG PO TABS
1.0000 | ORAL_TABLET | Freq: Two times a day (BID) | ORAL | Status: DC
  Administered 2019-03-28 – 2019-03-31 (×6): 1 via ORAL
  Filled 2019-03-28 (×9): qty 1

## 2019-03-28 MED ORDER — FENTANYL CITRATE (PF) 100 MCG/2ML IJ SOLN
INTRAMUSCULAR | Status: AC
  Filled 2019-03-28: qty 2

## 2019-03-28 MED ORDER — ONDANSETRON HCL 4 MG/2ML IJ SOLN: 4.0000 mg | Freq: Once | INTRAMUSCULAR | Status: DC | PRN

## 2019-03-28 MED ORDER — PANTOPRAZOLE SODIUM 40 MG PO TBEC
40.0000 mg | DELAYED_RELEASE_TABLET | Freq: Two times a day (BID) | ORAL | Status: DC
  Administered 2019-03-28 – 2019-04-01 (×8): 40 mg via ORAL
  Filled 2019-03-28 (×8): qty 1

## 2019-03-28 MED ORDER — PHENOL 1.4 % MT LIQD
1.0000 | OROMUCOSAL | Status: DC | PRN
  Filled 2019-03-28: qty 177

## 2019-03-28 MED ORDER — MAGNESIUM HYDROXIDE 400 MG/5ML PO SUSP
30.0000 mL | Freq: Every day | ORAL | Status: DC
  Administered 2019-03-29 – 2019-03-31 (×3): 30 mL via ORAL
  Filled 2019-03-28 (×4): qty 30

## 2019-03-28 MED ORDER — METOCLOPRAMIDE HCL 10 MG PO TABS
10.0000 mg | ORAL_TABLET | Freq: Three times a day (TID) | ORAL | Status: AC
  Administered 2019-03-28 – 2019-03-30 (×7): 10 mg via ORAL
  Filled 2019-03-28 (×8): qty 1

## 2019-03-28 MED ORDER — NEOMYCIN-POLYMYXIN B GU 40-200000 IR SOLN
Status: DC | PRN
  Administered 2019-03-28: 4 mL

## 2019-03-28 MED ORDER — ONDANSETRON HCL 4 MG PO TABS: 4.0000 mg | ORAL_TABLET | Freq: Four times a day (QID) | ORAL | Status: DC | PRN

## 2019-03-28 MED ORDER — ACETAMINOPHEN 10 MG/ML IV SOLN
INTRAVENOUS | Status: DC | PRN
  Administered 2019-03-28: 1000 mg via INTRAVENOUS

## 2019-03-28 MED ORDER — PROPOFOL 500 MG/50ML IV EMUL
INTRAVENOUS | Status: DC | PRN
  Administered 2019-03-28: 50 ug/kg/min via INTRAVENOUS

## 2019-03-28 MED ORDER — PHENYLEPHRINE HCL (PRESSORS) 10 MG/ML IV SOLN
INTRAVENOUS | Status: DC | PRN
  Administered 2019-03-28 (×3): 100 ug via INTRAVENOUS

## 2019-03-28 MED ORDER — DIPHENHYDRAMINE HCL 12.5 MG/5ML PO ELIX: 12.5000 mg | ORAL_SOLUTION | ORAL | Status: DC | PRN

## 2019-03-28 MED ORDER — MENTHOL 3 MG MT LOZG
1.0000 | LOZENGE | OROMUCOSAL | Status: DC | PRN
  Filled 2019-03-28: qty 9

## 2019-03-28 MED ORDER — SODIUM CHLORIDE 0.9 % IV SOLN
INTRAVENOUS | Status: DC
  Administered 2019-03-28 (×3): via INTRAVENOUS

## 2019-03-28 MED ORDER — GLYCOPYRROLATE 0.2 MG/ML IJ SOLN
INTRAMUSCULAR | Status: DC | PRN
  Administered 2019-03-28: 0.2 mg via INTRAVENOUS

## 2019-03-28 MED ORDER — CEFAZOLIN SODIUM-DEXTROSE 2-4 GM/100ML-% IV SOLN
INTRAVENOUS | Status: AC
  Filled 2019-03-28: qty 100

## 2019-03-28 MED ORDER — BISACODYL 10 MG RE SUPP: 10.0000 mg | Freq: Every day | RECTAL | Status: DC | PRN

## 2019-03-28 MED ORDER — BUPIVACAINE IN DEXTROSE 0.75-8.25 % IT SOLN
INTRATHECAL | Status: DC | PRN
  Administered 2019-03-28: 3 mL via INTRATHECAL

## 2019-03-28 MED ORDER — FLEET ENEMA 7-19 GM/118ML RE ENEM: 1.0000 | ENEMA | Freq: Once | RECTAL | Status: DC | PRN

## 2019-03-28 SURGICAL SUPPLY — 47 items
ANCHOR SUT BIO SW 4.75X19.1 (Anchor) ×8 IMPLANT
ANCHOR SWIVELOCK BIO COMP (Anchor) ×2 IMPLANT
BRACE KNEE POST OP SHORT (BRACE) ×2 IMPLANT
CANISTER SUCT 1200ML W/VALVE (MISCELLANEOUS) ×3 IMPLANT
CATH COUDE FOLEY 2W 5CC 16FR (CATHETERS) IMPLANT
CATH COUDE FOLEY 5CC 14FR (CATHETERS) ×2 IMPLANT
COVER WAND RF STERILE (DRAPES) ×3 IMPLANT
CUFF TOURN SGL QUICK 24 (TOURNIQUET CUFF)
CUFF TOURN SGL QUICK 30 (TOURNIQUET CUFF)
CUFF TRNQT CYL 24X4X16.5-23 (TOURNIQUET CUFF) IMPLANT
CUFF TRNQT CYL 30X4X21-28X (TOURNIQUET CUFF) IMPLANT
DRSG DERMACEA 8X12 NADH (GAUZE/BANDAGES/DRESSINGS) ×3 IMPLANT
DURAPREP 26ML APPLICATOR (WOUND CARE) ×3 IMPLANT
ELECT CAUTERY BLADE 6.4 (BLADE) ×3 IMPLANT
ELECT REM PT RETURN 9FT ADLT (ELECTROSURGICAL) ×3
ELECTRODE REM PT RTRN 9FT ADLT (ELECTROSURGICAL) ×1 IMPLANT
GAUZE SPONGE 4X4 12PLY STRL (GAUZE/BANDAGES/DRESSINGS) ×3 IMPLANT
GLOVE BIOGEL M STRL SZ7.5 (GLOVE) ×3 IMPLANT
GLOVE INDICATOR 8.0 STRL GRN (GLOVE) ×3 IMPLANT
GOWN STRL REUS W/ TWL LRG LVL3 (GOWN DISPOSABLE) ×1 IMPLANT
GOWN STRL REUS W/TWL LRG LVL3 (GOWN DISPOSABLE) ×2
IMMOB KNEE 24 THIGH 24 443303 (SOFTGOODS) ×1 IMPLANT
KIT TURNOVER KIT A (KITS) ×3 IMPLANT
MARKER SKIN DUAL TIP RULER LAB (MISCELLANEOUS) ×2 IMPLANT
NDL HYPO 25X1 1.5 SAFETY (NEEDLE) ×1 IMPLANT
NEEDLE HYPO 25X1 1.5 SAFETY (NEEDLE) ×3 IMPLANT
NS IRRIG 500ML POUR BTL (IV SOLUTION) ×3 IMPLANT
PACK TOTAL KNEE (MISCELLANEOUS) ×3 IMPLANT
SOL PREP PVP 2OZ (MISCELLANEOUS) ×3
SOLUTION PREP PVP 2OZ (MISCELLANEOUS) ×1 IMPLANT
STAPLER SKIN PROX 35W (STAPLE) ×3 IMPLANT
STOCKINETTE IMPERV 14X48 (MISCELLANEOUS) ×2 IMPLANT
SUCTION FRAZIER HANDLE 10FR (MISCELLANEOUS) ×2
SUCTION TUBE FRAZIER 10FR DISP (MISCELLANEOUS) IMPLANT
SUT TIGER TAPE 7 IN WHITE (SUTURE) ×2 IMPLANT
SUT VIC AB 0 CT1 27 (SUTURE) ×2
SUT VIC AB 0 CT1 27XCR 8 STRN (SUTURE) ×1 IMPLANT
SUT VIC AB 0 CT1 36 (SUTURE) ×3 IMPLANT
SUT VIC AB 2-0 CT1 27 (SUTURE) ×2
SUT VIC AB 2-0 CT1 TAPERPNT 27 (SUTURE) ×1 IMPLANT
SUTURE TAPE 1.3 40 TPR END (SUTURE) IMPLANT
SUTURETAPE 1.3 40 TPR END (SUTURE) ×6
SYR 10ML LL (SYRINGE) ×3 IMPLANT
SYR 30ML LL (SYRINGE) ×3 IMPLANT
SYS INTERNAL BRACE KNEE (Miscellaneous) ×3 IMPLANT
SYSTEM IMPL ACL/PCL SWIVILLOCK (Anchor) ×2 IMPLANT
SYSTEM INTERNAL BRACE KNEE (Miscellaneous) ×1 IMPLANT

## 2019-03-28 NOTE — Progress Notes (Signed)
Notified Dr. Marry Guan of patient's refusal to have heel on operative leg elevated off the bed. Also notified that patient's polar care would not get cold on the return from PACU. This writer replaced the polar care.

## 2019-03-28 NOTE — Progress Notes (Signed)
Pt SBP upper 80s-mid 90s. HR upper 40s-50s. Pt has history of hypotension and bradycardia post surgery. Notified Dr. Amie Critchley; reviewed BP and HR readings. Per MD, nothing needed at this time. Will continue to assess.

## 2019-03-28 NOTE — Progress Notes (Signed)
ORTHOPAEDICS: The findings were again discussed in detail with the patient.  The patient expressed understanding of the risks and benefits and agreed with plans for surgical intervention.   The surgical site was signed as per the "right site surgery" protocol.   [name] M.D.

## 2019-03-28 NOTE — Anesthesia Post-op Follow-up Note (Signed)
Anesthesia QCDR form completed.        

## 2019-03-28 NOTE — Op Note (Signed)
OPERATIVE NOTE  DATE OF SURGERY:  03/28/2019  PATIENT NAME:  Curtis Alexander   DOB: 22-Feb-1946  MRN: [id number]   PRE-OPERATIVE DIAGNOSIS: Right patellar tendon rupture (from the tibial tubercle)  POST-OPERATIVE DIAGNOSIS:  Same  PROCEDURE: Repair of the right patellar tendon rupture  SURGEON:  Marciano Sequin., M.D.   ANESTHESIA: spinal  ESTIMATED BLOOD LOSS: 10 mL  FLUIDS REPLACED: 1700 mL of crystalloid  TOURNIQUET TIME: 90 minutes   IMPLANTS UTILIZED: Arthrex 4.75 x 19.1 mm SwivelLock suture anchor x3, 5.5 x 19.1 mm SwivelLock , FiberTape  INDICATIONS FOR SURGERY: Curtis Alexander is a 73 y.o. year old male who previously underwent right total knee arthroplasty approximately 2 to 3 weeks ago.  After returning home from [place], the patient had an episode where the knee gave way and he "felt a pop".  He had difficulty with extending the knee.  Ultrasound demonstrated findings consistent with a rupture of the patellar tendon.  After discussion of the risks and benefits of surgical intervention, the patient expressed understanding of the risks benefits and agree with plans for repair of the patellar tendon rupture.   PROCEDURE IN DETAIL: The patient was brought into the operating room and, after adequate spinal anesthesia was achieved, a tourniquet was placed on the patient's upper right thigh.  The patient's right knee and leg were cleaned and prepped with alcohol and DuraPrep and draped in the usual sterile fashion.  A "timeout" was performed as per usual protocol.  The right lower extremity was exsanguinated using an Esmarch, and the tourniquet was inflated to 300 mmHg.  An anterior longitudinal incision was made in line with the previous surgical incision and extended distally.  A moderate hematoma was evacuated.  Inspection of the patellar tendon demonstrated a rupture at the insertion to the tibial tubercle with a fragment of bone still attached.  The wound was irrigated  with copious amounts of normal saline with antibiotic solution.  The joint was also irrigated with antibiotic solution.  Using Bradley, Krakw sutures were placed in the patellar tendon creating a medial and lateral bundle.  After appropriate tensioning, the Fibertape was secured to the proximal tibia with two 4.75 x 19.1 mm swivel lock suture anchors.  The knee was placed through gentle flexion with good maintenance of the repair.  Next, a 5.5 mm x 19.1 mm swivel lock suture anchor was attached to the medial aspect of the patella and a 4.75 x 19.1 mm swivel lock suture anchor was applied to the lateral aspect of the patella.  The associated fiber tape and FiberWire was then brought distally paralleling the medial and lateral borders of the patellar tendon.  The bundles were then attached to the proximal tibia using two 4.75 x 19.1 mm swivel lock suture anchors.  This allowed for tension be taken off of the patellar tendon repair.  The knee was placed through gentle range of motion with good maintenance of the repair.  The tourniquet was deflated after total tourniquet time of 90 minutes.  Hemostasis was achieved using electrocautery.  The wound was irrigated with copious amounts of normal saline with antibiotic solution and suctioned dry.  The lateral border of the patellar tendon was reinforced using interrupted sutures of #0 Vicryl.  Subcutaneous tissue was approximated using first #0 Vicryl followed by #2-0 Vicryl.  The skin was closed with skin staples.  A sterile dressing was applied.  A knee range of motion was applied and locked in full extension.  The  patient tolerated the procedure well.  He was transported to the recovery room in stable condition.   [name] M.D.

## 2019-03-28 NOTE — Transfer of Care (Signed)
Immediate Anesthesia Transfer of Care Note  Patient: Curtis Alexander  Procedure(s) Performed: PATELLA TENDON REPAIR (Right Knee)  Patient Location: PACU  Anesthesia Type:Spinal  Level of Consciousness: awake, alert  and oriented  Airway & Oxygen Therapy: Patient Spontanous Breathing and Patient connected to face mask oxygen  Post-op Assessment: Report given to RN and Post -op Vital signs reviewed and stable  Post vital signs: Reviewed and stable  Last Vitals:  Vitals Value Taken Time  BP    Temp    Pulse    Resp 13 03/28/19 1526  SpO2    Vitals shown include unvalidated device data.  Last Pain:  Vitals:   03/28/19 1156  TempSrc: Temporal  PainSc: 0-No pain         Complications: No apparent anesthesia complications

## 2019-03-28 NOTE — Anesthesia Procedure Notes (Signed)
Spinal  Patient location during procedure: OR Start time: 03/28/2019 12:10 PM End time: 03/28/2019 12:18 PM Staffing Resident/CRNA: Nelda Marseille, CRNA Performed: resident/CRNA  Preanesthetic Checklist Completed: patient identified, site marked, surgical consent, pre-op evaluation, timeout performed, IV checked, risks and benefits discussed and monitors and equipment checked Spinal Block Patient position: sitting Prep: Betadine Patient monitoring: heart rate, continuous pulse ox, blood pressure and cardiac monitor Approach: midline Location: L3-4 Injection technique: single-shot Needle Needle type: Whitacre and Introducer  Needle gauge: 25 G Needle length: 9 cm Assessment Sensory level: T10 Additional Notes Negative paresthesia. Negative blood return. Positive free-flowing CSF. Expiration date of kit checked and confirmed. Patient tolerated procedure well, without complications.

## 2019-03-28 NOTE — Anesthesia Procedure Notes (Signed)
Date/Time: 03/28/2019 12:10 PM Performed by: Nelda Marseille, CRNA Pre-anesthesia Checklist: Patient identified, Emergency Drugs available, Suction available, Patient being monitored and Timeout performed Oxygen Delivery Method: Simple face mask

## 2019-03-29 LAB — GLUCOSE, CAPILLARY
Glucose-Capillary: 67 mg/dL — ABNORMAL LOW (ref 70–99)
Glucose-Capillary: 94 mg/dL (ref 70–99)
Glucose-Capillary: 71 mg/dL (ref 70–99)
Glucose-Capillary: 111 mg/dL — ABNORMAL HIGH (ref 70–99)

## 2019-03-29 NOTE — Progress Notes (Signed)
Pt alert and oriented able to make needs known. Participate with PT/OT/ pain controlled by PRN oxycodone PO . Tolerates ABT without adverse reactions. IV site clean dry and intact to LFA. Polar care connected and refilled by this Probation officer. Voiding without difficulty  , yellow clear urine , no odor noted in urinal . SCD's Ted hose in place. HOB at 59. In stable condition.

## 2019-03-29 NOTE — Progress Notes (Signed)
Pt refused to have CBG reassessed by NT states " I am not going to have it done while I am eating , you can come back in a hour".

## 2019-03-29 NOTE — Progress Notes (Signed)
Physical Therapy Treatment Patient Details Name: Curtis Alexander MRN: [id number] DOB: 03/22/1946 Today's Date: 03/29/2019    History of Present Illness Curtis Alexander is a 65yoM who comes to [facility] after a fall, wherein sustaining a Rt patellar ligament rupture, TKA same side ~2weeks prior. PMH bilat knee DJD, LSS, DM, HT.    PT Comments    Pt up in chair still upon return, reports he has been able to remain comfortable. Foot coloring appears more akin to the rest of the patient's complexion (rather than blanching purple earlier). Pt motivated to attempt some AMB, able to AMB 37ft in room and slow 180 degree turns. Pt reports he could probably have gone twice as far, but author notes decline in arm function and posture over 26ft. Rising to standing still requires extensive cues and ModA. Pain well managed. Brace tightened for AMB then made comfortable and unlocked at end of session. Pt progressing well.    Follow Up Recommendations  SNF     Equipment Recommendations  None recommended by PT    Recommendations for Other Services       Precautions / Restrictions Precautions Precautions: Fall;Knee Precaution Comments: BP monitoring, RN reports BP was low in early AM requiring hold of pain meds until approx ~30 mins before OT evaluation. Required Braces or Orthoses: Other Brace Knee Immobilizer - Left: On at all times(order specifies that PT can mobilize 30 degrees) Other Brace: hinged locking knee brace; locked when OOB, 0-30degrees permitted in bed. Restrictions Weight Bearing Restrictions: Yes RLE Weight Bearing: Weight bearing as tolerated    Mobility  Bed Mobility Overal bed mobility: Needs Assistance Bed Mobility: Supine to Sit     Supine to sit: Min guard Sit to supine: Min assist   General bed mobility comments: authro assists with RLE into bed  Transfers Overall transfer level: Needs assistance Equipment used: Rolling walker (2 wheeled) Transfers: Sit to/from  Stand Sit to Stand: Mod assist Stand pivot transfers: Mod assist       General transfer comment: takes 15-20sec to full establish standing. Pt assisted with pericare while he maintains balance standing.  Ambulation/Gait Ambulation/Gait assistance: Min guard;Supervision Gait Distance (Feet): 24 Feet Assistive device: Rolling walker (2 wheeled) Gait Pattern/deviations: Step-to pattern;Trunk flexed;Wide base of support     General Gait Details: 3-point gait, fwd flexed, heavy reliance on BUE, noted severe Left knee bowing in stance phase. Pain well managed.   Stairs             Wheelchair Mobility    Modified Rankin (Stroke Patients Only)       Balance Overall balance assessment: Modified Independent;Mild deficits observed, not formally tested Sitting-balance support: No upper extremity supported;Feet supported Sitting balance-Leahy Scale: Good Sitting balance - Comments: steady sitting reaching within BOS   Standing balance support: During functional activity Standing balance-Leahy Scale: Fair Standing balance comment: heavy wt bearing through UEs on FWW                            Cognition Arousal/Alertness: Awake/alert Behavior During Therapy: WFL for tasks assessed/performed Overall Cognitive Status: Within Functional Limits for tasks assessed                                 General Comments: pleasant disposition      Exercises Other Exercises Other Exercises: OT facilitates education re: potential to use AE for LB ADLs (  pt already somewhat familiar due to recent rehab stay). Verbal ed only on eval date, could benefit from demo. Other Exercises: OT facilitates education re: safety with transfers including hand/foot placement as pt starts with both hands on FWW for sit to stand, OT advises about safety concerns and safer method of push to stand with FWW with pt demonstrating understanding. Other Exercises: OT facilitates education re:  general fall prevention/safety with pt verbalizing understanding.    General Comments        Pertinent Vitals/Pain Pain Assessment: Faces Pain Score: 6  Faces Pain Scale: Hurts a little bit Pain Location: R knee pain, pt states 3-4 when lying still, escalates to 6-7 with mobilization Pain Descriptors / Indicators: Aching;Sore Pain Intervention(s): Limited activity within patient's tolerance;Monitored during session;Premedicated before session;Repositioned    Home Living Family/patient expects to be discharged to:: Private residence Living Arrangements: Children;Other relatives(daughter/grand daughter) Available Help at Discharge: Family;Available 24 hours/day Type of Home: House Home Access: Ramped entrance   Home Layout: One level Home Equipment: Walker - 4 wheels;Shower seat;Grab bars - tub/shower;Adaptive equipment;Bedside commode;Hospital bed(hospital bed is for pt's wife.) Additional Comments: Pt reports difficulty with t/f in/out of tub, would intermittently just use wipes for bathing.    Prior Function Level of Independence: Independent with assistive device(s)      Comments: difficulty standing for cooking tasks, reports typically sitting on a stool during meal prep. States he could manage well for fxl mobility within the home with 4WW and only left for MD appts. Of note: he had just returned home from SNF after rehab for R TKA, was about to start outpt therapy when tendon rupture occurred.   PT Goals (current goals can now be found in the care plan section) Acute Rehab PT Goals Patient Stated Goal: Go to rehab and home PT Goal Formulation: With patient Time For Goal Achievement: 04/12/19 Potential to Achieve Goals: Good Progress towards PT goals: Progressing toward goals    Frequency    BID      PT Plan Current plan remains appropriate    Co-evaluation              AM-PAC PT "6 Clicks" Mobility   Outcome Measure  Help needed turning from your back to  your side while in a flat bed without using bedrails?: A Little Help needed moving from lying on your back to sitting on the side of a flat bed without using bedrails?: A Little Help needed moving to and from a bed to a chair (including a wheelchair)?: A Little Help needed standing up from a chair using your arms (e.g., wheelchair or bedside chair)?: A Little Help needed to walk in hospital room?: A Little Help needed climbing 3-5 steps with a railing? : A Lot 6 Click Score: 17    End of Session Equipment Utilized During Treatment: Gait belt(hinged locking knee brace) Activity Tolerance: Patient tolerated treatment well;No increased pain Patient left: in bed;with call bell/phone within reach;with family/visitor present;with SCD's reapplied;with bed alarm set Nurse Communication: Mobility status PT Visit Diagnosis: Muscle weakness (generalized) (M62.81);Difficulty in walking, not elsewhere classified (R26.2);Pain Pain - Right/Left: Right Pain - part of body: Knee     Time: 1228-1256 PT Time Calculation (min) (ACUTE ONLY): 28 min  Charges:  $Gait Training: 8-22 mins $Therapeutic Exercise: 8-22 mins               2:07 PM, 03/29/19 Etta Grandchild, PT, DPT Physical Therapist - [facility]  [phone] ([place])    Kaitlyn Franko C 03/29/2019, 2:04 PM

## 2019-03-29 NOTE — Progress Notes (Signed)
PT blood sugar 67. Given apple juice and tray is here will reassess blood sugar after lunch is consumed. Refused Metformin this shift and insulin not needed d/t hypoglycemic readings. Alert and responsive , will continue to monitor

## 2019-03-29 NOTE — Evaluation (Signed)
Occupational Therapy Evaluation Patient Details Name: Curtis Alexander MRN: [id number] DOB: 07/09/1946 Today's Date: 03/29/2019    History of Present Illness PT is 73 y/o M with PMH recent R TKA (03/05/2019), arthritis, cervical and lumbar spinal stenosis, DM, and HTN. Pt presented to ED following non-traumatic rupture of R patellar tendon (occurred while walking with 4WW per pt's report). S/p repair on 03/28/2019.   Clinical Impression   Pt seen for OT evaluation this date, POD#1 from above surgery. Pt was primarily modified independent in all ADLs prior to surgery, utilizing 4WW for fxl mobility within the home, only leaving for MD appts. Pt is eager to return to PLOF with less pain and improved safety and independence. Pt currently requires MIN/MOD A with sit<>stand from EOB with slightly elevated surface d/t some pain and limited AROM of R knee (still in immobilizer). Pt instructed in polar care mgt, falls prevention strategies, home/routines modifications, DME/AE for LB bathing and dressing tasks, and compression stocking mgt-all of which, he is relatively familiar with d/t recent R TKA (02/2019). Pt would benefit from skilled OT services including additional instruction in dressing techniques with or without assistive devices for dressing and bathing skills to support recall and carryover prior to discharge and ultimately to maximize safety, independence, and minimize falls risk and caregiver burden. SNF is likely safest discharge disposition, but pt expressing concerns about transportation d/t being unable to access daughter's elevated vehicle.      Follow Up Recommendations  SNF    Equipment Recommendations  None recommended by OT    Recommendations for Other Services       Precautions / Restrictions Precautions Precautions: Fall;Knee Precaution Comments: BP monitoring, RN reports BP was low in early AM requiring hold of pain meds until approx ~30 mins before OT evaluation. Required Braces  or Orthoses: Knee Immobilizer - Left(goniometric) Knee Immobilizer - Left: On at all times(order specifies that PT can mobilize 30 degrees) Restrictions Weight Bearing Restrictions: Yes RLE Weight Bearing: Weight bearing as tolerated      Mobility Bed Mobility Overal bed mobility: Needs Assistance Bed Mobility: Supine to Sit     Supine to sit: Min guard     General bed mobility comments: Increased time/effort to perform, use of bedrail and HOB elevated for sup to sit, OT assists with slight elevation of R heel off bed to reduce friction.  Transfers Overall transfer level: Needs assistance Equipment used: Rolling walker (2 wheeled)(bariatric FWW) Transfers: Sit to/from Stand Sit to Stand: Min assist;From elevated surface         General transfer comment: pt with flexed hips in static standing with FWW upon initial stand requiring MIN verbal cues to extend hips and posture into full stand    Balance Overall balance assessment: Needs assistance Sitting-balance support: No upper extremity supported;Feet supported Sitting balance-Leahy Scale: Good Sitting balance - Comments: steady sitting reaching within BOS   Standing balance support: Bilateral upper extremity supported Standing balance-Leahy Scale: Fair Standing balance comment: heavy wt bearing through UEs on FWW                           ADL either performed or assessed with clinical judgement   ADL Overall ADL's : Needs assistance/impaired Eating/Feeding: Independent   Grooming: Wash/dry hands;Wash/dry face;Oral care;Set up;Sitting Grooming Details (indicate cue type and reason): unable to perform in standing at time of eval as he reuquires both UE support in standing. Upper Body Bathing: Set up;Sitting  Lower Body Bathing: Moderate assistance;Maximal assistance;Sitting/lateral leans Lower Body Bathing Details (indicate cue type and reason): based on clinical observation Upper Body Dressing : Set  up;Sitting   Lower Body Dressing: Moderate assistance;Maximal assistance;Sitting/lateral leans;With adaptive equipment   Toilet Transfer: Minimal assistance;Moderate assistance;BSC;RW   Toileting- Clothing Manipulation and Hygiene: Moderate assistance;Maximal assistance;Sit to/from stand Toileting - Clothing Manipulation Details (indicate cue type and reason): standing with FWW, pt bearing weight through b/l UEs limiting ability to use functionally for peri care or clothing   Tub/Shower Transfer Details (indicate cue type and reason): Pt with very limited ability to weight shift, with very limited R foot clearance, based on clinical observation, likely MAX A.   General ADL Comments: Pt with low fxl activity tolerance at time of eval, somewhat limited by pain at time of eval.     Vision Patient Visual Report: No change from baseline       Perception     Praxis      Pertinent Vitals/Pain Pain Assessment: 0-10 Pain Score: 6  Pain Location: R knee pain, pt states 3-4 when lying still, escalates to 6-7 with mobilization Pain Descriptors / Indicators: Aching;Sore Pain Intervention(s): Limited activity within patient's tolerance;Monitored during session;Premedicated before session     Hand Dominance     Extremity/Trunk Assessment Upper Extremity Assessment Upper Extremity Assessment: Overall WFL for tasks assessed;RUE deficits/detail;LUE deficits/detail RUE Deficits / Details: shoulder, elbow, grip MMT all grossly >3/5 LUE Deficits / Details: shoulder, elbow, grip MMT all grossly >3/5   Lower Extremity Assessment Lower Extremity Assessment: Defer to PT evaluation;RLE deficits/detail;LLE deficits/detail RLE: Unable to fully assess due to pain;Unable to fully assess due to immobilization LLE Sensation: WNL LLE Coordination: WNL   Cervical / Trunk Assessment Cervical / Trunk Assessment: Normal   Communication Communication Communication: No difficulties   Cognition  Arousal/Alertness: Awake/alert Behavior During Therapy: WFL for tasks assessed/performed Overall Cognitive Status: Within Functional Limits for tasks assessed                                 General Comments: pleasant disposition   General Comments       Exercises Other Exercises Other Exercises: OT facilitates education re: potential to use AE for LB ADLs (pt already somewhat familiar due to recent rehab stay). Verbal ed only on eval date, could benefit from demo. Other Exercises: OT facilitates education re: safety with transfers including hand/foot placement as pt starts with both hands on FWW for sit to stand, OT advises about safety concerns and safer method of push to stand with FWW with pt demonstrating understanding. Other Exercises: OT facilitates education re: general fall prevention/safety with pt verbalizing understanding.   Shoulder Instructions      Home Living Family/patient expects to be discharged to:: Private residence Living Arrangements: Children;Other relatives(daughter/grand daughter) Available Help at Discharge: Family;Available 24 hours/day Type of Home: House Home Access: Ramped entrance     Home Layout: One level     Bathroom Shower/Tub: Teacher, early years/pre: Handicapped height     Home Equipment: Environmental consultant - 4 wheels;Shower seat;Grab bars - tub/shower;Adaptive equipment;Bedside commode;Hospital bed(hospital bed is for pt's wife.) Adaptive Equipment: Long-handled shoe horn;Long-handled sponge;Reacher Additional Comments: Pt reports difficulty with t/f in/out of tub, would intermittently just use wipes for bathing.      Prior Functioning/Environment Level of Independence: Independent with assistive device(s)        Comments: difficulty standing for cooking tasks,  reports typically sitting on a stool during meal prep. States he could manage well for fxl mobility within the home with 4WW and only left for MD appts. Of note: he  had just returned home from SNF after rehab for R TKA, was about to start outpt therapy when tendon rupture occurred.        OT Problem List: Decreased strength;Decreased range of motion;Pain;Cardiopulmonary status limiting activity;Decreased activity tolerance;Impaired balance (sitting and/or standing);Decreased knowledge of use of DME or AE      OT Treatment/Interventions: Self-care/ADL training;Therapeutic exercise;Therapeutic activities;DME and/or AE instruction;Patient/family education;Balance training    OT Goals(Current goals can be found in the care plan section) Acute Rehab OT Goals Patient Stated Goal: Go to rehab and home OT Goal Formulation: With patient Time For Goal Achievement: 04/12/19 Potential to Achieve Goals: Good  OT Frequency: Min 1X/week   Barriers to D/C:            Co-evaluation              AM-PAC OT "6 Clicks" Daily Activity     Outcome Measure Help from another person eating meals?: None Help from another person taking care of personal grooming?: None Help from another person toileting, which includes using toliet, bedpan, or urinal?: A Lot Help from another person bathing (including washing, rinsing, drying)?: A Lot Help from another person to put on and taking off regular upper body clothing?: None Help from another person to put on and taking off regular lower body clothing?: A Lot 6 Click Score: 18   End of Session Equipment Utilized During Treatment: Gait belt;Rolling walker;Right knee immobilizer Nurse Communication: Mobility status;Other (comment)(BPs taken by OT in sup and sit)  Activity Tolerance: Patient tolerated treatment well Patient left: (sitting EOB after stand with OT, PT presents to room for evaluation, RN present as well, OT does not re-set bed alarm at this time for this reason, but does provide PT with chair alarm for their session.)  OT Visit Diagnosis: Other abnormalities of gait and mobility (R26.89);Muscle weakness  (generalized) (M62.81);Unsteadiness on feet (R26.81) Pain - Right/Left: Right Pain - part of body: Knee                Time: [id number] OT Time Calculation (min): 43 min Charges:  OT Evaluation $OT Eval Moderate Complexity: 1 Mod OT Treatments $Self Care/Home Management : 8-22 mins $Therapeutic Activity: 8-22 mins  Gerrianne Scale, MS, OTR/L ascom [phone] or [phone] 03/29/19, 11:41 AM

## 2019-03-29 NOTE — Anesthesia Postprocedure Evaluation (Signed)
Anesthesia Post Note  Patient: Curtis Alexander  Procedure(s) Performed: PATELLA TENDON REPAIR (Right Knee)  Patient location during evaluation: PACU Anesthesia Type: Spinal Level of consciousness: oriented and awake and alert Pain management: pain level controlled Vital Signs Assessment: post-procedure vital signs reviewed and stable Respiratory status: spontaneous breathing, respiratory function stable and patient connected to nasal cannula oxygen Cardiovascular status: blood pressure returned to baseline and stable Postop Assessment: no headache, no backache and no apparent nausea or vomiting Anesthetic complications: no     Last Vitals:  Vitals:   03/29/19 0557 03/29/19 0813  BP: (!) 98/58 (!) 141/61  Pulse: (!) 56 (!) 57  Resp: 19 20  Temp:  (!) 36.3 C  SpO2: 100% 100%    Last Pain:  Vitals:   03/29/19 0813  TempSrc: Oral  PainSc:                  Jacquese Cassarino S

## 2019-03-29 NOTE — Progress Notes (Signed)
  Subjective: 1 Day Post-Op Procedure(s) (LRB): PATELLA TENDON REPAIR (Right) Patient reports pain as moderate.   Patient seen in rounds with Dr. Roland Rack. Patient is well, and has had no acute complaints or problems.  He was able to get his pain back under control last night. Plan is to go Home versus rehab after hospital stay. Negative for chest pain and shortness of breath Fever: no Gastrointestinal: Negative for nausea and vomiting  Objective: Vital signs in last 24 hours: Temp:  [97.5 F (36.4 C)-98.8 F (37.1 C)] 97.9 F (36.6 C) (09/05 0350) Pulse Rate:  [48-74] 56 (09/05 0557) Resp:  [11-19] 19 (09/05 0557) BP: (90-138)/(39-84) 98/58 (09/05 0557) SpO2:  [97 %-100 %] 100 % (09/05 0557) Weight:  [121 kg] 121 kg (09/04 1156)  Intake/Output from previous day:  Intake/Output Summary (Last 24 hours) at 03/29/2019 0730 Last data filed at 03/29/2019 0723 Gross per 24 hour  Intake 3540 ml  Output 4200 ml  Net -660 ml    Intake/Output this shift: Total I/O In: -  Out: 250 [Urine:250]  Labs: Recent Labs    03/27/19 2355  HGB 11.6*   Recent Labs    03/27/19 2355  WBC 5.2  RBC 3.87*  HCT 37.6*  PLT 185   Recent Labs    03/27/19 2355  NA 141  K 4.2  CL 110  CO2 25  BUN 21  CREATININE 0.71  GLUCOSE 94  CALCIUM 8.3*   Recent Labs    03/27/19 2355  INR 1.2     EXAM General - Patient is Alert and Oriented Extremity - Neurovascular intact Sensation intact distally Compartment soft Dressing/Incision - clean, dry, no drainage Motor Function - intact, moving foot and toes well on exam.   Past Medical History:  Diagnosis Date  . Arthritis   . Cervical spinal stenosis   . Diabetes mellitus without complication ([place])   . Essential tremor   . GERD (gastroesophageal reflux disease)   . History of hiatal hernia    fixed with gastric bypass surgery  . History of kidney stones   . Hypertension   . Hypoglycemia   . Non-traumatic rupture of right patellar  tendon 03/25/2019  . Sleep apnea    had gastric bypass and no longer uses cpap machine  . Spinal stenosis, lumbar     Assessment/Plan: 1 Day Post-Op Procedure(s) (LRB): PATELLA TENDON REPAIR (Right) Active Problems:   Non-traumatic rupture of right patellar tendon  Estimated body mass index is 40.56 kg/m as calculated from the following:   Height as of this encounter: 5\' 8"  (1.727 m).   Weight as of this encounter: 121 kg. Advance diet Up with therapy D/C IV fluids  Care management to assist with discharge home versus rehab. Bowel management. Hinged knee brace locked in extension on at all times.  Gentle passive range of motion with physical therapy to 30 degrees  DVT Prophylaxis - Lovenox, Foot Pumps and TED hose Weight-Bearing as tolerated to right leg  [name], PA-C Orthopaedic Surgery 03/29/2019, 7:30 AM

## 2019-03-29 NOTE — Evaluation (Signed)
Physical Therapy Evaluation Patient Details Name: Curtis Alexander MRN: [id number] DOB: [birth year] Today's Date: 03/29/2019   History of Present Illness  Curtis Alexander is a 47yoM who comes to [facility] after a fall, wherein sustaining a Rt patellar ligament rupture, TKA same side ~2weeks prior. PMH bilat knee DJD, LSS, DM, HT.  Clinical Impression  Pt admitted with above diagnosis. Pt currently with functional limitations due to the deficits listed below (see "PT Problem List"). Upon entry, pt in bed, awake and agreeable to participate, pt finishing up with OT evaluation. The pt is alert and oriented x4, pleasant, conversational, and generally a good historian. Knee brace adjusted as not donned in alignment with knee and not tight enough to provide adequate support. After adjustments, pain better managed in weight bearing. Pt requires [place] for transfers x3, is agreeable to going to recliner after toiletting only after [place] Curia assures to return immediately after lunch, as patient is anxious about being "left in that chair all day long." After extensive time seated/standing. Pt's foot noted to be quite dark purple, initially thought to be cyanotic, but with blatant blanching, noted- appears improved once limb is elevated x5 minutes at end of session. Pt declines AMB until later, as he is exhausted from a busy morning. Functional mobility assessment demonstrates increased effort/time requirements, poor tolerance, and need for physical assistance, whereas the patient performed these at a higher level of independence PTA.Pt will benefit from skilled PT intervention to increase independence and safety with basic mobility in preparation for discharge to the venue listed below.         Follow Up Recommendations SNF    Equipment Recommendations  None recommended by PT    Recommendations for Other Services       Precautions / Restrictions Precautions Precautions: Fall;Knee Precaution Comments: BP  monitoring, RN reports BP was low in early AM requiring hold of pain meds until approx ~30 mins before OT evaluation. Required Braces or Orthoses: Other Brace Knee Immobilizer - Left: On at all times(order specifies that PT can mobilize 30 degrees) Other Brace: hinged locking knee brace; locked when OOB, 0-30degrees permitted in bed. Restrictions Weight Bearing Restrictions: Yes RLE Weight Bearing: Weight bearing as tolerated      Mobility  Bed Mobility Overal bed mobility: Needs Assistance Bed Mobility: Supine to Sit     Supine to sit: Min guard     General bed mobility comments: at EOB upon arrival  Transfers Overall transfer level: Needs assistance Equipment used: Rolling walker (2 wheeled) Transfers: Sit to/from Stand Sit to Stand: Mod assist Stand pivot transfers: Mod assist       General transfer comment: takes 15-20sec to full establish standing. Pt assisted with pericare while he maintains balance standing.  Ambulation/Gait                Stairs            Wheelchair Mobility    Modified Rankin (Stroke Patients Only)       Balance Overall balance assessment: Modified Independent;Mild deficits observed, not formally tested Sitting-balance support: No upper extremity supported;Feet supported Sitting balance-Leahy Scale: Good Sitting balance - Comments: steady sitting reaching within BOS   Standing balance support: Bilateral upper extremity supported Standing balance-Leahy Scale: Fair Standing balance comment: heavy wt bearing through UEs on FWW                             Pertinent Vitals/Pain Pain Assessment: Faces  Pain Score: 6  Faces Pain Scale: Hurts a little bit Pain Location: R knee pain, pt states 3-4 when lying still, escalates to 6-7 with mobilization Pain Descriptors / Indicators: Aching;Sore Pain Intervention(s): Limited activity within patient's tolerance;Monitored during session;Premedicated before  session;Repositioned    Home Living Family/patient expects to be discharged to:: Private residence Living Arrangements: Children;Other relatives(daughter/grand daughter) Available Help at Discharge: Family;Available 24 hours/day Type of Home: House Home Access: Ramped entrance     Home Layout: One level Home Equipment: Walker - 4 wheels;Shower seat;Grab bars - tub/shower;Adaptive equipment;Bedside commode;Hospital bed(hospital bed is for pt's wife.) Additional Comments: Pt reports difficulty with t/f in/out of tub, would intermittently just use wipes for bathing.    Prior Function Level of Independence: Independent with assistive device(s)         Comments: difficulty standing for cooking tasks, reports typically sitting on a stool during meal prep. States he could manage well for fxl mobility within the home with 4WW and only left for MD appts. Of note: he had just returned home from SNF after rehab for R TKA, was about to start outpt therapy when tendon rupture occurred.     Hand Dominance        Extremity/Trunk Assessment   Upper Extremity Assessment Upper Extremity Assessment: Overall WFL for tasks assessed;RUE deficits/detail;LUE deficits/detail RUE Deficits / Details: shoulder, elbow, grip MMT all grossly >3/5 LUE Deficits / Details: shoulder, elbow, grip MMT all grossly >3/5    Lower Extremity Assessment Lower Extremity Assessment: Defer to PT evaluation;RLE deficits/detail;LLE deficits/detail RLE: Unable to fully assess due to pain;Unable to fully assess due to immobilization LLE Sensation: WNL LLE Coordination: WNL    Cervical / Trunk Assessment Cervical / Trunk Assessment: Normal  Communication   Communication: No difficulties  Cognition Arousal/Alertness: Awake/alert Behavior During Therapy: WFL for tasks assessed/performed Overall Cognitive Status: Within Functional Limits for tasks assessed                                 General Comments:  pleasant disposition      General Comments      Exercises Other Exercises Other Exercises: OT facilitates education re: potential to use AE for LB ADLs (pt already somewhat familiar due to recent rehab stay). Verbal ed only on eval date, could benefit from demo. Other Exercises: OT facilitates education re: safety with transfers including hand/foot placement as pt starts with both hands on FWW for sit to stand, OT advises about safety concerns and safer method of push to stand with FWW with pt demonstrating understanding. Other Exercises: OT facilitates education re: general fall prevention/safety with pt verbalizing understanding.   Assessment/Plan    PT Assessment Patient needs continued PT services  PT Problem List Decreased strength;Decreased range of motion;Decreased activity tolerance;Decreased balance;Decreased mobility;Decreased coordination;Decreased cognition;Decreased knowledge of use of DME;Decreased safety awareness;Decreased knowledge of precautions;Pain       PT Treatment Interventions DME instruction;Gait training;Functional mobility training;Therapeutic activities;Therapeutic exercise;Balance training;Neuromuscular re-education;Patient/family education    PT Goals (Current goals can be found in the Care Plan section)  Acute Rehab PT Goals Patient Stated Goal: Go to rehab and home PT Goal Formulation: With patient Time For Goal Achievement: 04/12/19 Potential to Achieve Goals: Good    Frequency BID   Barriers to discharge        Co-evaluation               AM-PAC PT "6 Clicks" Mobility  Outcome Measure Help needed turning from your back to your side while in a flat bed without using bedrails?: A Little   Help needed moving to and from a bed to a chair (including a wheelchair)?: A Little Help needed standing up from a chair using your arms (e.g., wheelchair or bedside chair)?: A Little Help needed to walk in hospital room?: A Little Help needed climbing  3-5 steps with a railing? : A Lot 6 Click Score: 14    End of Session Equipment Utilized During Treatment: Gait belt Activity Tolerance: Patient tolerated treatment well;No increased pain Patient left: in chair;with call bell/phone within reach;with chair alarm set Nurse Communication: Mobility status Pain - Right/Left: Right Pain - part of body: Knee    Time: 1015-1110 PT Time Calculation (min) (ACUTE ONLY): 55 min   Charges:   PT Evaluation $PT Eval Moderate Complexity: 1 Mod PT Treatments $Therapeutic Exercise: 23-37 mins        1:58 PM, 03/29/19 Etta Grandchild, PT, DPT Physical Therapist - [facility]  [phone] ([place])    Ayjah Show C 03/29/2019, 1:49 PM

## 2019-03-29 NOTE — Progress Notes (Signed)
Pt CBG reassessed and is now 96. Stable condition at this time. Denies pain or discomfort.

## 2019-03-30 ENCOUNTER — Encounter: Payer: Self-pay | Admitting: Orthopedic Surgery

## 2019-03-30 LAB — GLUCOSE, CAPILLARY
Glucose-Capillary: 83 mg/dL (ref 70–99)
Glucose-Capillary: 87 mg/dL (ref 70–99)
Glucose-Capillary: 79 mg/dL (ref 70–99)
Glucose-Capillary: 108 mg/dL — ABNORMAL HIGH (ref 70–99)

## 2019-03-30 NOTE — Plan of Care (Signed)

## 2019-03-30 NOTE — TOC Initial Note (Signed)
Transition of Care [facility]) - Initial/Assessment Note    Patient Details  Name: Curtis Alexander MRN: [id number] Date of Birth: [birth year]  Transition of Care [facility]) CM/SW Contact:    Latanya Maudlin, RN Phone Number: 03/30/2019, 9:50 AM  Clinical Narrative:   TOC consulted to assist with disposition. Patient currently lives in [place], [place] with his daughter. Patient has recently been to [facility] in [place] and would like to use them again. His second option is [facility] in [place]. PT is rec SNF. Will send out offers to those facilities. Patient has rolling walker and bedside commode at home.                 Expected Discharge Plan: Skilled Nursing Facility Barriers to Discharge: Continued Medical Work up   Patient Goals and CMS Choice   CMS Medicare.gov Compare Post Acute Care list provided to:: Patient Choice offered to / list presented to : Patient  Expected Discharge Plan and Services Expected Discharge Plan: [place] Acute Care Choice: [place], [place] Living arrangements for the past 2 months: Single Family Home                            Prior Living Arrangements/Services Living arrangements for the past 2 months: Single Family Home Lives with:: Adult Children Patient language and need for interpreter reviewed:: Yes Do you feel safe going back to the place where you live?: Yes      Need for Family Participation in Patient Care: No (Comment)   Current home services: DME Criminal Activity/Legal Involvement Pertinent to Current Situation/Hospitalization: No - Comment as needed  Activities of Daily Living Home Assistive Devices/Equipment: Eyeglasses, Environmental consultant (specify type) ADL Screening (condition at time of admission) Patient's cognitive ability adequate to safely complete daily activities?: Yes Is the patient deaf or have difficulty hearing?: No Does the patient have difficulty seeing, even when wearing  glasses/contacts?: No Does the patient have difficulty concentrating, remembering, or making decisions?: No Patient able to express need for assistance with ADLs?: Yes Does the patient have difficulty dressing or bathing?: No Independently performs ADLs?: Yes (appropriate for developmental age) Does the patient have difficulty walking or climbing stairs?: Yes Weakness of Legs: Right Weakness of Arms/Hands: None  Permission Sought/Granted Permission sought to share information with : Case Manager                Emotional Assessment Appearance:: Appears stated age Attitude/Demeanor/Rapport: Ambitious, Engaged, Gracious Affect (typically observed): Pleasant Orientation: : Oriented to Self, Oriented to Place, Oriented to  Time, Oriented to Situation      Admission diagnosis:  patellar tendon rupture Patient Active Problem List   Diagnosis Date Noted  . Non-traumatic rupture of right patellar tendon 03/25/2019  . 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 Essential tremor 03/04/2019  . Gout 03/04/2019  . Intestinal malabsorption 09/29/2018  . 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 Lumbar herniated disc 12/17/2012  . Nephrolithiasis 06/11/2012  . Anxiety disorder 03/12/2012  . Status post abdominoplasty 04/30/2007  . Absence of bladder continence 11/04/2003  . Back pain 11/04/2003  . Depression 11/04/2003  . Diabetes mellitus, type II ([place]) 11/04/2003  . Hypertension 11/04/2003  . Insomnia 11/04/2003  . Morbid obesity ([place]) 11/04/2003  . Orthopnea 11/04/2003  . Peptic ulcer disease 11/04/2003  . Sleep apnea  11/04/2003  . Venous insufficiency 11/04/2003   PCP:  [name] Pharmacy:   [place], [place] [address] Phone: [phone] Fax: [phone]     Social Determinants of Health  (SDOH) Interventions    Readmission Risk Interventions Readmission Risk Prevention Plan 03/30/2019  Transportation Screening Complete  PCP or Specialist Appt within 5-7 Days Complete  Home Care Screening Complete  Medication Review (RN CM) Complete

## 2019-03-30 NOTE — Progress Notes (Signed)
Physical Therapy Treatment Patient Details Name: Curtis Alexander MRN: [id number] DOB: 05-Jun-1946 Today's Date: 03/30/2019    History of Present Illness Curtis Alexander is a 10yoM who comes to [facility] after a fall, wherein sustaining a Rt patellar ligament rupture, TKA same side ~2weeks prior. PMH bilat knee DJD, LSS, DM, HT.    PT Comments    Pt presents with deficits in strength, transfers, mobility, gait, balance, and activity tolerance.  Pt was Mod Ind with sup to sit with extra time and effort required but no physical assistance needed.  Pt was CGA with transfers from an elevated EOB with cues for proper sequencing.  Pt was able to amb 1 x 5' and 1 x 20' with a RW and close CGA with effortful steps and heavy lean on the RW but without LOB.  Pt tolerated gentle R knee flex PROM 0-30 deg well without pain.  Pt reported some R knee pain with initial amb x 5' with pt returned to supine and knee brace adjusted/tightened with pt reported no pain during subsequent amb x 20'.  Pt will benefit from PT services in a SNF setting upon discharge to safely address above deficits for decreased caregiver assistance and eventual return to PLOF.     Follow Up Recommendations  SNF     Equipment Recommendations  None recommended by PT    Recommendations for Other Services       Precautions / Restrictions Precautions Precautions: Fall;Knee Precaution Comments: BP monitoring, RN reports BP was low in early AM requiring med hold. Required Braces or Orthoses: Other Brace Knee Immobilizer - Left: On at all times Other Brace: hinged locking knee brace locked in ext except with 0-30 PROM with PT.  OK to do QS. Restrictions Weight Bearing Restrictions: Yes RLE Weight Bearing: Weight bearing as tolerated    Mobility  Bed Mobility Overal bed mobility: Modified Independent       Supine to/from sit: Modified independent (Device/Increase time)     General bed mobility comments: Extra time and effort but  no physical assistance required  Transfers Overall transfer level: Needs assistance Equipment used: Rolling walker (2 wheeled) Transfers: Sit to/from Stand Sit to Stand: Min assist;From elevated surface         General transfer comment: Mod verbal cues for sequencing with R knee brace donned and locked in full ext  Ambulation/Gait Ambulation/Gait assistance: Min guard Gait Distance (Feet): 20 Feet Assistive device: Rolling walker (2 wheeled) Gait Pattern/deviations: Step-to pattern;Trunk flexed;Wide base of support Gait velocity: decreased   General Gait Details: Heavy lean on the RW with trunk flexed but steady without LOB   Stairs             Wheelchair Mobility    Modified Rankin (Stroke Patients Only)       Balance Overall balance assessment: Needs assistance Sitting-balance support: No upper extremity supported;Feet supported Sitting balance-Leahy Scale: Good     Standing balance support: Bilateral upper extremity supported;During functional activity Standing balance-Leahy Scale: Fair Standing balance comment: heavy wt bearing through UEs on FWW                            Cognition Arousal/Alertness: Awake/alert Behavior During Therapy: WFL for tasks assessed/performed Overall Cognitive Status: Within Functional Limits for tasks assessed  Exercises Total Joint Exercises Ankle Circles/Pumps: Strengthening;Both;10 reps Quad Sets: Strengthening;Both;10 reps Gluteal Sets: Strengthening;Both;10 reps Hip ABduction/ADduction: AROM;Right;10 reps Long Arc Quad: AROM;Strengthening;15 reps;Left;10 reps Knee Flexion: AROM;Strengthening;15 reps;Left;10 reps Other Exercises Other Exercises: Gentle R knee flex PROM 0-30 deg    General Comments        Pertinent Vitals/Pain Pain Assessment: No/denies pain    Home Living                      Prior Function            PT Goals  (current goals can now be found in the care plan section) Progress towards PT goals: Progressing toward goals    Frequency    BID      PT Plan Current plan remains appropriate    Co-evaluation              AM-PAC PT "6 Clicks" Mobility   Outcome Measure  Help needed turning from your back to your side while in a flat bed without using bedrails?: A Little Help needed moving from lying on your back to sitting on the side of a flat bed without using bedrails?: A Little Help needed moving to and from a bed to a chair (including a wheelchair)?: A Little Help needed standing up from a chair using your arms (e.g., wheelchair or bedside chair)?: A Little Help needed to walk in hospital room?: A Little Help needed climbing 3-5 steps with a railing? : A Lot 6 Click Score: 17    End of Session Equipment Utilized During Treatment: Gait belt Activity Tolerance: Patient tolerated treatment well Patient left: in chair;with call bell/phone within reach;with chair alarm set;with SCD's reapplied;Other (comment)(Polar care to R knee) Nurse Communication: Mobility status PT Visit Diagnosis: Muscle weakness (generalized) (M62.81);Difficulty in walking, not elsewhere classified (R26.2);Pain Pain - Right/Left: Right Pain - part of body: Knee     Time: [id number] PT Time Calculation (min) (ACUTE ONLY): 40 min  Charges:  $Gait Training: 8-22 mins $Therapeutic Exercise: 8-22 mins $Therapeutic Activity: 8-22 mins                     D. Scott Hendrik Donath PT, DPT 03/30/19, 12:09 PM

## 2019-03-30 NOTE — NC FL2 (Signed)
[place] LEVEL OF CARE SCREENING TOOL     IDENTIFICATION  Patient Name: Curtis Alexander Birthdate: 05/05/1946 Sex: male Admission Date (Current Location): 03/25/2019  [place] and [place] Number:  Engineering geologist and Address:  [facility], [address], [place], [place] [address]      Provider Number: [id number]  Attending Physician Name and Address:  Dereck Leep, MD  Relative Name and Phone Number:  [place], Kasparek Daughter   [phone]    Current Level of Care: Hospital Recommended Level of Care: [place] Prior Approval Number:    Date Approved/Denied:   PASRR Number: [id number]  Discharge Plan: SNF    Current Diagnoses: Patient Active Problem List   Diagnosis Date Noted  . Non-traumatic rupture of right patellar tendon 03/25/2019  . 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 Essential tremor 03/04/2019  . Gout 03/04/2019  . Intestinal malabsorption 09/29/2018  . 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 Lumbar herniated disc 12/17/2012  . Nephrolithiasis 06/11/2012  . Anxiety disorder 03/12/2012  . Status post abdominoplasty 04/30/2007  . Absence of bladder continence 11/04/2003  . Back pain 11/04/2003  . Depression 11/04/2003  . Diabetes mellitus, type II ([place]) 11/04/2003  . Hypertension 11/04/2003  . Insomnia 11/04/2003  . Morbid obesity ([place]) 11/04/2003  . Orthopnea 11/04/2003  . Peptic ulcer disease 11/04/2003  . Sleep apnea 11/04/2003  . Venous insufficiency 11/04/2003    Orientation RESPIRATION BLADDER Height & Weight     Self, Time, Situation, Place  Normal Continent Weight: 121 kg Height:  5\' 8"  (172.7 cm)  BEHAVIORAL SYMPTOMS/MOOD NEUROLOGICAL BOWEL NUTRITION STATUS      Continent    AMBULATORY STATUS COMMUNICATION OF NEEDS Skin   Limited Assist Verbally Skin abrasions,  Surgical wounds                       Personal Care Assistance Level of Assistance  Bathing, Feeding, Dressing, Total care Bathing Assistance: Limited assistance Feeding assistance: Independent Dressing Assistance: Limited assistance Total Care Assistance: Limited assistance   Functional Limitations Info    Sight Info: Adequate Hearing Info: Adequate Speech Info: Adequate    SPECIAL CARE FACTORS FREQUENCY  PT (By licensed PT), OT (By licensed OT)     PT Frequency: 5x per week OT Frequency: 5x per week            Contractures Contractures Info: Not present    Additional Factors Info  Code Status, Allergies Code Status Info: Full code Allergies Info: none           Current Medications (03/30/2019):  This is the current hospital active medication list Current Facility-Administered Medications  Medication Dose Route Frequency Provider Last Rate Last Dose  . 0.9 %  sodium chloride infusion   Intravenous Continuous Hooten, Laurice Record, MD 5 mL/hr at 03/30/19 0600    . acetaminophen (TYLENOL) tablet 325-650 mg  325-650 mg Oral Q6H PRN Hooten, Laurice Record, MD      . allopurinol (ZYLOPRIM) tablet 300 mg  300 mg Oral Q lunch Hooten, Laurice Record, MD   300 mg at 03/29/19 1255  . alum & mag hydroxide-simeth (MAALOX/MYLANTA) 200-200-20 MG/5ML suspension 30 mL  30 mL Oral Q4H PRN Hooten, Laurice Record, MD      . bisacodyl (DULCOLAX) suppository 10 mg  10 mg Rectal Daily PRN Hooten, Laurice Record, MD      .  brimonidine (ALPHAGAN) 0.2 % ophthalmic solution 1 drop  1 drop Right Eye BID Hooten, Laurice Record, MD   1 drop at 03/29/19 2202  . celecoxib (CELEBREX) capsule 200 mg  200 mg Oral BID Dereck Leep, MD   200 mg at 03/29/19 2203  . cholecalciferol (VITAMIN D3) tablet 2,000 Units  2,000 Units Oral Daily Hooten, Laurice Record, MD   2,000 Units at 03/29/19 1011  . clonazePAM (KLONOPIN) disintegrating tablet 0.25 mg  0.25 mg Oral BID Dereck Leep, MD   0.25 mg at 03/29/19 1013  . cyclobenzaprine (FLEXERIL)  tablet 10 mg  10 mg Oral TID Dereck Leep, MD   10 mg at 03/29/19 2205  . diphenhydrAMINE (BENADRYL) 12.5 MG/5ML elixir 12.5-25 mg  12.5-25 mg Oral Q4H PRN Hooten, Laurice Record, MD      . enoxaparin (LOVENOX) injection 30 mg  30 mg Subcutaneous Q12H Hooten, Laurice Record, MD   30 mg at 03/30/19 0911  . erythromycin ophthalmic ointment 1 application  1 application Left Eye QHS PRN Hooten, Laurice Record, MD      . ferrous sulfate tablet 325 mg  325 mg Oral BID WC Hooten, Laurice Record, MD   325 mg at 03/27/19 1525  . finasteride (PROSCAR) tablet 5 mg  5 mg Oral Q lunch Hooten, Laurice Record, MD   5 mg at 03/29/19 1230  . gabapentin (NEURONTIN) capsule 300 mg  300 mg Oral QHS Hooten, Laurice Record, MD   300 mg at 03/29/19 2207  . HYDROmorphone (DILAUDID) injection 0.5-1 mg  0.5-1 mg Intravenous Q4H PRN Dereck Leep, MD   0.5 mg at 03/28/19 1950  . insulin aspart (novoLOG) injection 0-15 Units  0-15 Units Subcutaneous TID WC Hooten, Laurice Record, MD      . latanoprost (XALATAN) 0.005 % ophthalmic solution 1 drop  1 drop Right Eye QHS Hooten, Laurice Record, MD   1 drop at 03/29/19 2202  . loratadine (CLARITIN) tablet 10 mg  10 mg Oral Daily PRN Hooten, Laurice Record, MD      . magnesium hydroxide (MILK OF MAGNESIA) suspension 30 mL  30 mL Oral Daily Hooten, Laurice Record, MD   30 mL at 03/29/19 1015  . menthol-cetylpyridinium (CEPACOL) lozenge 3 mg  1 lozenge Oral PRN Hooten, Laurice Record, MD       Or  . phenol (CHLORASEPTIC) mouth spray 1 spray  1 spray Mouth/Throat PRN Hooten, Laurice Record, MD      . metFORMIN (GLUCOPHAGE) tablet 250 mg  250 mg Oral BID WC Hooten, Laurice Record, MD   250 mg at 03/26/19 1024  . metoCLOPramide (REGLAN) tablet 5-10 mg  5-10 mg Oral Q8H PRN Hooten, Laurice Record, MD       Or  . metoCLOPramide (REGLAN) injection 5-10 mg  5-10 mg Intravenous Q8H PRN Hooten, Laurice Record, MD      . metoCLOPramide (REGLAN) tablet 10 mg  10 mg Oral TID AC & HS Hooten, Laurice Record, MD   10 mg at 03/30/19 0912  . mirtazapine (REMERON) tablet 45 mg  45 mg Oral QHS Hooten,  Laurice Record, MD   45 mg at 03/29/19 2206  . ondansetron (ZOFRAN) tablet 4 mg  4 mg Oral Q6H PRN Hooten, Laurice Record, MD       Or  . ondansetron (ZOFRAN) injection 4 mg  4 mg Intravenous Q6H PRN Hooten, Laurice Record, MD      . oxyCODONE (Oxy IR/ROXICODONE) immediate release tablet 10 mg  10 mg  Oral Q4H PRN Dereck Leep, MD   10 mg at 03/29/19 1826  . oxyCODONE (Oxy IR/ROXICODONE) immediate release tablet 5 mg  5 mg Oral Q4H PRN Hooten, Laurice Record, MD   5 mg at 03/30/19 0910  . pantoprazole (PROTONIX) EC tablet 40 mg  40 mg Oral BID Dereck Leep, MD   40 mg at 03/29/19 2204  . primidone (MYSOLINE) tablet 50 mg  50 mg Oral BID Dereck Leep, MD   50 mg at 03/29/19 2205  . propranolol (INDERAL) tablet 40 mg  40 mg Oral BID Dereck Leep, MD   40 mg at 03/29/19 2203  . senna (SENOKOT) tablet 8.6 mg  1 tablet Oral BID Dereck Leep, MD   8.6 mg at 03/29/19 2204  . senna-docusate (Senokot-S) tablet 1 tablet  1 tablet Oral BID Dereck Leep, MD   1 tablet at 03/29/19 2205  . simvastatin (ZOCOR) tablet 20 mg  20 mg Oral QPM Hooten, Laurice Record, MD   20 mg at 03/29/19 1750  . sodium phosphate (FLEET) 7-19 GM/118ML enema 1 enema  1 enema Rectal Once PRN Hooten, Laurice Record, MD      . tamsulosin (FLOMAX) capsule 0.4 mg  0.4 mg Oral QHS Hooten, Laurice Record, MD   0.4 mg at 03/29/19 2206  . topiramate (TOPAMAX) tablet 100 mg  100 mg Oral Q breakfast Hooten, Laurice Record, MD   100 mg at 03/30/19 0915  . topiramate (TOPAMAX) tablet 50 mg  50 mg Oral Q lunch Hooten, Laurice Record, MD   50 mg at 03/29/19 1232  . traMADol (ULTRAM) tablet 50-100 mg  50-100 mg Oral Q4H PRN Dereck Leep, MD   100 mg at 03/29/19 0041  . vitamin B-12 (CYANOCOBALAMIN) tablet 2,500 mcg  2,500 mcg Oral Daily Dereck Leep, MD   2,500 mcg at 03/27/19 0846  . vitamin C (ASCORBIC ACID) tablet 1,000 mg  1,000 mg Oral Daily Hooten, Laurice Record, MD   1,000 mg at 03/27/19 0848  . zolpidem (AMBIEN) tablet 5 mg  5 mg Oral QHS Hooten, Laurice Record, MD   5 mg at 03/29/19 2204      Discharge Medications: Please see discharge summary for a list of discharge medications.  Relevant Imaging Results:  Relevant Lab Results:   Additional Information ss- [id number]  Latanya Maudlin, RN

## 2019-03-30 NOTE — Progress Notes (Signed)
  Subjective: 2 Days Post-Op Procedure(s) (LRB): PATELLA TENDON REPAIR (Right) Patient reports pain as mild to moderate.   Patient seen in rounds with Dr. Roland Rack. Patient is well, and has had no acute complaints or problems.   Plan is to go Home versus rehab after hospital stay. Negative for chest pain and shortness of breath Fever: no Gastrointestinal: Negative for nausea and vomiting  Objective: Vital signs in last 24 hours: Temp:  [97.4 F (36.3 C)-98.6 F (37 C)] 98.6 F (37 C) (09/05 2333) Pulse Rate:  [57-87] 59 (09/05 2333) Resp:  [16-21] 16 (09/05 2333) BP: (96-141)/(51-61) 96/55 (09/05 2333) SpO2:  [100 %] 100 % (09/05 2333)  Intake/Output from previous day:  Intake/Output Summary (Last 24 hours) at 03/30/2019 0754 Last data filed at 03/30/2019 0713 Gross per 24 hour  Intake 3358.11 ml  Output 2916 ml  Net 442.11 ml    Intake/Output this shift: Total I/O In: -  Out: 600 [Urine:600]  Labs: Recent Labs    03/27/19 2355  HGB 11.6*   Recent Labs    03/27/19 2355  WBC 5.2  RBC 3.87*  HCT 37.6*  PLT 185   Recent Labs    03/27/19 2355  NA 141  K 4.2  CL 110  CO2 25  BUN 21  CREATININE 0.71  GLUCOSE 94  CALCIUM 8.3*   Recent Labs    03/27/19 2355  INR 1.2     EXAM General - Patient is Alert and Oriented Extremity - Neurovascular intact Sensation intact distally Compartment soft Dressing/Incision - clean, dry, no drainage.  The surgical bandage was removed with honeycomb still intact.  There is scant drainage. Motor Function - intact, moving foot and toes well on exam.  Able to do a straight leg raise independently  Past Medical History:  Diagnosis Date  . Arthritis   . Cervical spinal stenosis   . Diabetes mellitus without complication ([place])   . Essential tremor   . GERD (gastroesophageal reflux disease)   . History of hiatal hernia    fixed with gastric bypass surgery  . History of kidney stones   . Hypertension   . Hypoglycemia    . Non-traumatic rupture of right patellar tendon 03/25/2019  . Sleep apnea    had gastric bypass and no longer uses cpap machine  . Spinal stenosis, lumbar     Assessment/Plan: 2 Days Post-Op Procedure(s) (LRB): PATELLA TENDON REPAIR (Right) Active Problems:   Non-traumatic rupture of right patellar tendon  Estimated body mass index is 40.56 kg/m as calculated from the following:   Height as of this encounter: 5\' 8"  (1.727 m).   Weight as of this encounter: 121 kg. Advance diet Up with therapy D/C IV fluids  Care management to assist with discharge home versus rehab. Bowel management. Hinged knee brace locked in extension on at all times.  Gentle passive range of motion with physical therapy to 30 degrees  DVT Prophylaxis - Lovenox, Foot Pumps and TED hose Weight-Bearing as tolerated to right leg  Curtis Dixon, PA-C Orthopaedic Surgery 03/30/2019, 7:54 AM

## 2019-03-31 NOTE — Progress Notes (Signed)
Pt refuse fingersticks , Metformin, Ferrous sulfate, insulin, propanolol, Vit C , and Vitamin B12 . Denies pain or discomfort at this time.

## 2019-03-31 NOTE — Progress Notes (Signed)
Pt educated about importance of fingersticks and monitoring BS levels especially since he has had readings that were hypoglycemic. Pt verbalizes understanding and states " I am tired of getting my finger stuck , they are sore and I am not doing it ". Notified Dr. Marry Guan

## 2019-03-31 NOTE — Progress Notes (Signed)
Patient refusing finger sticks

## 2019-03-31 NOTE — Care Management Important Message (Signed)
Important Message  Patient Details  Name: Roxas Sigers MRN: [id number] Date of Birth: [birth year]   Medicare Important Message Given:  Yes     Lamonica Trueba Jen Mow, RN 03/31/2019, 1:25 PM

## 2019-03-31 NOTE — Progress Notes (Signed)
   Subjective: 3 Days Post-Op Procedure(s) (LRB): PATELLA TENDON REPAIR (Right) Patient reports pain as mild.   Patient is well, and has had no acute complaints or problems Doing well with therapy but progressing slowly Plan is to go Rehab after hospital stay. no nausea and no vomiting Patient denies any chest pains or shortness of breath. Objective: Vital signs in last 24 hours: Temp:  [98 F (36.7 C)-98.7 F (37.1 C)] 98 F (36.7 C) (09/07 0804) Pulse Rate:  [86-105] 105 (09/07 0804) Resp:  [16-20] 17 (09/07 0418) BP: (102-124)/(58-84) 121/70 (09/07 0804) SpO2:  [96 %-99 %] 99 % (09/07 0804) well approximated incision Heels are non tender and elevated off the bed using rolled towels Intake/Output from previous day: 09/06 0701 - 09/07 0700 In: 720 [P.O.:720] Out: 3950 [Urine:3950] Intake/Output this shift: No intake/output data recorded.  No results for input(s): HGB in the last 72 hours. No results for input(s): WBC, RBC, HCT, PLT in the last 72 hours. No results for input(s): NA, K, CL, CO2, BUN, CREATININE, GLUCOSE, CALCIUM in the last 72 hours. No results for input(s): LABPT, INR in the last 72 hours.  EXAM General - Patient is Alert, Appropriate and Oriented Extremity - Neurologically intact Neurovascular intact Sensation intact distally Intact pulses distally Dorsiflexion/Plantar flexion intact No cellulitis present Compartment soft  Range of motion knee brace in place Dressing - dressing C/D/I Motor Function - intact, moving foot and toes well on exam.    Past Medical History:  Diagnosis Date  . Arthritis   . Cervical spinal stenosis   . Diabetes mellitus without complication ([place])   . Essential tremor   . GERD (gastroesophageal reflux disease)   . History of hiatal hernia    fixed with gastric bypass surgery  . History of kidney stones   . Hypertension   . Hypoglycemia   . Non-traumatic rupture of right patellar tendon 03/25/2019  . Sleep apnea    had gastric bypass and no longer uses cpap machine  . Spinal stenosis, lumbar     Assessment/Plan: 3 Days Post-Op Procedure(s) (LRB): PATELLA TENDON REPAIR (Right) Active Problems:   Non-traumatic rupture of right patellar tendon  Estimated body mass index is 40.56 kg/m as calculated from the following:   Height as of this encounter: 5\' 8"  (1.727 m).   Weight as of this encounter: 121 kg. Up with therapy Discharge to SNF when bed is available  Labs: None DVT Prophylaxis - Lovenox, Foot Pumps and TED hose Weight-Bearing as tolerated to right leg Patient needs bowel movement Please change dressing prior to being discharged Patient will need to follow-up in [place] clinic in 2 weeks  Blanche Scovell R. [place] 03/31/2019, 8:38 AM

## 2019-03-31 NOTE — Progress Notes (Signed)
Pt had pulse rate of 101 at 1544 reassessed at 1800 , 98 at this time

## 2019-03-31 NOTE — Discharge Summary (Signed)
Physician Discharge Summary  Patient ID: Curtis Alexander MRN: [id number] DOB/AGE: Oct 19, 1945 73 y.o.  Admit date: 03/25/2019 Discharge date: 04/01/2019  Admission Diagnoses:  patellar tendon rupture   Discharge Diagnoses: Patient Active Problem List   Diagnosis Date Noted  . Non-traumatic rupture of right patellar tendon 03/25/2019  . 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 Essential tremor 03/04/2019  . Gout 03/04/2019  . Intestinal malabsorption 09/29/2018  . 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 Lumbar herniated disc 12/17/2012  . Nephrolithiasis 06/11/2012  . Anxiety disorder 03/12/2012  . Status post abdominoplasty 04/30/2007  . Absence of bladder continence 11/04/2003  . Back pain 11/04/2003  . Depression 11/04/2003  . Diabetes mellitus, type II ([place]) 11/04/2003  . Hypertension 11/04/2003  . Insomnia 11/04/2003  . Morbid obesity ([place]) 11/04/2003  . Orthopnea 11/04/2003  . Peptic ulcer disease 11/04/2003  . Sleep apnea 11/04/2003  . Venous insufficiency 11/04/2003    Past Medical History:  Diagnosis Date  . Arthritis   . Cervical spinal stenosis   . Diabetes mellitus without complication ([place])   . Essential tremor   . GERD (gastroesophageal reflux disease)   . History of hiatal hernia    fixed with gastric bypass surgery  . History of kidney stones   . Hypertension   . Hypoglycemia   . Non-traumatic rupture of right patellar tendon 03/25/2019  . Sleep apnea    had gastric bypass and no longer uses cpap machine  . Spinal stenosis, lumbar      Transfusion: No transfusions during this admission   Consultants (if any): None  Discharged Condition: Improved  Hospital Course: Curtis Alexander is an 73 y.o. male who was admitted 03/25/2019 with a diagnosis of ruptured patella tendon right knee status post total knee arthroplasty and went to the operating  room on 03/28/2019 and underwent the above named procedures.    Surgeries:Procedure(s): PATELLA TENDON REPAIR on 03/28/2019   Patient tolerated the surgery well. No complications .Patient was taken to PACU where she was stabilized and then transferred to the orthopedic floor.  Patient started on Lovenox 30 mg q 12 hrs. Foot pumps applied bilaterally at 80 mm hgb. Heels elevated off bed with rolled towels. No evidence of DVT. Calves non tender. Negative [place]. Physical therapy started on day #1 for gait training and transfer with OT starting on  day #1 for ADL and assisted devices. Patient has done well with therapy.  Patient's IV And Foley were discontinued on day #1 with Hemovac being discontinued on day #2. Dressing was changed on day 2 prior to patient being discharged   He was given perioperative antibiotics:  Anti-infectives (From admission, onward)   Start     Dose/Rate Route Frequency Ordered Stop   03/28/19 1845  ceFAZolin (ANCEF) IVPB 2g/100 mL premix     2 g 200 mL/hr over 30 Minutes Intravenous Every 6 hours 03/28/19 1647 03/29/19 1858   03/28/19 1136  ceFAZolin (ANCEF) 2-4 GM/100ML-% IVPB    Note to Pharmacy: [facility] Reader   : cabinet override      03/28/19 1136 03/28/19 1245   03/28/19 0600  ceFAZolin (ANCEF) IVPB 2g/100 mL premix    Note to Pharmacy: [place]. DO NOT ADMINISTER ON THE UNIT!   2 g 200 mL/hr over 30 Minutes Intravenous To Surgery 03/27/19 2335 03/28/19 1255    .  He was fitted with AV  1 compression foot pump devices, instructed on heel pumps, early ambulation, and fitted with TED stockings bilaterally for DVT prophylaxis.  He benefited maximally from the hospital stay and there were no complications.    Recent vital signs:  Vitals:   03/31/19 0418 03/31/19 0804  BP: 124/84 121/70  Pulse: 98 (!) 105  Resp: 17   Temp: 98.3 F (36.8 C) 98 F (36.7 C)  SpO2: 96% 99%    Recent laboratory studies:  Lab Results  Component  Value Date   HGB 11.6 (L) 03/27/2019   HGB 9.3 (L) 03/08/2019   HGB 9.9 (L) 03/07/2019   Lab Results  Component Value Date   WBC 5.2 03/27/2019   PLT 185 03/27/2019   Lab Results  Component Value Date   INR 1.2 03/27/2019   Lab Results  Component Value Date   NA 141 03/27/2019   K 4.2 03/27/2019   CL 110 03/27/2019   CO2 25 03/27/2019   BUN 21 03/27/2019   CREATININE 0.71 03/27/2019   GLUCOSE 94 03/27/2019    Discharge Medications:   Allergies as of 03/31/2019   No Known Allergies     Medication List    STOP taking these medications   aspirin EC 81 MG tablet     TAKE these medications   allopurinol 300 MG tablet Commonly known as: ZYLOPRIM Take 300 mg by mouth daily with lunch.   B-12 2500 MCG Tabs Take 2,500 mcg by mouth daily.   brimonidine 0.2 % ophthalmic solution Commonly known as: ALPHAGAN Place 1 drop into the right eye 2 (two) times daily.   celecoxib 200 MG capsule Commonly known as: CELEBREX Take 1 capsule (200 mg total) by mouth 2 (two) times daily.   clonazePAM 0.5 MG tablet Commonly known as: KLONOPIN Take 0.25 mg by mouth 2 (two) times daily.   cyclobenzaprine 10 MG tablet Commonly known as: FLEXERIL Take 10 mg by mouth 3 (three) times daily.   enoxaparin 40 MG/0.4ML injection Commonly known as: LOVENOX Inject 0.4 mLs (40 mg total) into the skin daily for 14 doses.   erythromycin ophthalmic ointment Place 1 application into the left eye at bedtime as needed.   ferrous sulfate 325 (65 FE) MG tablet Take 1 tablet (325 mg total) by mouth 2 (two) times daily with a meal.   finasteride 5 MG tablet Commonly known as: PROSCAR Take 5 mg by mouth daily with lunch.   latanoprost 0.005 % ophthalmic solution Commonly known as: XALATAN Place 1 drop into the right eye at bedtime.   loratadine 10 MG tablet Commonly known as: CLARITIN Take 10 mg by mouth daily as needed for allergies.   metFORMIN 500 MG tablet Commonly known as:  GLUCOPHAGE Take 250 mg by mouth 2 (two) times daily with a meal.   mirtazapine 45 MG tablet Commonly known as: REMERON Take 45 mg by mouth at bedtime.   omeprazole 40 MG capsule Commonly known as: PRILOSEC Take 40 mg by mouth daily with lunch.   oxyCODONE 5 MG immediate release tablet Commonly known as: Oxy IR/ROXICODONE Take 1 tablet (5 mg total) by mouth every 4 (four) hours as needed for moderate pain (pain score 4-6).   primidone 50 MG tablet Commonly known as: MYSOLINE Take 50-75 mg by mouth See admin instructions. Take 1 tablets (75mg ) by mouth every morning and take 1 tablet (50mg ) by mouth every afternoon   propranolol 40 MG tablet Commonly known as: INDERAL Take 40 mg by mouth 2 (two) times  daily.   simvastatin 20 MG tablet Commonly known as: ZOCOR Take 1 tablet by mouth every evening.   tamsulosin 0.4 MG Caps capsule Commonly known as: FLOMAX Take 0.4 mg by mouth at bedtime.   topiramate 50 MG tablet Commonly known as: TOPAMAX Take 50-100 mg by mouth See admin instructions. Take 2 tablets (100mg ) by mouth every morning at 9AM and take 1 tablet (50mg ) by mouth daily at lunch   traMADol 50 MG tablet Commonly known as: ULTRAM Take 1-2 tablets (50-100 mg total) by mouth every 4 (four) hours as needed for moderate pain.   vitamin C 1000 MG tablet Take 1,000 mg by mouth daily.   Vitamin D 50 MCG (2000 UT) Caps Take 2,000 Units by mouth daily.   zolpidem 5 MG tablet Commonly known as: AMBIEN Take 5 mg by mouth at bedtime as needed for sleep.            Durable Medical Equipment  (From admission, onward)         Start     Ordered   03/28/19 1648  DME Walker rolling  Once    Question:  Patient needs a walker to treat with the following condition  Answer:  Total knee replacement status   03/28/19 1647   03/28/19 1648  DME Bedside commode  Once    Question:  Patient needs a bedside commode to treat with the following condition  Answer:  Total knee  replacement status   03/28/19 1647          Diagnostic Studies: [place] Complete Joint Space Structures Low Right  Result Date: 03/26/2019 CLINICAL DATA:  Fall after recent TKA.  Knee pain. EXAM: RIGHT LOWER EXTREMITY SOFT TISSUE ULTRASOUND COMPLETE TECHNIQUE: Ultrasound examination was performed including evaluation of the muscles, tendons, joint, and adjacent soft tissues. COMPARISON:  X-ray 03/05/2019 FINDINGS: Examination is somewhat limited secondary to midline incision site. The mid to distal patellar tendon is diffusely heterogeneous with loss of the normal tendon echotexture. No normal tendinous insertion on the proximal tibia was visualized. There is a 0.8 x 0.5 x 0.9 cm heterogeneously hypoechoic fluid collection within the region of the tibial tuberosity. Diffuse edema with heterogeneity of the soft tissues in the infrapatellar aspect of the knee. There is a very large irregular complex fluid collection with mobile internal echoes superficial to the distal quadriceps muscle and tendon in total measuring approximately 1.6 x 1.9 cm transverse by 10.8 cm craniocaudal. The underlying quadriceps tendon appears intact with preserved fibrillar echotexture. The distal insertion on the superior patella appears intact. IMPRESSION: 1. Nonvisualization of the mid to distal right patellar tendon suggesting complete or near-complete tendinous rupture. 2. Small complex fluid collection measuring up to 0.9 cm in the expected location of the distal patellar tendon attachment, likely small hematoma. 3. Large complex fluid collection within the soft tissues superficial to the distal quadriceps muscle and tendon measuring up to 10.8 cm in length likely representing a large hematoma. 4. The underlying quadriceps tendon appears intact. Electronically Signed   By: Davina Poke M.D.   On: 03/26/2019 15:33   Dg Chest Port 1 View  Result Date: 03/06/2019 CLINICAL DATA:  Dyspnea EXAM: PORTABLE CHEST 1 VIEW COMPARISON:   None. FINDINGS: No focal opacity or pleural effusion. Normal heart size. No pneumothorax. Slight elevation of right diaphragm.: Inter positioning beneath the right diaphragm. IMPRESSION: No active disease. Electronically Signed   By: Donavan Foil M.D.   On: 03/06/2019 19:37   Dg Knee Right  Port  Result Date: 03/05/2019 CLINICAL DATA:  Postoperative for knee arthroplasty EXAM: PORTABLE RIGHT KNEE - 1-2 VIEW COMPARISON:  None. FINDINGS: Status post right total knee arthroplasty with well-positioned right distal femoral and right proximal tibial prostheses. Surgical drain terminates in the suprapatellar region. No acute osseous fracture. No dislocation. No suspicious focal osseous lesions. Vertical skin staples anteriorly in the midline. Expected soft tissue swelling and gas anteriorly about the right knee joint. IMPRESSION: Satisfactory immediate postoperative appearance status post right total knee arthroplasty. Electronically Signed   By: Ilona Sorrel M.D.   On: 03/05/2019 17:37    Disposition:   Discharge Instructions    Diet - low sodium heart healthy   Complete by: As directed    Increase activity slowly   Complete by: As directed          Signed: Watt Climes 03/31/2019, 8:48 AM

## 2019-03-31 NOTE — Progress Notes (Signed)
Occupational Therapy Treatment Patient Details Name: Curtis Alexander MRN: [id number] DOB: [birth year] Today's Date: 03/31/2019    History of present illness Delwood Philipps is a 66yoM who comes [facility] after a fall, wherein sustaining a Rt patellar ligament rupture, TKA same side ~2weeks prior. PMH bilat knee DJD, LSS, DM, HT.   OT comments  Pt minimally participatory in education re: AE for LB ADLs, in addition, pt declines to sit EOB with OT this date (see details below). Pt verbalizes correct use of AE, but declines to demonstrate. Using long sitting position in bed with HOB elevated ~70-80 degress, pt demons ability to doff L sock, but is unable to don/doff R sock due to brace in place, OT attempts to educate pt that this is reasoning for use of AE during recovery from surgery, but pt states he has family help for this aspect of dressing. Note: OT provides education re: benefits of participating in all ADLs/mobility at highest attainable level of independence with pt confirming understanding, but still declining to demo this date. Pt still requiring MOD A for most aspects of LB ADLs at this time, and decreased tolerance for ADL mobility/transfers. SNF still remains most prudent discharge disposition at this time.    Follow Up Recommendations  SNF    Equipment Recommendations  None recommended by OT    Recommendations for Other Services      Precautions / Restrictions Precautions Precautions: Fall;Knee Precaution Comments: BP monitoring Required Braces or Orthoses: Knee Immobilizer - Right(goniometric knee brace-right) Knee Immobilizer - Right: On at all times Restrictions Weight Bearing Restrictions: Yes RLE Weight Bearing: Weight bearing as tolerated       Mobility Bed Mobility               General bed mobility comments: deferred on OT treatment as pt self-limits stating "I've already been up to use the commode and I'm not getting up again until physical  therapy"  Transfers                 General transfer comment: deferred on OT treatment as pt self-limits stating "I've already been up to use the commode and I'm not getting up again until physical therapy"    Balance       Sitting balance - Comments: defer       Standing balance comment: defer                           ADL either performed or assessed with clinical judgement   ADL Overall ADL's : Needs assistance/impaired                     Lower Body Dressing: Moderate assistance;With adaptive equipment;Bed level Lower Body Dressing Details (indicate cue type and reason): in long sitting, pt demos ability to don sock to L LE, requires MOD A for R LE, declines use of AE although OT provided education, stating "I have a daughter and granddaughter who can help with that stuff," despite education re: importance of independent self care completion                     Vision Patient Visual Report: No change from baseline     Perception     Praxis      Cognition Arousal/Alertness: Awake/alert Behavior During Therapy: WFL for tasks assessed/performed Overall Cognitive Status: Within Functional Limits for tasks assessed  Exercises Other Exercises Other Exercises: OT facilitates education including demonstration re: use of AE for LB dressing, pt minimally engages on teach back in long sitting in bed with HOB elevated d/t declining to sit EOB for participation this date. Other Exercises: OT facilitates review re: polar care and stocking mgt, with pt stating his daughter can help with that.   Shoulder Instructions       General Comments      Pertinent Vitals/ Pain       Pain Assessment: 0-10 Pain Score: 5  Pain Location: R knee Pain Descriptors / Indicators: Aching Pain Intervention(s): Limited activity within patient's tolerance;Monitored during session  Home Living                                           Prior Functioning/Environment              Frequency  Min 1X/week        Progress Toward Goals  OT Goals(current goals can now be found in the care plan section)  Progress towards OT goals: Progressing toward goals(minimal progress this date, due to self limiting, but can verbalize appropriate use of most LB ADL AE)  Acute Rehab OT Goals Patient Stated Goal: Go to rehab and home OT Goal Formulation: With patient Time For Goal Achievement: 04/12/19 Potential to Achieve Goals: Good  Plan Discharge plan remains appropriate;Frequency remains appropriate    Co-evaluation                 AM-PAC OT "6 Clicks" Daily Activity     Outcome Measure   Help from another person eating meals?: None Help from another person taking care of personal grooming?: None Help from another person toileting, which includes using toliet, bedpan, or urinal?: A Lot Help from another person bathing (including washing, rinsing, drying)?: A Lot Help from another person to put on and taking off regular upper body clothing?: None Help from another person to put on and taking off regular lower body clothing?: A Lot 6 Click Score: 18    End of Session    OT Visit Diagnosis: Other abnormalities of gait and mobility (R26.89);Muscle weakness (generalized) (M62.81);Unsteadiness on feet (R26.81) Pain - Right/Left: Right Pain - part of body: Knee   Activity Tolerance Patient tolerated treatment well   Patient Left in bed;with call bell/phone within reach;with bed alarm set   Nurse Communication          Time: [id number] OT Time Calculation (min): 32 min  Charges: OT General Charges $OT Visit: 1 Visit OT Treatments $Self Care/Home Management : 23-37 mins  Gerrianne Scale, MS, OTR/L ascom [phone] or [phone] 03/31/19, 12:55 PM

## 2019-03-31 NOTE — TOC Progression Note (Signed)
Transition of Care [facility]) - Progression Note    Patient Details  Name: Curtis Alexander MRN: [id number] Date of Birth: 21-Jul-1946  Transition of Care [facility]) CM/SW Contact  Su Hilt, RN Phone Number: 03/31/2019, 1:47 PM  Clinical Narrative:     Jani Files to [facility] in [place] to [phone] Roland Earl the referral to [phone] Called and left a VM for Kristi requesting a bed offer   Expected Discharge Plan: [place] Barriers to Discharge: Continued Medical Work up  Expected Discharge Plan and Services Expected Discharge Plan: [place], [place] Living arrangements for the past 2 months: [place]: PT [place]: [facility] Date [place]: 03/30/19 Time [place]: [phone] Representative spoke with at [place]: [place] ([place]) Interventions    Readmission Risk Interventions Readmission Risk Prevention Plan 03/30/2019  Transportation Screening Complete  PCP or Specialist Appt within 5-7 Days Complete  Home Care Screening Complete  Medication Review (RN CM) Complete

## 2019-03-31 NOTE — Progress Notes (Signed)
Physical Therapy Treatment Patient Details Name: Curtis Alexander MRN: [id number] DOB: November 18, 1945 Today's Date: 03/31/2019    History of Present Illness Curtis Alexander is a 66yoM who comes to [facility] after a fall, wherein sustaining a Rt patellar ligament rupture, TKA same side ~2weeks prior. PMH bilat knee DJD, LSS, DM, HT.    PT Comments    Pt agreeable to therapy and able to ambulate 18 feet with bariatric RW CGA; limited distance ambulating d/t fatigue.  Pain R knee 0/10 at rest beginning and end of session but 2/10 with ambulation.  Will continue to focus on strengthening and progressive functional mobility per pt tolerance.    Follow Up Recommendations  SNF     Equipment Recommendations  Rolling walker with 5" wheels(bariatric)    Recommendations for Other Services       Precautions / Restrictions Precautions Precautions: Fall;Knee Precaution Comments: BP monitoring Required Braces or Orthoses: Knee Immobilizer - Right Knee Immobilizer - Right: On at all times Knee Immobilizer - Left: On at all times Other Brace: hinged locking knee brace locked in ext except with 0-30 PROM with PT.  OK to do QS. Restrictions Weight Bearing Restrictions: Yes RLE Weight Bearing: Weight bearing as tolerated Other Position/Activity Restrictions: WBAT with knee brace locked in extension    Mobility  Bed Mobility Overal bed mobility: Modified Independent Bed Mobility: Supine to Sit;Sit to Supine     Supine to sit: Modified independent (Device/Increase time) Sit to supine: Modified independent (Device/Increase time)   General bed mobility comments: mild increased effort to perform on own  Transfers Overall transfer level: Needs assistance Equipment used: Rolling walker (2 wheeled) Transfers: Sit to/from Stand Sit to Stand: Min guard;From elevated surface         General transfer comment: increased effort to stand up to RW x2 reps from elevated  bed  Ambulation/Gait Ambulation/Gait assistance: Min guard Gait Distance (Feet): 18 Feet Assistive device: Rolling walker (2 wheeled) Gait Pattern/deviations: Step-to pattern;Trunk flexed;Wide base of support Gait velocity: decreased   General Gait Details: heavy UE support on RW; steady; limited distance d/t fatigue   Stairs             Wheelchair Mobility    Modified Rankin (Stroke Patients Only)       Balance Overall balance assessment: Needs assistance Sitting-balance support: No upper extremity supported;Feet supported Sitting balance-Leahy Scale: Good Sitting balance - Comments: steady sitting reaching within BOS   Standing balance support: Single extremity supported Standing balance-Leahy Scale: Poor Standing balance comment: pt requiring at least single UE support for static standing balance                            Cognition Arousal/Alertness: Awake/alert Behavior During Therapy: WFL for tasks assessed/performed Overall Cognitive Status: Within Functional Limits for tasks assessed                                        Exercises      General Comments   Nursing cleared pt for participation in physical therapy.  Pt agreeable to PT session.      Pertinent Vitals/Pain Pain Location: R knee Pain Intervention(s): Limited activity within patient's tolerance;Monitored during session;Repositioned    Home Living                      Prior Function  PT Goals (current goals can now be found in the care plan section) Acute Rehab PT Goals Patient Stated Goal: Go to rehab and home PT Goal Formulation: With patient Time For Goal Achievement: 04/12/19 Potential to Achieve Goals: Good Progress towards PT goals: Progressing toward goals    Frequency    BID      PT Plan Current plan remains appropriate    Co-evaluation              AM-PAC PT "6 Clicks" Mobility   Outcome Measure  Help  needed turning from your back to your side while in a flat bed without using bedrails?: None Help needed moving from lying on your back to sitting on the side of a flat bed without using bedrails?: None Help needed moving to and from a bed to a chair (including a wheelchair)?: A Little Help needed standing up from a chair using your arms (e.g., wheelchair or bedside chair)?: A Little Help needed to walk in hospital room?: A Little Help needed climbing 3-5 steps with a railing? : A Lot 6 Click Score: 19    End of Session Equipment Utilized During Treatment: Gait belt Activity Tolerance: Patient tolerated treatment well Patient left: in bed;with call bell/phone within reach;with bed alarm set;with SCD's reapplied;Other (comment)(pt refused towel rolls to elevated heels despite education) Nurse Communication: Mobility status;Precautions;Weight bearing status PT Visit Diagnosis: Muscle weakness (generalized) (M62.81);Difficulty in walking, not elsewhere classified (R26.2);Pain Pain - Right/Left: Right Pain - part of body: Knee     Time: [id number] PT Time Calculation (min) (ACUTE ONLY): 25 min  Charges:  $Gait Training: 8-22 mins $Therapeutic Activity: 8-22 mins                     Leitha Bleak, PT 03/31/19, 5:44 PM [phone]

## 2019-03-31 NOTE — Progress Notes (Signed)
Pt refusing fingersticks.

## 2019-03-31 NOTE — Progress Notes (Signed)
Pt is alert and oriented. Denies pain most of shift,. Polar care intact and functional , tolerates meds whole without difficulty . Oral intake adequate. 1 assist to [facility].  Uses urinal and is continent of bowel and bladder. Will continue to monitor

## 2019-04-01 LAB — SARS CORONAVIRUS 2 BY RT PCR (HOSPITAL ORDER, PERFORMED IN ~~LOC~~ HOSPITAL LAB): SARS Coronavirus 2: NEGATIVE

## 2019-04-01 NOTE — TOC Progression Note (Signed)
Transition of Care [facility]) - Progression Note    Patient Details  Name: Curtis Alexander MRN: [id number] Date of Birth: 1945/12/16  Transition of Care [facility]) CM/SW Contact  Su Hilt, RN Phone Number: 04/01/2019, 3:06 PM  Clinical Narrative:     Verita Lamb the patient's daughter at her request [phone], left a VM for a call back and provided my contact information  Expected Discharge Plan: [place] Barriers to Discharge: Continued Medical Work up  Expected Discharge Plan and Services Expected Discharge Plan: [place] Choice: [place], [place] Living arrangements for the past 2 months: [place] Expected Discharge Date: 04/01/19                         HH Arranged: PT [place]: [facility]) Date [place]: 03/30/19 Time [place]: [phone] Representative spoke with at [place]: [place] ([place]) Interventions    Readmission Risk Interventions Readmission Risk Prevention Plan 03/30/2019  Transportation Screening Complete  PCP or Specialist Appt within 5-7 Days Complete  Home Care Screening Complete  Medication Review (RN CM) Complete

## 2019-04-01 NOTE — Plan of Care (Signed)

## 2019-04-01 NOTE — Progress Notes (Signed)
Occupational Therapy Treatment Patient Details Name: Curtis Alexander MRN: [id number] DOB: 12-Feb-1946 Today's Date: 04/01/2019    History of present illness Curtis Alexander is a 8yoM who comes to [facility] after a fall, wherein sustaining a Rt patellar ligament rupture, TKA same side ~2weeks prior. PMH bilat knee DJD, LSS, DM, HT.   OT comments  Pt to go to SNF in [place] today to continue rehab.  Discussed rec for AD before going to rehab today in [place]. He has his daughter looking into a snap on basket for FWW to carry items like his phone and does not need any further AD for dressing.  His wife is in SNF in [place] and has been there for over a year per patient and he plans to return home with his daughter and granddaughter in [place] which was living arrangement prior to this second surgery for his knee.  Pain is present only when getting up and walking but not at rest.  Follow Up Recommendations  SNF    Equipment Recommendations  Other (comment)(reacher and walker basket or bag)    Recommendations for Other Services      Precautions / Restrictions Precautions Precautions: Fall;Knee Precaution Comments: BP monitoring Required Braces or Orthoses: Knee Immobilizer - Right Knee Immobilizer - Right: On at all times Other Brace: hinged locking knee brace locked in ext except with 0-30 PROM with PT.  OK to do QS. Restrictions Weight Bearing Restrictions: Yes RLE Weight Bearing: Weight bearing as tolerated Other Position/Activity Restrictions: WBAT with knee brace locked in extension       Mobility Bed Mobility Overal bed mobility: Modified Independent Bed Mobility: Supine to Sit;Sit to Supine     Supine to sit: Modified independent (Device/Increase time) Sit to supine: Modified independent (Device/Increase time)   General bed mobility comments: Pt did reasonably well getting LEs out of and back into bed w/o excessive cuing or direct assist from PT.  Pt somewhat slow, but  determined to do it, did need flat bed and some assist to scoot up/over in bed once back to supine  Transfers Overall transfer level: Needs assistance Equipment used: Rolling walker (2 wheeled) Transfers: Sit to/from Stand Sit to Stand: Min guard         General transfer comment: elevated bed a few inches (L knee at 90), heavy reliance on UEs as expected but able to rise with only cuing and no direct phyiscal assist    Balance Overall balance assessment: Needs assistance Sitting-balance support: No upper extremity supported;Feet supported Sitting balance-Leahy Scale: Good Sitting balance - Comments: steady sitting reaching within BOS     Standing balance-Leahy Scale: Fair Standing balance comment: Pt reliant on UEs/AD to maintain standing balance                           ADL either performed or assessed with clinical judgement   ADL Overall ADL's : Needs assistance/impaired                                       General ADL Comments: Discussed rec for AD before going to rehab today in [place]. He has his daufgher looking into a snap on basket for FWW to carry items like his phone and does not need any further AD for dressing.     Vision       Perception  Praxis      Cognition Arousal/Alertness: Awake/alert Behavior During Therapy: WFL for tasks assessed/performed Overall Cognitive Status: Within Functional Limits for tasks assessed                                 General Comments: pleasant disposition        Exercises Exercises: Total Joint Total Joint Exercises Ankle Circles/Pumps: Strengthening;Both;10 reps Quad Sets: Strengthening;Both;10 reps Gluteal Sets: Strengthening;Both;10 reps Heel Slides: PROM;Right;10 reps(KI to 30 degrees flexion only, no resistance leg extensions) Hip ABduction/ADduction: Strengthening;10 reps Straight Leg Raises: AROM;Right;10 reps   Shoulder Instructions       General Comments       Pertinent Vitals/ Pain       Pain Assessment: No/denies pain Pain Score: 3   Home Living                                          Prior Functioning/Environment              Frequency  Min 1X/week        Progress Toward Goals  OT Goals(current goals can now be found in the care plan section)  Progress towards OT goals: Progressing toward goals  Acute Rehab OT Goals Patient Stated Goal: Go to rehab and home OT Goal Formulation: With patient Time For Goal Achievement: 04/12/19 Potential to Achieve Goals: Good  Plan Discharge plan remains appropriate;Frequency remains appropriate    Co-evaluation                 AM-PAC OT "6 Clicks" Daily Activity     Outcome Measure   Help from another person eating meals?: None Help from another person taking care of personal grooming?: None Help from another person toileting, which includes using toliet, bedpan, or urinal?: A Lot Help from another person bathing (including washing, rinsing, drying)?: A Lot Help from another person to put on and taking off regular upper body clothing?: None Help from another person to put on and taking off regular lower body clothing?: A Lot 6 Click Score: 18    End of Session    OT Visit Diagnosis: Other abnormalities of gait and mobility (R26.89);Muscle weakness (generalized) (M62.81);Unsteadiness on feet (R26.81)   Activity Tolerance Patient tolerated treatment well   Patient Left in bed;with call bell/phone within reach;with bed alarm set   Nurse Communication          Time: [id number] OT Time Calculation (min): 24 min  Charges: OT General Charges $OT Visit: 1 Visit OT Treatments $Self Care/Home Management : 23-37 mins  Chrys Racer, OTR/L, [place] ascom [phone] 04/01/19, 12:08 PM

## 2019-04-01 NOTE — TOC Transition Note (Signed)
Transition of Care [facility]) - CM/SW Discharge Note   Patient Details  Name: Curtis Alexander MRN: [id number] Date of Birth: 1945-10-08  Transition of Care [facility]) CM/SW Contact:  Su Hilt, RN Phone Number: 04/01/2019, 10:18 AM   Clinical Narrative:     Spoke with [name] at the facility in [place] they do have a bed and she has already spoke to the daughter [name], I called and spoke to the daughter [name] at [phone] and she will rent a car and pick the patient up to transport to the facility  Final next level of care: [place] Barriers to Discharge: Continued Medical Work up   Patient Goals and CMS Choice   CMS Medicare.gov Compare Post Acute Care list provided to:: Patient Choice offered to / list presented to : Patient  Discharge Placement                       Discharge Plan and Services     Post Acute Care Choice: Home Health, Skilled Nursing Facility                    HH Arranged: PT [facility] Agency: [facility] Date [place]: 03/30/19 Time [place]: [phone] Representative spoke with at [place]: [place] ([place]) Interventions     Readmission Risk Interventions Readmission Risk Prevention Plan 03/30/2019  Transportation Screening Complete  PCP or Specialist Appt within 5-7 Days Complete  Home Care Screening Complete  Medication Review (RN CM) Complete

## 2019-04-01 NOTE — Progress Notes (Signed)
Physical Therapy Treatment Patient Details Name: Curtis Alexander MRN: [id number] DOB: 28-Dec-1945 Today's Date: 04/01/2019    History of Present Illness Curtis Alexander is a 32yoM who comes to [facility] after a fall, wherein sustaining a Rt patellar ligament rupture, TKA same side ~2weeks prior. PMH bilat knee DJD, LSS, DM, HT.    PT Comments    Pt eager to work with PT and eager to leave the hospital and get to rehab today.  He was able to do most of the mobility tasks w/o direct assist though at times needed to assist R LE with UEs and generally was slow/cautious with mobility, though again generally did quite well.  Pt fatigued quickly with modest bout of slow and labored ambulation in the room, however he was safe and steady t/o that effort.   Follow Up Recommendations  SNF     Equipment Recommendations       Recommendations for Other Services       Precautions / Restrictions Precautions Precautions: Fall;Knee Required Braces or Orthoses: Knee Immobilizer - Right Knee Immobilizer - Right: On at all times Other Brace: hinged locking knee brace locked in ext except with 0-30 PROM with PT.  OK to do QS. Restrictions RLE Weight Bearing: Weight bearing as tolerated    Mobility  Bed Mobility Overal bed mobility: Modified Independent Bed Mobility: Supine to Sit;Sit to Supine     Supine to sit: Modified independent (Device/Increase time) Sit to supine: Modified independent (Device/Increase time)   General bed mobility comments: Pt did reasonably well getting LEs out of and back into bed w/o excessive cuing or direct assist from PT.  Pt somewhat slow, but determined to do it, did need flat bed and some assist to scoot up/over in bed once back to supine  Transfers Overall transfer level: Needs assistance Equipment used: Rolling walker (2 wheeled) Transfers: Sit to/from Stand Sit to Stand: Min guard         General transfer comment: elevated bed a few inches (L knee at 90),  heavy reliance on UEs as expected but able to rise with only cuing and no direct phyiscal assist  Ambulation/Gait Ambulation/Gait assistance: Min guard Gait Distance (Feet): 35 Feet Assistive device: Rolling walker (2 wheeled)       General Gait Details: Pt leaning over heavily on walker, very reliant on UEs.  Pt fatigues quickly with the effort and though he was highly motivated did not have the endurace to to much more this date.   Stairs             Wheelchair Mobility    Modified Rankin (Stroke Patients Only)       Balance Overall balance assessment: Needs assistance Sitting-balance support: No upper extremity supported;Feet supported Sitting balance-Leahy Scale: Good Sitting balance - Comments: steady sitting reaching within BOS     Standing balance-Leahy Scale: Fair Standing balance comment: Pt reliant on UEs/AD to maintain standing balance                            Cognition Arousal/Alertness: Awake/alert Behavior During Therapy: WFL for tasks assessed/performed Overall Cognitive Status: Within Functional Limits for tasks assessed                                 General Comments: pleasant disposition      Exercises Total Joint Exercises Ankle Circles/Pumps: Strengthening;Both;10 reps Quad Sets: Strengthening;Both;10  reps Gluteal Sets: Strengthening;Both;10 reps Heel Slides: PROM;Right;10 reps(KI to 30 degrees flexion only, no resistance leg extensions) Hip ABduction/ADduction: Strengthening;10 reps Straight Leg Raises: AROM;Right;10 reps    General Comments        Pertinent Vitals/Pain Pain Assessment: 0-10 Pain Score: 3     Home Living                      Prior Function            PT Goals (current goals can now be found in the care plan section) Progress towards PT goals: Progressing toward goals    Frequency    BID      PT Plan Current plan remains appropriate    Co-evaluation               AM-PAC PT "6 Clicks" Mobility   Outcome Measure  Help needed turning from your back to your side while in a flat bed without using bedrails?: None Help needed moving from lying on your back to sitting on the side of a flat bed without using bedrails?: A Little Help needed moving to and from a bed to a chair (including a wheelchair)?: A Little Help needed standing up from a chair using your arms (e.g., wheelchair or bedside chair)?: A Little Help needed to walk in hospital room?: A Little Help needed climbing 3-5 steps with a railing? : A Lot 6 Click Score: 18    End of Session Equipment Utilized During Treatment: Gait belt Activity Tolerance: Patient tolerated treatment well Patient left: in bed;with call bell/phone within reach;with bed alarm set;with SCD's reapplied;Other (comment) Nurse Communication: Mobility status;Precautions;Weight bearing status PT Visit Diagnosis: Muscle weakness (generalized) (M62.81);Difficulty in walking, not elsewhere classified (R26.2);Pain Pain - Right/Left: Right Pain - part of body: Knee     Time: [id number] PT Time Calculation (min) (ACUTE ONLY): 44 min  Charges:  $Gait Training: 8-22 mins $Therapeutic Exercise: 8-22 mins $Therapeutic Activity: 8-22 mins                     Kreg Shropshire, DPT 04/01/2019, 11:21 AM

## 2019-04-01 NOTE — Progress Notes (Signed)
ORTHOPAEDICS PROGRESS NOTE  PATIENT NAME: Curtis Alexander DOB: 04-10-1946  MRN: [id number]  POD # 4: Right patellar tendon repair  Subjective: Resting comfortably this morning.  He denies any significant knee pain.  Objective: Vital signs in last 24 hours: Temp:  [98 F (36.7 C)-98.7 F (37.1 C)] 98.7 F (37.1 C) (09/07 2320) Pulse Rate:  [96-106] 96 (09/07 2320) Resp:  [19] 19 (09/07 2320) BP: (100-131)/(68-89) 100/68 (09/07 2320) SpO2:  [97 %-100 %] 100 % (09/07 2320)  Intake/Output from previous day: 09/07 0701 - 09/08 0700 In: -  Out: 1200 [Urine:1200]  No results for input(s): WBC, HGB, HCT, PLT, K, CL, CO2, BUN, CREATININE, GLUCOSE, CALCIUM, LABPT, INR in the last 72 hours.  EXAM General: Well-developed well-nourished male seen in no apparent discomfort. Right lower extremity: Knee range of motion brace is intact and locked in extension.  The patient is able to perform an independent straight leg raise.  Homans test is negative.  Polar Care is in place and functional. Neurologic: Awake, alert, and oriented.  Sensory and motor function are intact.  Assessment: Right patellar tendon repair  Secondary diagnoses: Right total knee arthroplasty Sleep apnea Hypertension Gastroesophageal reflux disease Diabetes Spinal stenosis Morbid obesity (BMI greater than 40)  Plan: Physical therapy notes were reviewed.  The patient is making slow but steady progress with physical therapy. The patient has been relatively tachycardic since he has refused his propranolol.  The importance of maintaining a steady regimen was discussed with the patient. Plan is to go Skilled nursing facility after hospital stay.  Packet has been forwarded to the facility in [place] and we are awaiting a response. DVT Prophylaxis - Lovenox, Foot Pumps and TED hose  [name] M.D.

## 2019-04-01 NOTE — TOC Progression Note (Signed)
Transition of Care [facility]) - Progression Note    Patient Details  Name: Shauntez Constante MRN: [id number] Date of Birth: 02-Apr-1946  Transition [facility] Care [facility]) CM/SW Contact  Su Hilt, RN Phone Number: 04/01/2019, 3:19 PM  Clinical Narrative:     Daughter [name] called me back and stated that she was here to pick up the patient for DC.  I let the Bedside Nurse know   Expected Discharge Plan: [place] Barriers to Discharge: Continued Medical Work up  Expected Discharge Plan and Services Expected Discharge Plan: [place] Choice: [place] Living arrangements for the past 2 months: Single Family Home Expected Discharge Date: 04/01/19                         HH Arranged: PT New Site: [facility] Date [place]: 03/30/19 Time [place]: [phone] Representative spoke with at [place]: [place] ([place]) Interventions    Readmission Risk Interventions Readmission Risk Prevention Plan 03/30/2019  Transportation Screening Complete  PCP or Specialist Appt within 5-7 Days Complete  Home Care Screening Complete  Medication Review (RN CM) Complete

## 2019-04-01 NOTE — Progress Notes (Signed)
Report called to [employer], LPN. IV discontinued with difficulty. Daughter here for transport to rehab facility.

## 2019-04-15 ENCOUNTER — Encounter: Payer: Self-pay | Admitting: Orthopedic Surgery

## 2020-01-08 ENCOUNTER — Other Ambulatory Visit: Payer: Self-pay

## 2020-01-08 ENCOUNTER — Encounter: Payer: Self-pay | Admitting: Emergency Medicine

## 2020-01-08 DIAGNOSIS — E785 Hyperlipidemia, unspecified: Secondary | ICD-10-CM | POA: Diagnosis present

## 2020-01-08 DIAGNOSIS — N3000 Acute cystitis without hematuria: Secondary | ICD-10-CM | POA: Diagnosis not present

## 2020-01-08 DIAGNOSIS — M4802 Spinal stenosis, cervical region: Secondary | ICD-10-CM | POA: Diagnosis present

## 2020-01-08 DIAGNOSIS — Z79899 Other long term (current) drug therapy: Secondary | ICD-10-CM

## 2020-01-08 DIAGNOSIS — Z20822 Contact with and (suspected) exposure to covid-19: Secondary | ICD-10-CM | POA: Diagnosis present

## 2020-01-08 DIAGNOSIS — K219 Gastro-esophageal reflux disease without esophagitis: Secondary | ICD-10-CM | POA: Diagnosis present

## 2020-01-08 DIAGNOSIS — Z87442 Personal history of urinary calculi: Secondary | ICD-10-CM

## 2020-01-08 DIAGNOSIS — Z6841 Body Mass Index (BMI) 40.0 and over, adult: Secondary | ICD-10-CM

## 2020-01-08 DIAGNOSIS — G4730 Sleep apnea, unspecified: Secondary | ICD-10-CM | POA: Diagnosis present

## 2020-01-08 DIAGNOSIS — Z9119 Patient's noncompliance with other medical treatment and regimen: Secondary | ICD-10-CM

## 2020-01-08 DIAGNOSIS — H409 Unspecified glaucoma: Secondary | ICD-10-CM | POA: Diagnosis present

## 2020-01-08 DIAGNOSIS — E538 Deficiency of other specified B group vitamins: Secondary | ICD-10-CM | POA: Diagnosis present

## 2020-01-08 DIAGNOSIS — Z95828 Presence of other vascular implants and grafts: Secondary | ICD-10-CM

## 2020-01-08 DIAGNOSIS — I110 Hypertensive heart disease with heart failure: Secondary | ICD-10-CM | POA: Diagnosis present

## 2020-01-08 DIAGNOSIS — E119 Type 2 diabetes mellitus without complications: Secondary | ICD-10-CM | POA: Diagnosis present

## 2020-01-08 DIAGNOSIS — Z87440 Personal history of urinary (tract) infections: Secondary | ICD-10-CM

## 2020-01-08 DIAGNOSIS — I5033 Acute on chronic diastolic (congestive) heart failure: Secondary | ICD-10-CM | POA: Diagnosis present

## 2020-01-08 DIAGNOSIS — Z9884 Bariatric surgery status: Secondary | ICD-10-CM

## 2020-01-08 DIAGNOSIS — F419 Anxiety disorder, unspecified: Secondary | ICD-10-CM | POA: Diagnosis present

## 2020-01-08 DIAGNOSIS — M109 Gout, unspecified: Secondary | ICD-10-CM | POA: Diagnosis present

## 2020-01-08 DIAGNOSIS — Z96651 Presence of right artificial knee joint: Secondary | ICD-10-CM | POA: Diagnosis present

## 2020-01-08 DIAGNOSIS — I872 Venous insufficiency (chronic) (peripheral): Secondary | ICD-10-CM | POA: Diagnosis present

## 2020-01-08 DIAGNOSIS — Z7984 Long term (current) use of oral hypoglycemic drugs: Secondary | ICD-10-CM

## 2020-01-08 DIAGNOSIS — G9341 Metabolic encephalopathy: Secondary | ICD-10-CM | POA: Diagnosis present

## 2020-01-08 DIAGNOSIS — N390 Urinary tract infection, site not specified: Secondary | ICD-10-CM | POA: Diagnosis not present

## 2020-01-08 DIAGNOSIS — N400 Enlarged prostate without lower urinary tract symptoms: Secondary | ICD-10-CM | POA: Diagnosis present

## 2020-01-08 LAB — CBC
HCT: 42.3 % (ref 39.0–52.0)
Hemoglobin: 12.9 g/dL — ABNORMAL LOW (ref 13.0–17.0)
MCH: 29.9 pg (ref 26.0–34.0)
MCHC: 30.5 g/dL (ref 30.0–36.0)
MCV: 98.1 fL (ref 80.0–100.0)
Platelets: 138 10*3/uL — ABNORMAL LOW (ref 150–400)
RBC: 4.31 MIL/uL (ref 4.22–5.81)
RDW: 13.7 % (ref 11.5–15.5)
WBC: 5.7 10*3/uL (ref 4.0–10.5)
nRBC: 0 % (ref 0.0–0.2)

## 2020-01-08 LAB — BASIC METABOLIC PANEL
Anion gap: 8 (ref 5–15)
BUN: 25 mg/dL — ABNORMAL HIGH (ref 8–23)
CO2: 30 mmol/L (ref 22–32)
Calcium: 8.2 mg/dL — ABNORMAL LOW (ref 8.9–10.3)
Chloride: 104 mmol/L (ref 98–111)
Creatinine, Ser: 0.86 mg/dL (ref 0.61–1.24)
GFR calc Af Amer: >60 mL/min (ref >60)
GFR calc non Af Amer: >60 mL/min (ref >60)
Glucose, Bld: 150 mg/dL — ABNORMAL HIGH (ref 70–99)
Potassium: 3.7 mmol/L (ref 3.5–5.1)
Sodium: 142 mmol/L (ref 135–145)

## 2020-01-08 LAB — TROPONIN I (HIGH SENSITIVITY): Troponin I (High Sensitivity): 12 ng/L (ref <18)

## 2020-01-08 NOTE — ED Triage Notes (Signed)
Patient presents to the ED with increased weakness x 54month since being discharged from rehab where he was staying after a knee surgery.  Patient states at the rehab he had multiple UTIs and other infections.  Patient states he was put on "cranberry tablets" for his UTI and he states symptoms have not improved.  Patient reports foul smelling urine.  Patient states he is now having difficulty walking due to weakness.

## 2020-01-09 ENCOUNTER — Inpatient Hospital Stay
Admission: EM | Admit: 2020-01-09 | Discharge: 2020-01-11 | DRG: 689 | Disposition: A | Discharge status: Home Health | Payer: Medicare Other | Attending: Internal Medicine | Admitting: Internal Medicine

## 2020-01-09 ENCOUNTER — Ambulatory Visit: Payer: Medicare Other | Admitting: Urology

## 2020-01-09 ENCOUNTER — Emergency Department: Payer: Medicare Other

## 2020-01-09 DIAGNOSIS — G4730 Sleep apnea, unspecified: Secondary | ICD-10-CM | POA: Diagnosis present

## 2020-01-09 DIAGNOSIS — Z79899 Other long term (current) drug therapy: Secondary | ICD-10-CM | POA: Diagnosis not present

## 2020-01-09 DIAGNOSIS — I110 Hypertensive heart disease with heart failure: Secondary | ICD-10-CM | POA: Diagnosis present

## 2020-01-09 DIAGNOSIS — Z6841 Body Mass Index (BMI) 40.0 and over, adult: Secondary | ICD-10-CM | POA: Diagnosis not present

## 2020-01-09 DIAGNOSIS — Z96651 Presence of right artificial knee joint: Secondary | ICD-10-CM | POA: Diagnosis present

## 2020-01-09 DIAGNOSIS — M4802 Spinal stenosis, cervical region: Secondary | ICD-10-CM | POA: Diagnosis present

## 2020-01-09 DIAGNOSIS — K219 Gastro-esophageal reflux disease without esophagitis: Secondary | ICD-10-CM | POA: Diagnosis present

## 2020-01-09 DIAGNOSIS — M109 Gout, unspecified: Secondary | ICD-10-CM | POA: Diagnosis present

## 2020-01-09 DIAGNOSIS — N390 Urinary tract infection, site not specified: Secondary | ICD-10-CM | POA: Diagnosis present

## 2020-01-09 DIAGNOSIS — I872 Venous insufficiency (chronic) (peripheral): Secondary | ICD-10-CM | POA: Diagnosis present

## 2020-01-09 DIAGNOSIS — N3000 Acute cystitis without hematuria: Secondary | ICD-10-CM | POA: Diagnosis present

## 2020-01-09 DIAGNOSIS — Z96659 Presence of unspecified artificial knee joint: Secondary | ICD-10-CM

## 2020-01-09 DIAGNOSIS — Z95828 Presence of other vascular implants and grafts: Secondary | ICD-10-CM | POA: Diagnosis not present

## 2020-01-09 DIAGNOSIS — N400 Enlarged prostate without lower urinary tract symptoms: Secondary | ICD-10-CM | POA: Diagnosis present

## 2020-01-09 DIAGNOSIS — Z9884 Bariatric surgery status: Secondary | ICD-10-CM | POA: Diagnosis not present

## 2020-01-09 DIAGNOSIS — N401 Enlarged prostate with lower urinary tract symptoms: Secondary | ICD-10-CM

## 2020-01-09 DIAGNOSIS — H409 Unspecified glaucoma: Secondary | ICD-10-CM | POA: Diagnosis present

## 2020-01-09 DIAGNOSIS — E119 Type 2 diabetes mellitus without complications: Secondary | ICD-10-CM

## 2020-01-09 DIAGNOSIS — I10 Essential (primary) hypertension: Secondary | ICD-10-CM | POA: Diagnosis present

## 2020-01-09 DIAGNOSIS — E538 Deficiency of other specified B group vitamins: Secondary | ICD-10-CM | POA: Diagnosis present

## 2020-01-09 DIAGNOSIS — R531 Weakness: Secondary | ICD-10-CM | POA: Diagnosis not present

## 2020-01-09 DIAGNOSIS — Z9119 Patient's noncompliance with other medical treatment and regimen: Secondary | ICD-10-CM | POA: Diagnosis not present

## 2020-01-09 DIAGNOSIS — R609 Edema, unspecified: Secondary | ICD-10-CM

## 2020-01-09 DIAGNOSIS — Z20822 Contact with and (suspected) exposure to covid-19: Secondary | ICD-10-CM | POA: Diagnosis present

## 2020-01-09 DIAGNOSIS — E785 Hyperlipidemia, unspecified: Secondary | ICD-10-CM | POA: Diagnosis present

## 2020-01-09 DIAGNOSIS — F419 Anxiety disorder, unspecified: Secondary | ICD-10-CM | POA: Diagnosis present

## 2020-01-09 DIAGNOSIS — G9341 Metabolic encephalopathy: Secondary | ICD-10-CM | POA: Diagnosis present

## 2020-01-09 DIAGNOSIS — I5033 Acute on chronic diastolic (congestive) heart failure: Secondary | ICD-10-CM | POA: Diagnosis present

## 2020-01-09 DIAGNOSIS — Z87442 Personal history of urinary calculi: Secondary | ICD-10-CM | POA: Diagnosis not present

## 2020-01-09 LAB — URINALYSIS, COMPLETE (UACMP) WITH MICROSCOPIC
Bilirubin Urine: NEGATIVE
Glucose, UA: NEGATIVE mg/dL
Hgb urine dipstick: NEGATIVE
Ketones, ur: NEGATIVE mg/dL
Nitrite: POSITIVE — AB
Protein, ur: 30 mg/dL — AB
Specific Gravity, Urine: 1.025 (ref 1.005–1.030)
Squamous Epithelial / HPF: NONE SEEN (ref 0–5)
WBC, UA: >50 WBC/hpf — ABNORMAL HIGH (ref 0–5)
pH: 5 (ref 5.0–8.0)

## 2020-01-09 LAB — SARS CORONAVIRUS 2 BY RT PCR (HOSPITAL ORDER, PERFORMED IN ~~LOC~~ HOSPITAL LAB): SARS Coronavirus 2: NEGATIVE

## 2020-01-09 MED ORDER — VITAMIN D 25 MCG (1000 UNIT) PO TABS
2000.0000 [IU] | ORAL_TABLET | Freq: Every day | ORAL | Status: DC
  Administered 2020-01-09 – 2020-01-11 (×2): 2000 [IU] via ORAL
  Filled 2020-01-09 (×3): qty 2

## 2020-01-09 MED ORDER — ZOLPIDEM TARTRATE 5 MG PO TABS
5.0000 mg | ORAL_TABLET | Freq: Every day | ORAL | Status: DC
  Administered 2020-01-09 – 2020-01-10 (×2): 5 mg via ORAL
  Filled 2020-01-09 (×2): qty 1

## 2020-01-09 MED ORDER — FINASTERIDE 5 MG PO TABS
5.0000 mg | ORAL_TABLET | Freq: Every day | ORAL | Status: DC
  Administered 2020-01-09 – 2020-01-10 (×2): 5 mg via ORAL
  Filled 2020-01-09 (×2): qty 1

## 2020-01-09 MED ORDER — ACETAMINOPHEN 325 MG PO TABS: 650.0000 mg | ORAL_TABLET | Freq: Four times a day (QID) | ORAL | Status: DC | PRN

## 2020-01-09 MED ORDER — CLONAZEPAM 0.5 MG PO TABS: 0.2500 mg | ORAL_TABLET | ORAL | Status: DC

## 2020-01-09 MED ORDER — CRANBERRY 250 MG PO TABS: 250.0000 mg | ORAL_TABLET | Freq: Every day | ORAL | Status: DC

## 2020-01-09 MED ORDER — PIPERACILLIN-TAZOBACTAM 3.375 G IVPB 30 MIN
3.3750 g | Freq: Once | INTRAVENOUS | Status: AC
  Administered 2020-01-09: 3.375 g via INTRAVENOUS
  Filled 2020-01-09: qty 50

## 2020-01-09 MED ORDER — ALLOPURINOL 300 MG PO TABS
300.0000 mg | ORAL_TABLET | Freq: Every day | ORAL | Status: DC
  Administered 2020-01-09 – 2020-01-10 (×2): 300 mg via ORAL
  Filled 2020-01-09 (×3): qty 1

## 2020-01-09 MED ORDER — PIPERACILLIN-TAZOBACTAM 3.375 G IVPB 30 MIN: 3.3750 g | Freq: Four times a day (QID) | INTRAVENOUS | Status: DC

## 2020-01-09 MED ORDER — NYSTATIN 100000 UNIT/GM EX POWD
Freq: Two times a day (BID) | CUTANEOUS | Status: DC
  Administered 2020-01-09: 1 via TOPICAL
  Filled 2020-01-09: qty 15

## 2020-01-09 MED ORDER — ONDANSETRON HCL 4 MG PO TABS: 4.0000 mg | ORAL_TABLET | Freq: Four times a day (QID) | ORAL | Status: DC | PRN

## 2020-01-09 MED ORDER — PROPRANOLOL HCL 20 MG PO TABS
40.0000 mg | ORAL_TABLET | Freq: Two times a day (BID) | ORAL | Status: DC
  Administered 2020-01-09 – 2020-01-11 (×4): 40 mg via ORAL
  Filled 2020-01-09 (×4): qty 2

## 2020-01-09 MED ORDER — PRIMIDONE 50 MG PO TABS: 50.0000 mg | ORAL_TABLET | ORAL | Status: DC

## 2020-01-09 MED ORDER — SODIUM CHLORIDE 0.9 % IV SOLN: INTRAVENOUS | Status: DC

## 2020-01-09 MED ORDER — SIMVASTATIN 20 MG PO TABS
20.0000 mg | ORAL_TABLET | Freq: Every evening | ORAL | Status: DC
  Administered 2020-01-09 – 2020-01-10 (×2): 20 mg via ORAL
  Filled 2020-01-09 (×2): qty 1

## 2020-01-09 MED ORDER — MAGNESIUM HYDROXIDE 400 MG/5ML PO SUSP: 30.0000 mL | Freq: Every day | ORAL | Status: DC | PRN

## 2020-01-09 MED ORDER — CLONAZEPAM 0.5 MG PO TABS
0.2500 mg | ORAL_TABLET | Freq: Two times a day (BID) | ORAL | Status: DC
  Administered 2020-01-09 – 2020-01-11 (×4): 0.25 mg via ORAL
  Filled 2020-01-09 (×4): qty 1

## 2020-01-09 MED ORDER — PRIMIDONE 50 MG PO TABS
50.0000 mg | ORAL_TABLET | Freq: Every day | ORAL | Status: DC
  Administered 2020-01-09 – 2020-01-10 (×2): 50 mg via ORAL
  Filled 2020-01-09 (×3): qty 1

## 2020-01-09 MED ORDER — CYCLOBENZAPRINE HCL 10 MG PO TABS
10.0000 mg | ORAL_TABLET | Freq: Three times a day (TID) | ORAL | Status: DC | PRN
  Administered 2020-01-09: 10 mg via ORAL
  Filled 2020-01-09: qty 1

## 2020-01-09 MED ORDER — BRIMONIDINE TARTRATE 0.2 % OP SOLN
1.0000 [drp] | Freq: Two times a day (BID) | OPHTHALMIC | Status: DC
  Administered 2020-01-09 – 2020-01-11 (×5): 1 [drp] via OPHTHALMIC
  Filled 2020-01-09: qty 5

## 2020-01-09 MED ORDER — SODIUM CHLORIDE 0.9 % IV SOLN
1.0000 g | Freq: Once | INTRAVENOUS | Status: AC
  Administered 2020-01-09: 1 g via INTRAVENOUS
  Filled 2020-01-09: qty 10

## 2020-01-09 MED ORDER — CLONAZEPAM 0.5 MG PO TABS
0.5000 mg | ORAL_TABLET | Freq: Every day | ORAL | Status: DC
  Administered 2020-01-09 – 2020-01-10 (×2): 0.5 mg via ORAL
  Filled 2020-01-09 (×2): qty 1

## 2020-01-09 MED ORDER — PRIMIDONE 50 MG PO TABS
75.0000 mg | ORAL_TABLET | Freq: Every morning | ORAL | Status: DC
  Administered 2020-01-09 – 2020-01-11 (×3): 75 mg via ORAL
  Filled 2020-01-09 (×3): qty 1.5

## 2020-01-09 MED ORDER — FERROUS SULFATE 325 (65 FE) MG PO TABS
325.0000 mg | ORAL_TABLET | ORAL | Status: DC
  Administered 2020-01-11: 325 mg via ORAL
  Filled 2020-01-09 (×2): qty 1

## 2020-01-09 MED ORDER — VITAMIN B-12 1000 MCG PO TABS
2500.0000 ug | ORAL_TABLET | Freq: Every day | ORAL | Status: DC
  Administered 2020-01-09 – 2020-01-11 (×2): 2500 ug via ORAL
  Filled 2020-01-09: qty 2.5
  Filled 2020-01-09 (×3): qty 3

## 2020-01-09 MED ORDER — GABAPENTIN 300 MG PO CAPS
300.0000 mg | ORAL_CAPSULE | Freq: Two times a day (BID) | ORAL | Status: DC
  Administered 2020-01-09 – 2020-01-11 (×5): 300 mg via ORAL
  Filled 2020-01-09 (×5): qty 1

## 2020-01-09 MED ORDER — LATANOPROST 0.005 % OP SOLN
1.0000 [drp] | Freq: Every day | OPHTHALMIC | Status: DC
  Administered 2020-01-10: 1 [drp] via OPHTHALMIC
  Filled 2020-01-09 (×2): qty 2.5

## 2020-01-09 MED ORDER — MIRTAZAPINE 15 MG PO TABS
45.0000 mg | ORAL_TABLET | Freq: Every day | ORAL | Status: DC
  Administered 2020-01-09 – 2020-01-10 (×2): 45 mg via ORAL
  Filled 2020-01-09 (×2): qty 3

## 2020-01-09 MED ORDER — TAMSULOSIN HCL 0.4 MG PO CAPS
0.4000 mg | ORAL_CAPSULE | Freq: Every day | ORAL | Status: DC
  Administered 2020-01-09 – 2020-01-10 (×2): 0.4 mg via ORAL
  Filled 2020-01-09 (×2): qty 1

## 2020-01-09 MED ORDER — TRAZODONE HCL 50 MG PO TABS: 25.0000 mg | ORAL_TABLET | Freq: Every evening | ORAL | Status: DC | PRN

## 2020-01-09 MED ORDER — PIPERACILLIN-TAZOBACTAM 3.375 G IVPB
3.3750 g | Freq: Three times a day (TID) | INTRAVENOUS | Status: DC
  Administered 2020-01-09 – 2020-01-11 (×6): 3.375 g via INTRAVENOUS
  Filled 2020-01-09 (×6): qty 50

## 2020-01-09 MED ORDER — ONDANSETRON HCL 4 MG/2ML IJ SOLN: 4.0000 mg | Freq: Four times a day (QID) | INTRAMUSCULAR | Status: DC | PRN

## 2020-01-09 MED ORDER — SODIUM CHLORIDE 0.9 % IV SOLN
INTRAVENOUS | Status: DC | PRN
  Administered 2020-01-09 – 2020-01-11 (×2): 250 mL via INTRAVENOUS

## 2020-01-09 MED ORDER — FUROSEMIDE 10 MG/ML IJ SOLN
20.0000 mg | Freq: Every day | INTRAMUSCULAR | Status: DC
  Administered 2020-01-09: 20 mg via INTRAVENOUS
  Filled 2020-01-09 (×2): qty 4

## 2020-01-09 MED ORDER — PANTOPRAZOLE SODIUM 40 MG PO TBEC
40.0000 mg | DELAYED_RELEASE_TABLET | Freq: Every day | ORAL | Status: DC
  Administered 2020-01-09 – 2020-01-11 (×3): 40 mg via ORAL
  Filled 2020-01-09 (×3): qty 1

## 2020-01-09 MED ORDER — LORATADINE 10 MG PO TABS: 10.0000 mg | ORAL_TABLET | Freq: Every day | ORAL | Status: DC | PRN

## 2020-01-09 MED ORDER — ENOXAPARIN SODIUM 40 MG/0.4ML ~~LOC~~ SOLN
40.0000 mg | SUBCUTANEOUS | Status: DC
  Administered 2020-01-09 – 2020-01-11 (×3): 40 mg via SUBCUTANEOUS
  Filled 2020-01-09 (×3): qty 0.4

## 2020-01-09 MED ORDER — ACETAMINOPHEN 650 MG RE SUPP: 650.0000 mg | Freq: Four times a day (QID) | RECTAL | Status: DC | PRN

## 2020-01-09 NOTE — Progress Notes (Signed)
Please see H&P for full details. 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 U/A suggestive of UTI and patient admitted for empiric abx ( zosyn) for UTI /metabolic encephalopathy. F/U urine cx and adjust meds as needed.  Patient noted to have 3+ pitting edema in lower extremities and reports chronic leg swellings --he doesn't like to use support stockings , barely uses lasix prn (as he has trouble urinating with chronic scrotal swelling, retracted penis)  At home but tries to keep his legs elevated. Also noted to abdominal creases with rash/bleeding--?candidiasis. Treat with IV lasix ( can place indwelling foley if absolutely needed but would like to avoid with UTI, as discussed with bedside nurse) and nystatin powder. Ted hose ordered. D/C IVF

## 2020-01-09 NOTE — ED Notes (Signed)
This RN attempted to call report and informed that pt's nurse is currently on the phone. Will attempted at a later time.

## 2020-01-09 NOTE — H&P (Signed)
[facility] [place] NAME: Curtis Alexander    MR#:  [id number]  DATE OF BIRTH:  08-22-1945  DATE OF ADMISSION:  01/09/2020  PRIMARY CARE PHYSICIAN: [name]   REQUESTING/REFERRING PHYSICIAN: Marjean Donna, MD  CHIEF COMPLAINT:   Chief Complaint  Patient presents with  . Weakness    HISTORY OF PRESENT ILLNESS:  Curtis Alexander  is a 74 y.o. Caucasian male with a known history of type 2 diabetes mellitus, hypertension, sleep apnea, history of UTI with resistant bacteria, who presented to the emergency room with acute onset of generalized weakness which has been progressing over the last 2 to 3 weeks.  He could not get 2 steps today per his report.  He denied any abdominal pain or dysuria, oliguria or hematuria urgency or frequency or flank pain.  He stated that when he had this UTI in the past he had no significant urinary symptoms.  He denied any fever or chills.  No nausea or vomiting.  No cough or dyspnea.  No chest pain or palpitations.  Upon presentation to the emergency room, vital signs were within normal.  Labs revealed a BUN of 25 with otherwise normal BMP.  High-sensitivity troponin was 12 and CBC was unremarkable.  UA was strongly positive for UTI.  Urine culture was sent.  The patient was initially given IV Rocephin and later IV Zosyn.  He will be admitted to a medical  bed for further evaluation and management. PAST MEDICAL HISTORY:   Past Medical History:  Diagnosis Date  . Arthritis   . Cervical spinal stenosis   . Diabetes mellitus without complication ([place])   . Essential tremor   . GERD (gastroesophageal reflux disease)   . History of hiatal hernia    fixed with gastric bypass surgery  . History of kidney stones   . Hypertension   . Hypoglycemia   . Non-traumatic rupture of right patellar tendon 03/25/2019  . Sleep apnea    had gastric bypass and no longer uses cpap machine  . Spinal stenosis, lumbar     PAST SURGICAL  HISTORY:   Past Surgical History:  Procedure Laterality Date  . FRACTURE SURGERY Left    age 61  . GASTRIC BYPASS     fixed hiatal hernia  . KNEE ARTHROPLASTY Right 03/05/2019   Procedure: COMPUTER ASSISTED TOTAL KNEE ARTHROPLASTY;  Surgeon: Dereck Leep, MD;  Location: [facility] ORS;  Service: Orthopedics;  Laterality: Right;  . PATELLAR TENDON REPAIR Right 03/28/2019   Procedure: PATELLA TENDON REPAIR;  Surgeon: Dereck Leep, MD;  Location: [facility] ORS;  Service: Orthopedics;  Laterality: Right;  . SKIN SURGERY     after gastric bypass and pt became septic and had wound vac placed  . VENA CAVA FILTER PLACEMENT     WAS PLACED DURING PTS GASTRIC BYPASS SURGERY PER PT    SOCIAL HISTORY:   Social History   Tobacco Use  . Smoking status: Never Smoker  . Smokeless tobacco: Never Used  Substance Use Topics  . Alcohol use: Yes    Comment: occ 1 glass per week    FAMILY HISTORY:  No family history on file.  No pertinent familial diseases.  DRUG ALLERGIES:  No Known Allergies  REVIEW OF SYSTEMS:   ROS As per history of present illness. All pertinent systems were reviewed above. Constitutional,  HEENT, cardiovascular, respiratory, GI, GU, musculoskeletal, neuro, psychiatric, endocrine,  integumentary and hematologic systems were reviewed and are otherwise  negative/unremarkable  except for positive findings mentioned above in the HPI.   MEDICATIONS AT HOME:   Prior to Admission medications   Medication Sig Start Date End Date Taking? Authorizing Provider  allopurinol (ZYLOPRIM) 300 MG tablet Take 300 mg by mouth daily with lunch.   Yes [provider]  brimonidine (ALPHAGAN) 0.2 % ophthalmic solution Place 1 drop into the right eye 2 (two) times daily.   Yes [provider]  Cholecalciferol (VITAMIN D) 50 MCG (2000 UT) CAPS Take 2,000 Units by mouth daily.   Yes [provider]  clonazePAM (KLONOPIN) 0.5 MG tablet Take 0.25 mg by mouth See admin  instructions. Take 0.25 in the morning, 0.25 at lunch, 0.5 mg at bedtime   Yes [provider]  Cranberry 250 MG TABS Take 250 mg by mouth daily.   Yes [provider]  Cyanocobalamin (B-12) 2500 MCG TABS Take 2,500 mcg by mouth daily.   Yes [provider]  cyclobenzaprine (FLEXERIL) 10 MG tablet Take 10 mg by mouth in the morning and at bedtime.    Yes [provider]  ferrous sulfate 325 (65 FE) MG tablet Take 1 tablet (325 mg total) by mouth 2 (two) times daily with a meal. Patient taking differently: Take 325 mg by mouth every other day.  03/09/19  Yes [name], PA-C  finasteride (PROSCAR) 5 MG tablet Take 5 mg by mouth daily with lunch.   Yes [provider]  gabapentin (NEURONTIN) 300 MG capsule Take 300 mg by mouth 2 (two) times daily. 12/29/19  Yes [provider]  latanoprost (XALATAN) 0.005 % ophthalmic solution Place 1 drop into the right eye at bedtime.   Yes [provider]  loratadine (CLARITIN) 10 MG tablet Take 10 mg by mouth daily as needed for allergies.   Yes [provider]  mirtazapine (REMERON) 45 MG tablet Take 45 mg by mouth at bedtime.   Yes [provider]  omeprazole (PRILOSEC) 40 MG capsule Take 40 mg by mouth daily with lunch.   Yes [provider]  primidone (MYSOLINE) 50 MG tablet Take 50-75 mg by mouth See admin instructions. Take 1 tablets (75mg ) by mouth every morning and take 1 tablet (50mg ) by mouth every afternoon   Yes [provider]  propranolol (INDERAL) 40 MG tablet Take 40 mg by mouth 2 (two) times daily.    Yes [provider]  simvastatin (ZOCOR) 20 MG tablet Take 1 tablet by mouth every evening.   Yes [provider]  tamsulosin (FLOMAX) 0.4 MG CAPS capsule Take 0.4 mg by mouth at bedtime.   Yes [provider]  zolpidem (AMBIEN) 5 MG tablet Take 5 mg by mouth at bedtime.    Yes [provider]      VITAL SIGNS:    Blood pressure 106/63, pulse 85, temperature 99.5 F (37.5 C), temperature source Oral, resp. rate 18, SpO2 93 %.  PHYSICAL EXAMINATION:  Physical Exam  GENERAL:  74 y.o.-year-old Caucasian male patient lying in the bed with no acute distress.  EYES: Pupils equal, round, reactive to light and accommodation. No scleral icterus. Extraocular muscles intact.  HEENT: Head atraumatic, normocephalic. Oropharynx and nasopharynx clear.  NECK:  Supple, no jugular venous distention. No thyroid enlargement, no tenderness.  LUNGS: Normal breath sounds bilaterally, no wheezing, rales,rhonchi or crepitation. No use of accessory muscles of respiration.  CARDIOVASCULAR: Regular rate and rhythm, S1, S2 normal. No murmurs, rubs, or gallops.  ABDOMEN: Soft, nondistended, nontender. Bowel sounds present.  No organomegaly or mass.  EXTREMITIES: No pedal edema, cyanosis, or clubbing.  NEUROLOGIC: Cranial nerves II through XII are intact. Muscle strength 5/5 in all extremities. Sensation intact. Gait not checked.  PSYCHIATRIC: The patient is alert and oriented x 3.  Normal affect and good eye contact. SKIN: No obvious rash, lesion, or ulcer.   LABORATORY PANEL:   CBC Recent Labs  Lab 01/08/20 1819  WBC 5.7  HGB 12.9*  HCT 42.3  PLT 138*   ------------------------------------------------------------------------------------------------------------------  Chemistries  Recent Labs  Lab 01/08/20 1819  NA 142  K 3.7  CL 104  CO2 30  GLUCOSE 150*  BUN 25*  CREATININE 0.86  CALCIUM 8.2*   ------------------------------------------------------------------------------------------------------------------  Cardiac Enzymes No results for input(s): TROPONINI in the last 168 hours. ------------------------------------------------------------------------------------------------------------------  RADIOLOGY:  DG Chest Portable 1 View  Result Date: 01/09/2020 CLINICAL DATA:  74 year old male with  persistent cough and increased weakness following 1 month of rehab for knee surgery. EXAM: PORTABLE CHEST 1 VIEW COMPARISON:  Portable chest 03/06/2019. FINDINGS: Portable AP semi upright view at 0420 hours. Stable lung volumes from last year with mild chronic elevation of the right hemidiaphragm. Stable cardiac size at the upper limits of normal. Other mediastinal contours are within normal limits. Allowing for portable technique the lungs are clear. No pneumothorax or pleural effusion. Paucity of bowel gas in the upper abdomen. No acute osseous abnormality identified. IMPRESSION: No acute cardiopulmonary abnormality. Electronically Signed   By: Genevie Ann M.D.   On: 01/09/2020 04:46      IMPRESSION AND PLAN:   1.  UTI with subsequent generalized weakness. -The patient will be admitted to a medical bed. -Continue antibiotic therapy with IV Zosyn and follow urine culture and sensitivity. -Physical therapy consult will be obtained.  2.  Gout. -We will continue allopurinol.  3.  Vitamin B12 deficiency. -We will continue vitamin B12.  4.  Anxiety. -We will continue Klonopin.  5.  Glaucoma. -We will continue his ophthalmic gtt.'s.  6.  Dyslipidemia. -We will continue statin therapy.  7.  BPH. -We will continue p.o. Flomax.  8.  DVT prophylaxis. -Subcutaneous Lovenox.    All the records are reviewed and case discussed with ED provider. The plan of care was discussed in details with the patient (and family). I answered all questions. The patient agreed to proceed with the above mentioned plan. Further management will depend upon hospital course.   CODE STATUS: Full code  Status is: Inpatient  Remains inpatient appropriate because:Ongoing diagnostic testing needed not appropriate for outpatient work up, Unsafe d/c plan, IV treatments appropriate due to intensity of illness or inability to take PO and Inpatient level of care appropriate due to severity of illness   Dispo: The  patient is from: Home              Anticipated d/c is to: Home              Anticipated d/c date is: 2 days              Patient currently is not medically stable to d/c.   TOTAL TIME TAKING CARE OF THIS PATIENT: 55 minutes.    Christel Mormon M.D on 01/09/2020 at 8:33 AM  [facility]   From 7 PM-7 AM, contact night-coverage [email or web address]  CC: Primary care physician; [name]   Note: This dictation was prepared with Dragon dictation along with smaller phrase technology. Any transcriptional typo errors that result from this process are unintentional.

## 2020-01-09 NOTE — ED Notes (Signed)
Report received from Rebekah RN. Patient care assumed. Patient/RN introduction complete. Will continue to monitor.  

## 2020-01-09 NOTE — ED Notes (Signed)
Pt states he has a UTI, has tried oral antibiotics and cranberry tablets with no relief, states urine has a pink tint now.  Pt states he is getting weaker every day, and he fell today.

## 2020-01-09 NOTE — ED Provider Notes (Signed)
[facility] Emergency Department Provider Note  ____________________________________________   First MD Initiated Contact with Patient 01/09/20 0300     (approximate)  I have reviewed the triage vital signs and the nursing notes.   HISTORY  Chief Complaint Weakness   HPI Curtis Alexander is a 74 y.o. male with below list of previous medical conditions including urinary tract infection with ESBL February 23, 2019 presents to the emergency department with dysuria, foul-smelling urine and progressive generalized weakness x1 month.  Patient states that he is unable to take 2 steps while after he left rehab he was able to go 30 feet.  Patient denies any headache no focal weakness no visual changes.  Patient denies any nausea or vomiting.  Has subjective fevers at home temperature 99.5 on arrival to the emergency department.  Patient denies any abdominal pain.        Past Medical History:  Diagnosis Date  . Arthritis   . Cervical spinal stenosis   . Diabetes mellitus without complication ([place])   . Essential tremor   . GERD (gastroesophageal reflux disease)   . History of hiatal hernia    fixed with gastric bypass surgery  . History of kidney stones   . Hypertension   . Hypoglycemia   . Non-traumatic rupture of right patellar tendon 03/25/2019  . Sleep apnea    had gastric bypass and no longer uses cpap machine  . Spinal stenosis, lumbar     Patient Active Problem List   Diagnosis Date Noted  . Non-traumatic rupture of right patellar tendon 03/25/2019  . 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 Essential tremor 03/04/2019  . Gout 03/04/2019  . Intestinal malabsorption 09/29/2018  . 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 Lumbar herniated disc 12/17/2012  . Nephrolithiasis 06/11/2012  . Anxiety disorder 03/12/2012  . Status post abdominoplasty  04/30/2007  . Absence of bladder continence 11/04/2003  . Back pain 11/04/2003  . Depression 11/04/2003  . Diabetes mellitus, type II ([place]) 11/04/2003  . Hypertension 11/04/2003  . Insomnia 11/04/2003  . Morbid obesity ([place]) 11/04/2003  . Orthopnea 11/04/2003  . Peptic ulcer disease 11/04/2003  . Sleep apnea 11/04/2003  . Venous insufficiency 11/04/2003    Past Surgical History:  Procedure Laterality Date  . FRACTURE SURGERY Left    age 52  . GASTRIC BYPASS     fixed hiatal hernia  . KNEE ARTHROPLASTY Right 03/05/2019   Procedure: COMPUTER ASSISTED TOTAL KNEE ARTHROPLASTY;  Surgeon: Dereck Leep, MD;  Location: [facility] ORS;  Service: Orthopedics;  Laterality: Right;  . PATELLAR TENDON REPAIR Right 03/28/2019   Procedure: PATELLA TENDON REPAIR;  Surgeon: Dereck Leep, MD;  Location: [facility] ORS;  Service: Orthopedics;  Laterality: Right;  . SKIN SURGERY     after gastric bypass and pt became septic and had wound vac placed  . VENA CAVA FILTER PLACEMENT     WAS PLACED DURING PTS GASTRIC BYPASS SURGERY PER PT    Prior to Admission medications   Medication Sig Start Date End Date Taking? Authorizing Provider  allopurinol (ZYLOPRIM) 300 MG tablet Take 300 mg by mouth daily with lunch.    [provider]  Ascorbic Acid (VITAMIN C) 1000 MG tablet Take 1,000 mg by mouth daily.    [provider]  brimonidine (ALPHAGAN) 0.2 % ophthalmic solution Place 1 drop into the right eye 2 (two) times daily.  [provider]  celecoxib (CELEBREX) 200 MG capsule Take 1 capsule (200 mg total) by mouth 2 (two) times daily. 03/09/19   [name], PA-C  Cholecalciferol (VITAMIN D) 50 MCG (2000 UT) CAPS Take 2,000 Units by mouth daily.    [provider]  clonazePAM (KLONOPIN) 0.5 MG tablet Take 0.25 mg by mouth 2 (two) times daily.     [provider]  Cyanocobalamin (B-12) 2500 MCG TABS Take 2,500 mcg by mouth daily.    [provider]    cyclobenzaprine (FLEXERIL) 10 MG tablet Take 10 mg by mouth 3 (three) times daily.    [provider]  enoxaparin (LOVENOX) 40 MG/0.4ML injection Inject 0.4 mLs (40 mg total) into the skin daily for 14 doses. 03/09/19 03/25/19  [name], PA-C  erythromycin ophthalmic ointment Place 1 application into the left eye at bedtime as needed.     [provider]  ferrous sulfate 325 (65 FE) MG tablet Take 1 tablet (325 mg total) by mouth 2 (two) times daily with a meal. 03/09/19   [name], PA-C  finasteride (PROSCAR) 5 MG tablet Take 5 mg by mouth daily with lunch.    [provider]  latanoprost (XALATAN) 0.005 % ophthalmic solution Place 1 drop into the right eye at bedtime.    [provider]  loratadine (CLARITIN) 10 MG tablet Take 10 mg by mouth daily as needed for allergies.    [provider]  metFORMIN (GLUCOPHAGE) 500 MG tablet Take 250 mg by mouth 2 (two) times daily with a meal.    [provider]  mirtazapine (REMERON) 45 MG tablet Take 45 mg by mouth at bedtime.    [provider]  omeprazole (PRILOSEC) 40 MG capsule Take 40 mg by mouth daily with lunch.    [provider]  oxyCODONE (OXY IR/ROXICODONE) 5 MG immediate release tablet Take 1 tablet (5 mg total) by mouth every 4 (four) hours as needed for moderate pain (pain score 4-6). 03/09/19   [name], PA-C  primidone (MYSOLINE) 50 MG tablet Take 50-75 mg by mouth See admin instructions. Take 1 tablets (75mg ) by mouth every morning and take 1 tablet (50mg ) by mouth every afternoon    [provider]  propranolol (INDERAL) 40 MG tablet Take 40 mg by mouth 2 (two) times daily.     [provider]  simvastatin (ZOCOR) 20 MG tablet Take 1 tablet by mouth every evening.    [provider]  tamsulosin (FLOMAX) 0.4 MG CAPS capsule Take 0.4 mg by mouth at bedtime.    [provider]  topiramate (TOPAMAX) 50 MG tablet Take 50-100 mg by  mouth See admin instructions. Take 2 tablets (100mg ) by mouth every morning at 9AM and take 1 tablet (50mg ) by mouth daily at lunch    [provider]  traMADol (ULTRAM) 50 MG tablet Take 1-2 tablets (50-100 mg total) by mouth every 4 (four) hours as needed for moderate pain. 03/09/19   [name], PA-C  zolpidem (AMBIEN) 5 MG tablet Take 5 mg by mouth at bedtime as needed for sleep.     [provider]    Allergies Patient has no known allergies.  No family history on file.  Social History Social History   Tobacco Use  . Smoking status: Never Smoker  . Smokeless tobacco: Never Used  Vaping Use  . Vaping Use: Never used  Substance Use Topics  . Alcohol use: Yes    Comment: occ  1 glass per week  . Drug use: Never    Review of Systems Constitutional: No fever/chills Eyes: No visual changes. ENT: No sore throat. Cardiovascular: Denies chest pain. Respiratory: Denies shortness of breath. Gastrointestinal: No abdominal pain.  No nausea, no vomiting.  No diarrhea.  No constipation. Genitourinary: Positive for dysuria and foul-smelling urine. Musculoskeletal: Negative for neck pain.  Negative for back pain. Integumentary: Negative for rash. Neurological: Negative for headaches, focal weakness or numbness.   ____________________________________________   PHYSICAL EXAM:  VITAL SIGNS: ED Triage Vitals  Enc Vitals Group     BP 01/08/20 1851 109/60     Pulse Rate 01/08/20 1851 85     Resp 01/08/20 1851 16     Temp 01/08/20 1851 99.5 F (37.5 C)     Temp Source 01/08/20 1851 Oral     SpO2 01/08/20 1851 94 %     Weight --      Height --      Head Circumference --      Peak Flow --      Pain Score 01/09/20 0300 0     Pain Loc --      Pain Edu? --      Excl. in [place]? --     Constitutional: Alert and oriented.  Eyes: Conjunctivae are normal.  Head: Atraumatic. Mouth/Throat: Patient is wearing a mask. Neck: No stridor.  No meningeal signs.     Cardiovascular: Normal rate, regular rhythm. Good peripheral circulation. Grossly normal heart sounds. Respiratory: Normal respiratory effort.  No retractions. Gastrointestinal: Soft and nontender. No distention.  Musculoskeletal: No lower extremity tenderness nor edema. No gross deformities of extremities. Neurologic:  Normal speech and language. No gross focal neurologic deficits are appreciated.  Skin:  Skin is warm, dry and intact. Psychiatric: Mood and affect are normal. Speech and behavior are normal.  ____________________________________________   LABS (all labs ordered are listed, but only abnormal results are displayed)  Labs Reviewed  BASIC METABOLIC PANEL - Abnormal; Notable for the following components:      Result Value   Glucose, Bld 150 (*)    BUN 25 (*)    Calcium 8.2 (*)    All other components within normal limits  CBC - Abnormal; Notable for the following components:   Hemoglobin 12.9 (*)    Platelets 138 (*)    All other components within normal limits  URINALYSIS, COMPLETE (UACMP) WITH MICROSCOPIC - Abnormal; Notable for the following components:   Color, Urine AMBER (*)    APPearance CLOUDY (*)    Protein, ur 30 (*)    Nitrite POSITIVE (*)    Leukocytes,Ua MODERATE (*)    WBC, UA >50 (*)    Bacteria, UA MANY (*)    All other components within normal limits  URINE CULTURE  SARS CORONAVIRUS 2 BY RT PCR (HOSPITAL ORDER, [place] [facility])  TROPONIN I (HIGH SENSITIVITY)   ____________________________________________  EKG  ED ECG REPORT I, [place] Shanica Castellanos, the attending physician, personally viewed and interpreted this ECG.   Date: 01/08/2020  EKG Time: 7:01 PM  Rate: 85  Rhythm: Sinus rhythm with right bundle branch block and occasional PVC  Axis: Normal  Intervals: Normal  ST&T Change: None    Procedures   ____________________________________________   INITIAL IMPRESSION / MDM / ASSESSMENT AND PLAN / ED COURSE  As  part of my medical decision making, I reviewed the following data within the electronic MEDICAL RECORD NUMBER  74 year old male presented  with above-stated history and physical exam a differential diagnosis including but not limited to urinary tract infection, electrolyte abnormality.  Lab data consistent with a UTI.  Given history of ESBL patient given Zosyn 3.375 mg.  Patient discussed with Dr. Richardean Sale for hospital admission for further evaluation and management.  ____________________________________________  FINAL CLINICAL IMPRESSION(S) / ED DIAGNOSES  Final diagnoses:  Generalized weakness  Acute cystitis without hematuria     MEDICATIONS GIVEN DURING THIS VISIT:  Medications  cefTRIAXone (ROCEPHIN) 1 g in sodium chloride 0.9 % 100 mL IVPB (1 g Intravenous New Bag/Given 01/09/20 0336)     ED Discharge Orders    None      *Please note:  Catherine Oak was evaluated in Emergency Department on 01/09/2020 for the symptoms described in the history of present illness. He was evaluated in the context of the global COVID-19 pandemic, which necessitated consideration that the patient might be at risk for infection with the SARS-CoV-2 virus that causes COVID-19. Institutional protocols and algorithms that pertain to the evaluation of patients at risk for COVID-19 are in a state of rapid change based on information released by regulatory bodies including the CDC and federal and state organizations. These policies and algorithms were followed during the patient's care in the ED.  Some ED evaluations and interventions may be delayed as a result of limited staffing during the pandemic.*  Note:  This document was prepared using Dragon voice recognition software and may include unintentional dictation errors.   Gregor Hams, MD 01/09/20 [phone]

## 2020-01-09 NOTE — ED Notes (Signed)
Pt pending admission

## 2020-01-10 ENCOUNTER — Inpatient Hospital Stay: Payer: Medicare Other

## 2020-01-10 DIAGNOSIS — N3000 Acute cystitis without hematuria: Principal | ICD-10-CM

## 2020-01-10 LAB — HEPATIC FUNCTION PANEL
ALT: 14 U/L (ref 0–44)
AST: 17 U/L (ref 15–41)
Albumin: 2.8 g/dL — ABNORMAL LOW (ref 3.5–5.0)
Alkaline Phosphatase: 69 U/L (ref 38–126)
Bilirubin, Direct: 0.1 mg/dL (ref 0.0–0.2)
Indirect Bilirubin: 0.4 mg/dL (ref 0.3–0.9)
Total Bilirubin: 0.5 mg/dL (ref 0.3–1.2)
Total Protein: 5.4 g/dL — ABNORMAL LOW (ref 6.5–8.1)

## 2020-01-10 LAB — URINE CULTURE

## 2020-01-10 LAB — CBC
HCT: 37.3 % — ABNORMAL LOW (ref 39.0–52.0)
Hemoglobin: 12 g/dL — ABNORMAL LOW (ref 13.0–17.0)
MCH: 30.4 pg (ref 26.0–34.0)
MCHC: 32.2 g/dL (ref 30.0–36.0)
MCV: 94.4 fL (ref 80.0–100.0)
Platelets: 120 10*3/uL — ABNORMAL LOW (ref 150–400)
RBC: 3.95 MIL/uL — ABNORMAL LOW (ref 4.22–5.81)
RDW: 14 % (ref 11.5–15.5)
WBC: 4.9 10*3/uL (ref 4.0–10.5)
nRBC: 0 % (ref 0.0–0.2)

## 2020-01-10 LAB — BASIC METABOLIC PANEL
Anion gap: 11 (ref 5–15)
BUN: 23 mg/dL (ref 8–23)
CO2: 33 mmol/L — ABNORMAL HIGH (ref 22–32)
Calcium: 8.2 mg/dL — ABNORMAL LOW (ref 8.9–10.3)
Chloride: 100 mmol/L (ref 98–111)
Creatinine, Ser: 0.63 mg/dL (ref 0.61–1.24)
GFR calc Af Amer: >60 mL/min (ref >60)
GFR calc non Af Amer: >60 mL/min (ref >60)
Glucose, Bld: 101 mg/dL — ABNORMAL HIGH (ref 70–99)
Potassium: 3.9 mmol/L (ref 3.5–5.1)
Sodium: 144 mmol/L (ref 135–145)

## 2020-01-10 NOTE — Progress Notes (Signed)
[facility]                                                                              Patient Demographics  Curtis Alexander, is a 74 y.o. male, DOB - Oct 03, 1945, EQA:[id number]  Admit date - 01/09/2020   Admitting Physician Christel Mormon, MD  Outpatient Primary MD for the patient is [name]  Outpatient specialists:   LOS - 1  days   Medical records reviewed and are as summarized below:    Chief Complaint  Patient presents with  . Weakness       Brief summary   Patient is a 74 year old male with known history of diabetes mellitus, hypertension, OSA, history of previous resistant UTI, presented to ED with acute onset of generalized weakness, progressing over the last 2 to 3 weeks PTA.  Denies any abdominal pain or dysuria, increased frequency or flank pain.  No fevers or chills. In ED, UA positive for UTI, urine culture was sent Normal BMET.  Patient was placed on IV Zosyn, admitted for further work-up   Assessment & Plan    Principal Problem: Acute metabolic encephalopathy, likely due to acute urinary tract infection: With a history of prior resistant UTI, presented with generalized weakness -Currently alert and oriented, appears to be improving, will start PT -Continue IV Zosyn, follow urine culture and sensitivities  Active Problems: Bilateral lower extremity edema -Patient noted to have 2-3+ pitting edema bilateral lower extremities, states has been worsening in the last 3 months, has chronic edema, on Lasix but has been noncompliant outpatient -Patient was seen by cardiology in 02/2019 prior to the knee surgery.  2D echo had shown EF> 55%, normal left and right ventricular systolic function, trivial MR, trivial TR -Possibly due to underlying diastolic CHF, hypoalbuminemia, venous insufficiency, rule out DVT due to sedentary lifestyle after the surgery and recurrent UTIs -Obtain venous Dopplers bilateral lower extremities, 2D echocardiogram, LFTs  to check albumin level -Patient was placed on IV Lasix, -1.6 L, patient now refuses to take any Lasix while inpatient due to frequent urination -He states that he will start taking the Lasix once he gets home after the Father's Day, so that Lasix does not interrupt his family time -Counseled patient strongly on low-sodium diet, compliance with Lasix, TED hoses.  He requested to be placed on regular diet, refused low-sodium diet.    Anxiety disorder -Continue Klonopin    Diabetes mellitus, type II ([place]), diet controlled -Continue monitoring CBGs, not on sliding scale insulin while inpatient    Hypertension -BP currently soft, hold off on metoprolol if BP less than 90, heart rate less than 50 -Patient has already refused Lasix  Gout Continue allopurinol  Dyslipidemia Continue statin  Glaucoma Continue ophthalmic eyedrops   Morbid obesity Counseled on diet and weight control Estimated body mass index is 42.6 kg/m as calculated from the following:   Height as of this encounter: 5\' 8"  (1.727 m).   Weight as of this encounter: 127.1 kg.   Generalized deconditioning Start PT OT today, will arrange home health if recommended  Code Status: Full CODE STATUS DVT Prophylaxis:  Lovenox Family Communication: Discussed  all imaging results, lab results, explained to the patient   Disposition Plan:     Status is: Inpatient  Remains inpatient appropriate because:IV treatments appropriate due to intensity of illness or inability to take PO, history of resistant UTI in the past, continue IV antibiotics, following urine culture and sensitivities   Dispo: The patient is from: Home              Anticipated d/c is to: Home              Anticipated d/c date is: 1 day              Patient currently is not medically stable to d/c. Awaiting urine culture and sensitivities, 2D echo, venous Dopplers      Time Spent in minutes 35 minutes  Procedures:  None  Consultants:    None  Antimicrobials:   Anti-infectives (From admission, onward)   Start     Dose/Rate Route Frequency Ordered Stop   01/09/20 1200  piperacillin-tazobactam (ZOSYN) IVPB 3.375 g     Discontinue     3.375 g 12.5 mL/hr over 240 Minutes Intravenous Every 8 hours 01/09/20 0613     01/09/20 0615  piperacillin-tazobactam (ZOSYN) IVPB 3.375 g  Status:  Discontinued        3.375 g 100 mL/hr over 30 Minutes Intravenous Every 6 hours 01/09/20 0602 01/09/20 0613   01/09/20 0345  piperacillin-tazobactam (ZOSYN) IVPB 3.375 g        3.375 g 100 mL/hr over 30 Minutes Intravenous  Once 01/09/20 0343 01/09/20 0517   01/09/20 0315  cefTRIAXone (ROCEPHIN) 1 g in sodium chloride 0.9 % 100 mL IVPB        1 g 200 mL/hr over 30 Minutes Intravenous  Once 01/09/20 0300 01/09/20 0414         Medications  Scheduled Meds: . allopurinol  300 mg Oral Q lunch  . brimonidine  1 drop Right Eye BID  . cholecalciferol  2,000 Units Oral Daily  . clonazePAM  0.25 mg Oral BID   And  . clonazePAM  0.5 mg Oral QHS  . enoxaparin (LOVENOX) injection  40 mg Subcutaneous Q24H  . ferrous sulfate  325 mg Oral QODAY  . finasteride  5 mg Oral Q lunch  . furosemide  20 mg Intravenous Daily  . gabapentin  300 mg Oral BID  . latanoprost  1 drop Right Eye QHS  . mirtazapine  45 mg Oral QHS  . nystatin   Topical BID  . pantoprazole  40 mg Oral Daily  . primidone  75 mg Oral q AM   And  . primidone  50 mg Oral Daily  . propranolol  40 mg Oral BID  . simvastatin  20 mg Oral QPM  . tamsulosin  0.4 mg Oral QHS  . vitamin B-12  2,500 mcg Oral Daily  . zolpidem  5 mg Oral QHS   Continuous Infusions: . sodium chloride 250 mL (01/09/20 1141)  . piperacillin-tazobactam (ZOSYN)  IV 3.375 g (01/10/20 0421)   PRN Meds:.sodium chloride, acetaminophen **OR** acetaminophen, cyclobenzaprine, loratadine, magnesium hydroxide, ondansetron **OR** ondansetron (ZOFRAN) IV, traZODone      Subjective:   Curtis Alexander was  seen and examined today. No acute complaints except wants regular diet, states will not take Lasix while inpatient. Patient denies dizziness, chest pain, shortness of breath, abdominal pain, N/V/D/C, new weakness, numbess, tingling. No acute events overnight. afebrile  Objective:   Vitals:   01/09/20 1500 01/09/20 2017  01/09/20 2358 01/10/20 0734  BP: 112/80 109/62 (!) 92/49 108/65  Pulse: (!) 59 63 66 68  Resp: 14 16 18 18   Temp: 98.4 F (36.9 C) 98.7 F (37.1 C) 98.2 F (36.8 C) 98.2 F (36.8 C)  TempSrc: Oral Oral Oral   SpO2: 99% 97% 93% 94%  Weight:      Height:        Intake/Output Summary (Last 24 hours) at 01/10/2020 1055 Last data filed at 01/10/2020 1017 Gross per 24 hour  Intake 700.85 ml  Output 2375 ml  Net -1674.15 ml     Wt Readings from Last 3 Encounters:  01/09/20 127.1 kg  03/28/19 121 kg  03/06/19 126.8 kg     Exam  General: Alert and oriented x 3, NAD  Cardiovascular: S1 S2 auscultated, no murmurs, RRR  Respiratory: Clear to auscultation bilaterally, no wheezing, rales or rhonchi  Gastrointestinal: Obese, soft, nontender, mild abdominal distention, + bowel sounds  Ext: 2+ pedal edema bilaterally, chronic venous stasis  Neuro: AAOx3, Cr N's II- XII. Strength 5/5 upper and lower extremities bilaterally  Musculoskeletal: No digital cyanosis, clubbing  Skin: No rashes  Psych: Normal affect and demeanor, alert and oriented x3    Data Reviewed:  I have personally reviewed following labs and imaging studies  Micro Results Recent Results (from the past 240 hour(s))  Urine culture     Status: Abnormal   Collection Time: 01/09/20  2:06 AM   Specimen: Urine, Clean Catch  Result Value Ref Range Status   Specimen Description   Final    URINE, CLEAN CATCH Performed at [facility], [address] [place], [place] [address]    Special Requests   Final    NONE Performed at [facility], [address], [place], [place]  [address]    Culture MULTIPLE SPECIES PRESENT, SUGGEST RECOLLECTION (A)  Final   Report Status 01/10/2020 FINAL  Final  SARS Coronavirus 2 by RT PCR (hospital order, performed in [place] hospital lab) Nasopharyngeal Nasopharyngeal Swab     Status: None   Collection Time: 01/09/20  3:38 AM   Specimen: Nasopharyngeal Swab  Result Value Ref Range Status   SARS Coronavirus 2 NEGATIVE NEGATIVE Final    Comment: (NOTE) SARS-CoV-2 target nucleic acids are NOT DETECTED.  The SARS-CoV-2 RNA is generally detectable in upper and lower respiratory specimens during the acute phase of infection. The lowest concentration of SARS-CoV-2 viral copies this assay can detect is 250 copies / mL. A negative result does not preclude SARS-CoV-2 infection and should not be used as the sole basis for treatment or other patient management decisions.  A negative result may occur with improper specimen collection / handling, submission of specimen other than nasopharyngeal swab, presence of viral mutation(s) within the areas targeted by this assay, and inadequate number of viral copies (<250 copies / mL). A negative result must be combined with clinical observations, patient history, and epidemiological information.  Fact Sheet for Patients:   StrictlyIdeas.no  Fact Sheet for Healthcare Providers: BankingDealers.co.za  This test is not yet approved or  cleared by the [place] FDA and has been authorized for detection and/or diagnosis of SARS-CoV-2 by FDA under an Emergency Use Authorization (EUA).  This EUA will remain in effect (meaning this test can be used) for the duration of the COVID-19 declaration under Section 564(b)(1) of the Act, 21 U.S.C. section 360bbb-3(b)(1), unless the authorization is terminated or revoked sooner.  Performed at [facility], [phone]  [address] [place], [place] [address]     Radiology Reports DG Chest Portable 1  View  Result Date: 01/09/2020 CLINICAL DATA:  74 year old male with persistent cough and increased weakness following 1 month of rehab for knee surgery. EXAM: PORTABLE CHEST 1 VIEW COMPARISON:  Portable chest 03/06/2019. FINDINGS: Portable AP semi upright view at 0420 hours. Stable lung volumes from last year with mild chronic elevation of the right hemidiaphragm. Stable cardiac size at the upper limits of normal. Other mediastinal contours are within normal limits. Allowing for portable technique the lungs are clear. No pneumothorax or pleural effusion. Paucity of bowel gas in the upper abdomen. No acute osseous abnormality identified. IMPRESSION: No acute cardiopulmonary abnormality. Electronically Signed   By: Genevie Ann M.D.   On: 01/09/2020 04:46    Lab Data:  CBC: Recent Labs  Lab 01/08/20 1819 01/10/20 0446  WBC 5.7 4.9  HGB 12.9* 12.0*  HCT 42.3 37.3*  MCV 98.1 94.4  PLT 138* 697*   Basic Metabolic Panel: Recent Labs  Lab 01/08/20 1819 01/10/20 0446  NA 142 144  K 3.7 3.9  CL 104 100  CO2 30 33*  GLUCOSE 150* 101*  BUN 25* 23  CREATININE 0.86 0.63  CALCIUM 8.2* 8.2*   GFR: Estimated Creatinine Clearance: 103.7 mL/min (by C-G formula based on SCr of 0.63 mg/dL). Liver Function Tests: No results for input(s): AST, ALT, ALKPHOS, BILITOT, PROT, ALBUMIN in the last 168 hours. No results for input(s): LIPASE, AMYLASE in the last 168 hours. No results for input(s): AMMONIA in the last 168 hours. Coagulation Profile: No results for input(s): INR, PROTIME in the last 168 hours. Cardiac Enzymes: No results for input(s): CKTOTAL, CKMB, CKMBINDEX, TROPONINI in the last 168 hours. BNP (last 3 results) No results for input(s): PROBNP in the last 8760 hours. HbA1C: No results for input(s): HGBA1C in the last 72 hours. CBG: No results for input(s): GLUCAP in the last 168 hours. Lipid Profile: No results for input(s): CHOL, HDL, LDLCALC, TRIG, CHOLHDL, LDLDIRECT in the last 72  hours. Thyroid Function Tests: No results for input(s): TSH, T4TOTAL, FREET4, T3FREE, THYROIDAB in the last 72 hours. Anemia Panel: No results for input(s): VITAMINB12, FOLATE, FERRITIN, TIBC, IRON, RETICCTPCT in the last 72 hours. Urine analysis:    Component Value Date/Time   COLORURINE AMBER (A) 01/09/2020 0206   APPEARANCEUR CLOUDY (A) 01/09/2020 0206   LABSPEC 1.025 01/09/2020 0206   PHURINE 5.0 01/09/2020 0206   GLUCOSEU NEGATIVE 01/09/2020 0206   HGBUR NEGATIVE 01/09/2020 0206   BILIRUBINUR NEGATIVE 01/09/2020 0206   KETONESUR NEGATIVE 01/09/2020 0206   PROTEINUR 30 (A) 01/09/2020 0206   NITRITE POSITIVE (A) 01/09/2020 0206   LEUKOCYTESUR MODERATE (A) 01/09/2020 0206     Dartanion Teo M.D. [facility] 01/10/2020, 10:55 AM   Call night coverage person covering after 7pm

## 2020-01-11 ENCOUNTER — Inpatient Hospital Stay: Admit: 2020-01-11 | Payer: Medicare Other

## 2020-01-11 ENCOUNTER — Inpatient Hospital Stay
Admit: 2020-01-11 | Discharge: 2020-01-11 | Disposition: A | Discharge status: Home / Self Care | Payer: Medicare Other | Attending: Internal Medicine | Admitting: Internal Medicine

## 2020-01-11 LAB — BASIC METABOLIC PANEL
Anion gap: 6 (ref 5–15)
BUN: 20 mg/dL (ref 8–23)
CO2: 34 mmol/L — ABNORMAL HIGH (ref 22–32)
Calcium: 8 mg/dL — ABNORMAL LOW (ref 8.9–10.3)
Chloride: 100 mmol/L (ref 98–111)
Creatinine, Ser: 0.69 mg/dL (ref 0.61–1.24)
GFR calc Af Amer: >60 mL/min (ref >60)
GFR calc non Af Amer: >60 mL/min (ref >60)
Glucose, Bld: 97 mg/dL (ref 70–99)
Potassium: 3.9 mmol/L (ref 3.5–5.1)
Sodium: 140 mmol/L (ref 135–145)

## 2020-01-11 LAB — ECHOCARDIOGRAM COMPLETE
Height: 68 in
Weight: 4483.27 oz

## 2020-01-11 MED ORDER — PERFLUTREN LIPID MICROSPHERE
1.0000 mL | INTRAVENOUS | Status: AC | PRN
  Administered 2020-01-11: 4 mL via INTRAVENOUS
  Filled 2020-01-11: qty 10

## 2020-01-11 MED ORDER — FUROSEMIDE 20 MG PO TABS
20.0000 mg | ORAL_TABLET | Freq: Every day | ORAL | 11 refills | Status: AC
Start: 2020-01-11 — End: 2021-01-10

## 2020-01-11 MED ORDER — NYSTATIN 100000 UNIT/GM EX POWD: Freq: Two times a day (BID) | CUTANEOUS | 3 refills | Status: AC

## 2020-01-11 MED ORDER — FOSFOMYCIN TROMETHAMINE 3 G PO PACK
3.0000 g | PACK | Freq: Once | ORAL | Status: AC
  Administered 2020-01-11: 3 g via ORAL
  Filled 2020-01-11: qty 3

## 2020-01-11 NOTE — Discharge Summary (Signed)
Physician Discharge Summary   Patient ID: Curtis Alexander MRN: [id number] DOB/AGE: [birth year] 74 y.o.  Admit date: 01/09/2020 Discharge date: 01/11/2020  Primary Care Physician:  [name]   Recommendations for Outpatient Follow-up:  1. Follow up with PCP in 1-2 weeks 2. Patient strongly recommended to take his Lasix at home  Home Health: Home health PT Equipment/Devices:   Discharge Condition: stable CODE STATUS: FULL  Diet recommendation: Low-sodium heart healthy diet   Discharge Diagnoses:    . Urinary tract infection Acute on chronic diastolic CHF . Hypertension . Morbid obesity ([place]) . Sleep apnea . Anxiety disorder . Venous insufficiency Glaucoma Dyslipidemia Medical noncompliance  Consults: None    Allergies:  No Known Allergies   DISCHARGE MEDICATIONS: Allergies as of 01/11/2020   No Known Allergies     Medication List    TAKE these medications   allopurinol 300 MG tablet Commonly known as: ZYLOPRIM Take 300 mg by mouth daily with lunch.   B-12 2500 MCG Tabs Take 2,500 mcg by mouth daily.   brimonidine 0.2 % ophthalmic solution Commonly known as: ALPHAGAN Place 1 drop into the right eye 2 (two) times daily.   clonazePAM 0.5 MG tablet Commonly known as: KLONOPIN Take 0.25 mg by mouth See admin instructions. Take 0.25 in the morning, 0.25 at lunch, 0.5 mg at bedtime   Cranberry 250 MG Tabs Take 250 mg by mouth daily.   cyclobenzaprine 10 MG tablet Commonly known as: FLEXERIL Take 10 mg by mouth in the morning and at bedtime.   ferrous sulfate 325 (65 FE) MG tablet Take 1 tablet (325 mg total) by mouth 2 (two) times daily with a meal. What changed: when to take this   finasteride 5 MG tablet Commonly known as: PROSCAR Take 5 mg by mouth daily with lunch.   furosemide 20 MG tablet Commonly known as: Lasix Take 1 tablet (20 mg total) by mouth daily.   gabapentin 300 MG capsule Commonly known as: NEURONTIN Take 300 mg by  mouth 2 (two) times daily.   latanoprost 0.005 % ophthalmic solution Commonly known as: XALATAN Place 1 drop into the right eye at bedtime.   loratadine 10 MG tablet Commonly known as: CLARITIN Take 10 mg by mouth daily as needed for allergies.   mirtazapine 45 MG tablet Commonly known as: REMERON Take 45 mg by mouth at bedtime.   nystatin powder Commonly known as: MYCOSTATIN/NYSTOP Apply topically 2 (two) times daily. Apply to abdominal folds /chest wall crease   omeprazole 40 MG capsule Commonly known as: PRILOSEC Take 40 mg by mouth daily with lunch.   primidone 50 MG tablet Commonly known as: MYSOLINE Take 50-75 mg by mouth See admin instructions. Take 1 tablets (75mg ) by mouth every morning and take 1 tablet (50mg ) by mouth every afternoon   propranolol 40 MG tablet Commonly known as: INDERAL Take 40 mg by mouth 2 (two) times daily.   simvastatin 20 MG tablet Commonly known as: ZOCOR Take 1 tablet by mouth every evening.   tamsulosin 0.4 MG Caps capsule Commonly known as: FLOMAX Take 0.4 mg by mouth at bedtime.   Vitamin D 50 MCG (2000 UT) Caps Take 2,000 Units by mouth daily.   zolpidem 5 MG tablet Commonly known as: AMBIEN Take 5 mg by mouth at bedtime.            Discharge Care Instructions  (From admission, onward)         Start     Ordered  01/11/20 0000  Change dressing (specify)       Comments: Dressing change: daily   01/11/20 0807           Brief H and P: For complete details please refer to admission H and P, but in brief, Patient is a 74 year old male with known history of diabetes mellitus, hypertension, OSA, history of previous resistant UTI, presented to ED with acute onset of generalized weakness, progressing over the last 2 to 3 weeks PTA.  Denies any abdominal pain or dysuria, increased frequency or flank pain.  No fevers or chills. In ED, UA positive for UTI, urine culture was sent Normal BMET.  Patient was placed on IV  Zosyn, admitted for further work-up.  Hospital Course:  Acute metabolic encephalopathy, likely due to acute urinary tract infection: With a history of prior resistant UTI, presented with generalized weakness -Patient was initially started on IV Zosyn based on the primary urine cultures in 02/2019 -Urine culture this admission showed multiple species  -Received 3 days of IV Zosyn, gave 1 dose of fosfomycin prior to discharge   Bilateral lower extremity edema, possibly due to acute on chronic diastolic CHF -Patient noted to have 2-3+ pitting edema bilateral lower extremities, states has been worsening in the last 3 months, has chronic edema, on Lasix but has been noncompliant outpatient -Patient was seen by cardiology in 02/2019 prior to the knee surgery.  2D echo had shown EF> 55%, normal left and right ventricular systolic function, trivial MR, trivial TR -Placed on IV Lasix on admission, negative balance of 2.7 L.  However he then refused to take any Lasix while inpatient due to frequent urination.  -He states that he will start taking the Lasix once he gets home after the Father's Day, so that Lasix does not interrupt his family time -Counseled patient strongly on low-sodium diet, compliance with Lasix, TED hoses.  He requested to be placed on regular diet, refused low-sodium diet. -Doppler ultrasound lower extremity showed no DVT showed EF of 55 to 03%, grade 1 diastolic dysfunction    Anxiety disorder -Continue Klonopin    Diabetes mellitus, type II ([place]), diet controlled -Recommended to follow-up on CBGs, diet controlled, not on any medications at home    Hypertension -Continue propranolol outpatient, Lasix  Gout Continue allopurinol  Dyslipidemia Continue statin  Glaucoma Continue ophthalmic eyedrops   Morbid obesity Counseled on diet and weight control Estimated body mass index is 42.6 kg/m as calculated from the following:   Height as of this encounter: 5\' 8"   (1.727 m).   Weight as of this encounter: 127.1 kg.   Generalized deconditioning PT evaluation recommended home health, arranged by TOC   Day of Discharge S: No acute complaints, slept well last night, no chest pain or shortness of breath.  Hoping to go home today  BP (!) 107/59   Pulse 66   Temp 98.4 F (36.9 C) (Oral)   Resp 18   Ht 5\' 8"  (1.727 m)   Wt 127.1 kg   SpO2 92%   BMI 42.60 kg/m   Physical Exam: General: Alert and awake oriented x3 not in any acute distress. HEENT: anicteric sclera, pupils reactive to light and accommodation CVS: S1-S2 clear no murmur rubs or gallops Chest: clear to auscultation bilaterally, no wheezing rales or rhonchi Abdomen: soft nontender, nondistended, normal bowel sounds Extremities: no cyanosis, clubbing, 1-2+ edema noted bilaterally Neuro: Cranial nerves II-XII intact, no focal neurological deficits    Get Medicines reviewed and adjusted: Please  take all your medications with you for your next visit with your Primary MD  Please request your Primary MD to go over all hospital tests and procedure/radiological results at the follow up. Please ask your Primary MD to get all Hospital records sent to his/her office.  If you experience worsening of your admission symptoms, develop shortness of breath, life threatening emergency, suicidal or homicidal thoughts you must seek medical attention immediately by calling 911 or calling your MD immediately  if symptoms less severe.  You must read complete instructions/literature along with all the possible adverse reactions/side effects for all the Medicines you take and that have been prescribed to you. Take any new Medicines after you have completely understood and accept all the possible adverse reactions/side effects.   Do not drive when taking pain medications.   Do not take more than prescribed Pain, Sleep and Anxiety Medications  Special Instructions: If you have smoked or chewed Tobacco   in the last 2 yrs please stop smoking, stop any regular Alcohol  and or any Recreational drug use.  Wear Seat belts while driving.  Please note  You were cared for by a hospitalist during your hospital stay. Once you are discharged, your primary care physician will handle any further medical issues. Please note that NO REFILLS for any discharge medications will be authorized once you are discharged, as it is imperative that you return to your primary care physician (or establish a relationship with a primary care physician if you do not have one) for your aftercare needs so that they can reassess your need for medications and monitor your lab values.   The results of significant diagnostics from this hospitalization (including imaging, microbiology, ancillary and laboratory) are listed below for reference.      Procedures/Studies:  US Venous Img Lower Bilateral (DVT)  Result Date: 01/10/2020 CLINICAL DATA:  Edema EXAM: BILATERAL LOWER EXTREMITY VENOUS DOPPLER ULTRASOUND TECHNIQUE: Gray-scale sonography with compression, as well as color and duplex ultrasound, were performed to evaluate the deep venous system(s) from the level of the common femoral vein through the popliteal and proximal calf veins. COMPARISON:  03/26/2019 FINDINGS: VENOUS Normal compressibility of the common femoral, superficial femoral, and popliteal veins, as well as the visualized calf veins. Visualized portions of profunda femoral vein and great saphenous vein unremarkable. No filling defects to suggest DVT on grayscale or color Doppler imaging. Doppler waveforms show normal direction of venous flow, normal respiratory phasicity and response to augmentation. OTHER Subcutaneous edema left calf. Limitations: none IMPRESSION: No femoropopliteal DVT nor evidence of DVT within the visualized calf veins. If clinical symptoms are inconsistent or if there are persistent or worsening symptoms, further imaging (possibly involving the  iliac veins) may be warranted. Electronically Signed   By: Lucrezia Europe M.D.   On: 01/10/2020 13:33   DG Chest Portable 1 View  Result Date: 01/09/2020 CLINICAL DATA:  74 year old male with persistent cough and increased weakness following 1 month of rehab for knee surgery. EXAM: PORTABLE CHEST 1 VIEW COMPARISON:  Portable chest 03/06/2019. FINDINGS: Portable AP semi upright view at 0420 hours. Stable lung volumes from last year with mild chronic elevation of the right hemidiaphragm. Stable cardiac size at the upper limits of normal. Other mediastinal contours are within normal limits. Allowing for portable technique the lungs are clear. No pneumothorax or pleural effusion. Paucity of bowel gas in the upper abdomen. No acute osseous abnormality identified. IMPRESSION: No acute cardiopulmonary abnormality. Electronically Signed   By: Genevie Ann  M.D.   On: 01/09/2020 04:46   ECHOCARDIOGRAM COMPLETE  Result Date: 01/11/2020    ECHOCARDIOGRAM REPORT   Patient Name:   Curtis Alexander Date of Exam: 01/11/2020 Medical Rec #:  [id number]        Height:       68.0 in Accession #:    [id number]       Weight:       280.2 lb Date of Birth:  24-Jan-1946         BSA:          2.359 m Patient Age:    42 years         BP:           107/59 mmHg Patient Gender: M                HR:           66 bpm. Exam Location:  [facility] Procedure: 2D Echo and Intracardiac Opacification Agent Indications:     Acute Diastolic CHF  History:         Patient has no prior history of Echocardiogram examinations.                  Risk Factors:Morbid Obesity, Hypertension and Diabetes.  Sonographer:     L Thornton-Maynard Referring Phys:  1025 Ammiel Guiney K Antwaun Buth Diagnosing Phys: Isaias Cowman MD  Sonographer Comments: Technically difficult study due to poor echo windows and patient is morbidly obese. IMPRESSIONS  1. Left ventricular ejection fraction, by estimation, is 55 to 60%. The left ventricle has normal function. The left ventricle has no regional  wall motion abnormalities. Left ventricular diastolic parameters are consistent with Grade I diastolic dysfunction (impaired relaxation).  2. Right ventricular systolic function is normal. The right ventricular size is normal. There is mildly elevated pulmonary artery systolic pressure.  3. The mitral valve is normal in structure. Trivial mitral valve regurgitation. No evidence of mitral stenosis.  4. The aortic valve is normal in structure. Aortic valve regurgitation is not visualized. No aortic stenosis is present.  5. The inferior vena cava is normal in size with greater than 50% respiratory variability, suggesting right atrial pressure of 3 mmHg. FINDINGS  Left Ventricle: Left ventricular ejection fraction, by estimation, is 55 to 60%. The left ventricle has normal function. The left ventricle has no regional wall motion abnormalities. Definity contrast agent was given IV to delineate the left ventricular  endocardial borders. The left ventricular internal cavity size was normal in size. There is no left ventricular hypertrophy. Left ventricular diastolic parameters are consistent with Grade I diastolic dysfunction (impaired relaxation). Right Ventricle: The right ventricular size is normal. No increase in right ventricular wall thickness. Right ventricular systolic function is normal. There is mildly elevated pulmonary artery systolic pressure. The tricuspid regurgitant velocity is 2.86  m/s, and with an assumed right atrial pressure of 10 mmHg, the estimated right ventricular systolic pressure is 85.2 mmHg. Left Atrium: Left atrial size was normal in size. Right Atrium: Right atrial size was normal in size. Pericardium: There is no evidence of pericardial effusion. Mitral Valve: The mitral valve is normal in structure. Normal mobility of the mitral valve leaflets. Trivial mitral valve regurgitation. No evidence of mitral valve stenosis. MV peak gradient, 3.1 mmHg. The mean mitral valve gradient is 1.0 mmHg.  Tricuspid Valve: The tricuspid valve is normal in structure. Tricuspid valve regurgitation is trivial. No evidence of tricuspid stenosis. Aortic Valve: The aortic valve is normal in structure.  Aortic valve regurgitation is not visualized. No aortic stenosis is present. Aortic valve mean gradient measures 6.0 mmHg. Aortic valve peak gradient measures 9.5 mmHg. Aortic valve area, by VTI measures 2.98 cm. Pulmonic Valve: The pulmonic valve was normal in structure. Pulmonic valve regurgitation is not visualized. No evidence of pulmonic stenosis. Aorta: The aortic root is normal in size and structure. Venous: The inferior vena cava is normal in size with greater than 50% respiratory variability, suggesting right atrial pressure of 3 mmHg. IAS/Shunts: No atrial level shunt detected by color flow Doppler.  LEFT VENTRICLE PLAX 2D LVIDd:         3.05 cm  Diastology LVIDs:         2.48 cm  LV e' lateral:   6.85 cm/s LV PW:         1.12 cm  LV E/e' lateral: 10.5 LV IVS:        1.80 cm  LV e' medial:    6.42 cm/s LVOT diam:     2.50 cm  LV E/e' medial:  11.2 LV SV:         112 LV SV Index:   47 LVOT Area:     4.91 cm  RIGHT VENTRICLE RV S prime:     17.30 cm/s TAPSE (M-mode): 2.5 cm LEFT ATRIUM             Index       RIGHT ATRIUM           Index LA diam:        5.10 cm 2.16 cm/m  RA Area:     13.60 cm LA Vol (A2C):   51.0 ml 21.62 ml/m RA Volume:   31.20 ml  13.23 ml/m LA Vol (A4C):   38.2 ml 16.20 ml/m LA Biplane Vol: 41.0 ml 17.38 ml/m  AORTIC VALVE AV Area (Vmax):    3.02 cm AV Area (Vmean):   3.31 cm AV Area (VTI):     2.98 cm AV Vmax:           154.00 cm/s AV Vmean:          113.000 cm/s AV VTI:            0.375 m AV Peak Grad:      9.5 mmHg AV Mean Grad:      6.0 mmHg LVOT Vmax:         94.60 cm/s LVOT Vmean:        76.100 cm/s LVOT VTI:          0.228 m LVOT/AV VTI ratio: 0.61 MITRAL VALVE               TRICUSPID VALVE MV Area (PHT): 2.44 cm    TR Peak grad:   32.7 mmHg MV Peak grad:  3.1 mmHg    TR Vmax:         286.00 cm/s MV Mean grad:  1.0 mmHg MV Vmax:       0.88 m/s    SHUNTS MV Vmean:      55.4 cm/s   Systemic VTI:  0.23 m MV E velocity: 71.60 cm/s  Systemic Diam: 2.50 cm MV A velocity: 89.10 cm/s MV E/A ratio:  0.80 Isaias Cowman MD Electronically signed by Isaias Cowman MD Signature Date/Time: 01/11/2020/2:08:21 PM    Final        LAB RESULTS: Basic Metabolic Panel: Recent Labs  Lab 01/10/20 0446 01/11/20 0502  NA 144 140  K 3.9 3.9  CL  100 100  CO2 33* 34*  GLUCOSE 101* 97  BUN 23 20  CREATININE 0.63 0.69  CALCIUM 8.2* 8.0*   Liver Function Tests: Recent Labs  Lab 01/10/20 0446  AST 17  ALT 14  ALKPHOS 69  BILITOT 0.5  PROT 5.4*  ALBUMIN 2.8*   No results for input(s): LIPASE, AMYLASE in the last 168 hours. No results for input(s): AMMONIA in the last 168 hours. CBC: Recent Labs  Lab 01/08/20 1819 01/08/20 1819 01/10/20 0446  WBC 5.7  --  4.9  HGB 12.9*  --  12.0*  HCT 42.3  --  37.3*  MCV 98.1   < > 94.4  PLT 138*  --  120*   < > = values in this interval not displayed.   Cardiac Enzymes: No results for input(s): CKTOTAL, CKMB, CKMBINDEX, TROPONINI in the last 168 hours. BNP: Invalid input(s): POCBNP CBG: No results for input(s): GLUCAP in the last 168 hours.     Disposition and Follow-up: Discharge Instructions    Change dressing (specify)   Complete by: As directed    Dressing change: daily   Diet - low sodium heart healthy   Complete by: As directed    Increase activity slowly   Complete by: As directed        DISPOSITION: home    DISCHARGE FOLLOW-UP  Follow-up Information    [name]. Schedule an appointment as soon as possible for a visit in 2 week(s).   Specialty: Family Medicine Contact information: [address] [place] [address] [phone]                Time coordinating discharge:  61mins    Signed:   Estill Cotta M.D. [facility] 01/11/2020, 2:20 PM

## 2020-01-11 NOTE — TOC Transition Note (Signed)
Transition of Care [facility]) - CM/SW Discharge Note   Patient Details  Name: Tavious Griesinger MRN: [id number] Date of Birth: 1946/04/19  [facility] CM/SW Contact:  Boris Sharper, LCSW Phone Number: 01/11/2020, 1:55 PM   Clinical Narrative:    Pt medically stable for discharge per MD. Pt will be transported home by his daughter. Pt is established with [facility] care for [facility] and will resume once discharged.   Final next level of care: [place] Barriers to Discharge: No Barriers Identified   Patient Goals and CMS Choice        Discharge Placement                Patient to be transferred to facility by: Daughter Name of family member notified: [name] Patient and family notified of of transfer: 01/11/20  Discharge Plan and Services                          HH Arranged: PT [facility] Agency: [place] Date [facility] Agency Contacted: 01/11/20 Time [place]: 1355    Social Determinants of Health (SDOH) Interventions     Readmission Risk Interventions Readmission Risk Prevention Plan 03/30/2019  Transportation Screening Complete  PCP or Specialist Appt within 5-7 Days Complete  Home Care Screening Complete  Medication Review (RN CM) Complete

## 2020-01-11 NOTE — Evaluation (Signed)
Physical Therapy Evaluation Patient Details Name: Curtis Alexander MRN: [id number] DOB: 1946-07-20 Today's Date: 01/11/2020   History of Present Illness  Pt is a 74 y.o. male presenting to hospital 6/18 with dysuria, foul smelling urine, and progressive weakness x1 month.  Pt admitted with UTI with subsequent generalized weakness, gout, B LE edema, and acute metabolic encephalopathy.  PMH includes UTI with ESBL, DM, h/o hiatal hernia, htn, sleep apnea, TKR R, anxiety, morbid obesity, R patellar tendon repair 03/28/19.  Clinical Impression  Prior to hospital admission, pt was modified independent with w/c level transfers and manual w/c propulsion (pt was able to ambulate about 30 feet with walker when he left rehab about 1 month ago but most recently using manual w/c d/t weakness); was receiving HHPT and also [place] aide to assist with bathing; lives with his daughter who pt reports is able to provide 24/7 assist for him.  Currently pt is SBA with bed mobility (pt reports normally sleeping in lift chair); CGA with transfers with bariatric RW use; and CGA to ambulate up to 4 feet with bariatric RW.  Limited distance ambulating d/t fatigue/generalized weakness.  Pt would benefit from skilled PT to address noted impairments and functional limitations (see below for any additional details).  Upon hospital discharge, pt would benefit from [place] and 24/7 assist for functional mobility for safety (pt reports his daughter is able to provide this assist); pt reports plan for w/c level functional mobility upon hospital discharge and will attempt to progress ambulation/mobility with HHPT.    Follow Up Recommendations Home health PT;Supervision for mobility/OOB    Equipment Recommendations   (pt already has RW and manual w/c at home)    Recommendations for Other Services       Precautions / Restrictions Precautions Precautions: Fall Restrictions Weight Bearing Restrictions: No      Mobility  Bed  Mobility Overal bed mobility: Needs Assistance Bed Mobility: Supine to Sit;Sit to Supine     Supine to sit: Supervision;HOB elevated Sit to supine: Supervision;HOB elevated   General bed mobility comments: increased effort to perform on own; use of bed rails; pt normally sleeps in lift chair  Transfers Overall transfer level: Needs assistance Equipment used: Rolling walker (2 wheeled) Transfers: Sit to/from [facility] Sit to Stand: Min guard Stand pivot transfers: Min guard       General transfer comment: x1 transfer from elevated bed (to simulate home surface heights) and x1 transfer from recliner (simulating manual w/c height); pt using momentum to assist with standing; pt tending to flex trunk forward in order to balance over UE's on walker; stand step turn recliner to bed with RW CGA  Ambulation/Gait Ambulation/Gait assistance: Min guard Gait Distance (Feet): 4 Feet Assistive device: Rolling walker (2 wheeled)   Gait velocity: decreased   General Gait Details: decreased B LE step length and foot clearance; pt tending to flex trunk forward in order to balance over UE's on walker (pt reports this is his normal technique he was taught by a therapist)  Stairs            Wheelchair Mobility    Modified Rankin (Stroke Patients Only)       Balance Overall balance assessment: Needs assistance Sitting-balance support: No upper extremity supported Sitting balance-Leahy Scale: Normal Sitting balance - Comments: steady sitting reaching outside BOS   Standing balance support: Bilateral upper extremity supported;During functional activity Standing balance-Leahy Scale: Good Standing balance comment: heavy UE support through RW; pt with forward flexed posture  in order to balance self over UE's holding onto walker (pt's baseline technique)                             Pertinent Vitals/Pain Pain Assessment: Faces Faces Pain Scale: Hurts a little  bit Pain Location: L knee Pain Descriptors / Indicators: Sore Pain Intervention(s): Limited activity within patient's tolerance;Monitored during session;Repositioned  Vitals stable and WFL throughout treatment session.    Home Living Family/patient expects to be discharged to:: Private residence Living Arrangements: Children;Other relatives (pt's daughter and granddaughter) Available Help at Discharge: Family;Available 24 hours/day Type of Home: House Home Access: Ramped entrance     Home Layout: Two level;Able to live on main level with bedroom/bathroom Home Equipment: Gilford Rile - 2 wheels;Bedside commode;Shower seat;Grab bars - toilet;Wheelchair - manual (bariatric RW)      Prior Function Level of Independence: Needs assistance   Gait / Transfers Assistance Needed: Pt reports being able to walk 30 feet with bariatric RW when he left rehab about 1 month ago but now uses manual w/c (able to propel manual w/c modified independently) d/t difficulty with walking.  ADL's / Homemaking Assistance Needed: Has [place] care 2x/week which assists pt with bathing        Hand Dominance        Extremity/Trunk Assessment   Upper Extremity Assessment Upper Extremity Assessment: Generalized weakness    Lower Extremity Assessment Lower Extremity Assessment: Generalized weakness    Cervical / Trunk Assessment Cervical / Trunk Assessment:  (forward head/shoulders)  Communication   Communication: No difficulties  Cognition Arousal/Alertness: Awake/alert Behavior During Therapy: WFL for tasks assessed/performed Overall Cognitive Status: Within Functional Limits for tasks assessed                                        General Comments General comments (skin integrity, edema, etc.): B LE edema noted.  Nursing cleared pt for participation in physical therapy.  Pt agreeable to PT session.    Exercises  Transfers and bed mobility   Assessment/Plan    PT Assessment Patient  needs continued PT services  PT Problem List Decreased strength;Decreased activity tolerance;Decreased balance;Decreased mobility;Pain       PT Treatment Interventions DME instruction;Gait training;Functional mobility training;Therapeutic activities;Therapeutic exercise;Balance training;Patient/family education    PT Goals (Current goals can be found in the Care Plan section)  Acute Rehab PT Goals Patient Stated Goal: to go home today PT Goal Formulation: With patient Time For Goal Achievement: 01/25/20 Potential to Achieve Goals: Good    Frequency Min 2X/week   Barriers to discharge        Co-evaluation               AM-PAC PT "6 Clicks" Mobility  Outcome Measure Help needed turning from your back to your side while in a flat bed without using bedrails?: A Little Help needed moving from lying on your back to sitting on the side of a flat bed without using bedrails?: A Little Help needed moving to and from a bed to a chair (including a wheelchair)?: A Little Help needed standing up from a chair using your arms (e.g., wheelchair or bedside chair)?: A Little Help needed to walk in hospital room?: A Lot Help needed climbing 3-5 steps with a railing? : Total 6 Click Score: 15    End of Session Equipment Utilized  During Treatment: Gait belt Activity Tolerance: Patient tolerated treatment well Patient left: in bed;with call bell/phone within reach;with bed alarm set;with nursing/sitter in room;with SCD's reapplied;Other (comment) (B LE's elevated on pillow with B heels floating) Nurse Communication: Mobility status;Precautions PT Visit Diagnosis: Other abnormalities of gait and mobility (R26.89);Muscle weakness (generalized) (M62.81);Difficulty in walking, not elsewhere classified (R26.2)    Time: 6314-9702 PT Time Calculation (min) (ACUTE ONLY): 42 min   Charges:   PT Evaluation $PT Eval Low Complexity: 1 Low PT Treatments $Therapeutic Activity: 23-37 mins        Leitha Bleak, PT 01/11/20, 9:36 AM

## 2020-01-14 ENCOUNTER — Ambulatory Visit: Payer: Medicare Other | Admitting: Urology

## 2020-01-16 ENCOUNTER — Encounter: Payer: Self-pay | Admitting: Urology

## 2020-01-16 ENCOUNTER — Ambulatory Visit (INDEPENDENT_AMBULATORY_CARE_PROVIDER_SITE_OTHER): Payer: Medicare Other | Admitting: Urology

## 2020-01-16 ENCOUNTER — Other Ambulatory Visit: Payer: Self-pay

## 2020-01-16 VITALS — BP 74/48 | HR 45 | Ht 68.0 in | Wt 280.0 lb

## 2020-01-16 DIAGNOSIS — R310 Gross hematuria: Secondary | ICD-10-CM | POA: Diagnosis not present

## 2020-01-16 DIAGNOSIS — N390 Urinary tract infection, site not specified: Secondary | ICD-10-CM

## 2020-01-16 NOTE — Progress Notes (Signed)
01/16/2020 1:29 PM   Nettie Elm 31-Jan-1946 [id number]  Referring provider: Dereck Leep, MD [address] [place],  [place] [address]  Chief Complaint  Patient presents with  . Recurrent UTI    HPI: Curtis Alexander is a 74 yo male seen at the request of Dr. Marry Guan for evaluation of recurrent UTIs.  -History of MDR UTIs -Atypical symptoms; no dysuria, fever, frequency or urgency -States symptoms are weakness and foul odor to urine -Hospitalized 6/18 for acute metabolic encephalopathy possibly secondary to UTI though urine culture grew multiple species -Review of urine cultures and Epic, several with mixed flora, insignificant growth -E. coli ESBL 02/2019 -Prior history of recurrent stone disease -Wife states saw urologist many years ago for urinary calculi but none recent -Has noted gross hematuria on occasions -On tamsulosin and finasteride -Dr. Marry Guan requested urologic eval for his recurrent UTIs prior to further orthopedic procedures he will need in the future  PMH: Past Medical History:  Diagnosis Date  . Arthritis   . Cervical spinal stenosis   . Diabetes mellitus without complication ([place])   . Essential tremor   . GERD (gastroesophageal reflux disease)   . History of hiatal hernia    fixed with gastric bypass surgery  . History of kidney stones   . Hypertension   . Hypoglycemia   . Non-traumatic rupture of right patellar tendon 03/25/2019  . Sleep apnea    had gastric bypass and no longer uses cpap machine  . Spinal stenosis, lumbar     Surgical History: Past Surgical History:  Procedure Laterality Date  . FRACTURE SURGERY Left    age 63  . GASTRIC BYPASS     fixed hiatal hernia  . KNEE ARTHROPLASTY Right 03/05/2019   Procedure: COMPUTER ASSISTED TOTAL KNEE ARTHROPLASTY;  Surgeon: Dereck Leep, MD;  Location: [facility] ORS;  Service: Orthopedics;  Laterality: Right;  . PATELLAR TENDON REPAIR Right 03/28/2019   Procedure:  PATELLA TENDON REPAIR;  Surgeon: Dereck Leep, MD;  Location: [facility] ORS;  Service: Orthopedics;  Laterality: Right;  . SKIN SURGERY     after gastric bypass and pt became septic and had wound vac placed  . VENA CAVA FILTER PLACEMENT     WAS PLACED DURING PTS GASTRIC BYPASS SURGERY PER PT    Home Medications:  Allergies as of 01/16/2020   No Known Allergies     Medication List       Accurate as of January 16, 2020  1:29 PM. If you have any questions, ask your nurse or doctor.        allopurinol 300 MG tablet Commonly known as: ZYLOPRIM Take 300 mg by mouth daily with lunch.   B-12 2500 MCG Tabs Take 2,500 mcg by mouth daily.   brimonidine 0.2 % ophthalmic solution Commonly known as: ALPHAGAN Place 1 drop into the right eye 2 (two) times daily.   clonazePAM 0.5 MG tablet Commonly known as: KLONOPIN Take 0.25 mg by mouth See admin instructions. Take 0.25 in the morning, 0.25 at lunch, 0.5 mg at bedtime   Cranberry 250 MG Tabs Take 250 mg by mouth daily.   cyclobenzaprine 10 MG tablet Commonly known as: FLEXERIL Take 10 mg by mouth in the morning and at bedtime.   ferrous sulfate 325 (65 FE) MG tablet Take 1 tablet (325 mg total) by mouth 2 (two) times daily with a meal. What changed: when to take this   finasteride 5 MG tablet Commonly known as:  PROSCAR Take 5 mg by mouth daily with lunch.   furosemide 20 MG tablet Commonly known as: Lasix Take 1 tablet (20 mg total) by mouth daily.   gabapentin 300 MG capsule Commonly known as: NEURONTIN Take 300 mg by mouth 2 (two) times daily.   latanoprost 0.005 % ophthalmic solution Commonly known as: XALATAN Place 1 drop into the right eye at bedtime.   loratadine 10 MG tablet Commonly known as: CLARITIN Take 10 mg by mouth daily as needed for allergies.   mirtazapine 45 MG tablet Commonly known as: REMERON Take 45 mg by mouth at bedtime.   nystatin powder Commonly known as: MYCOSTATIN/NYSTOP Apply topically 2  (two) times daily. Apply to abdominal folds /chest wall crease   omeprazole 40 MG capsule Commonly known as: PRILOSEC Take 40 mg by mouth daily with lunch.   primidone 50 MG tablet Commonly known as: MYSOLINE Take 50-75 mg by mouth See admin instructions. Take 1 tablets (75mg ) by mouth every morning and take 1 tablet (50mg ) by mouth every afternoon   propranolol 40 MG tablet Commonly known as: INDERAL Take 40 mg by mouth 2 (two) times daily.   simvastatin 20 MG tablet Commonly known as: ZOCOR Take 1 tablet by mouth every evening.   tamsulosin 0.4 MG Caps capsule Commonly known as: FLOMAX Take 0.4 mg by mouth at bedtime.   Vitamin D 50 MCG (2000 UT) Caps Take 2,000 Units by mouth daily.   zolpidem 5 MG tablet Commonly known as: AMBIEN Take 5 mg by mouth at bedtime.       Allergies: No Known Allergies  Family History: History reviewed. No pertinent family history.  Social History:  reports that he has never smoked. He has never used smokeless tobacco. He reports current alcohol use. He reports that he does not use drugs.   Physical Exam: BP (!) 74/48   Pulse (!) 45   Ht 5\' 8"  (1.727 m)   Wt 280 lb (127 kg)   BMI 42.57 kg/m   Constitutional:  Alert and oriented, No acute distress. HEENT: [place] AT, moist mucus membranes.  Trachea midline, no masses. Cardiovascular: No clubbing, cyanosis, or edema. Respiratory: Normal respiratory effort, no increased work of breathing. GI: Abdomen is obese, soft, nontender, nondistended, no abdominal masses GU: No CVA tenderness Skin: No rashes, bruises or suspicious lesions. Neurologic: Grossly intact, no focal deficits, moving all 4 extremities. Psychiatric: Normal mood and affect.  Laboratory Data:  Urinalysis Dipstick 1+ blood/negative leukocytes Microscopy 6-10 WBC/11-30 RBC/>10 epis  Assessment & Plan:    1. Recurrent UTI -Only 1 documented positive culture on record review -Urine today consistent with contamination  based on number of epithelial cells  2.  Gross hematuria -Intermittent -Recommend scheduling CT urogram/cystoscopy  3.  History nephrolithiasis -CT as above   Abbie Sons, MD  [facility] [address], [place], [place] [address] [phone]

## 2020-01-19 LAB — MICROSCOPIC EXAMINATION
Bacteria, UA: NONE SEEN
Epithelial Cells (non renal): >10 /hpf — AB (ref 0–10)

## 2020-01-19 LAB — URINALYSIS, COMPLETE
Bilirubin, UA: NEGATIVE
Glucose, UA: NEGATIVE
Ketones, UA: NEGATIVE
Leukocytes,UA: NEGATIVE
Nitrite, UA: NEGATIVE
Specific Gravity, UA: >1.03 — ABNORMAL HIGH (ref 1.005–1.030)
Urobilinogen, Ur: 0.2 mg/dL (ref 0.2–1.0)
pH, UA: 5 (ref 5.0–7.5)

## 2020-01-20 ENCOUNTER — Telehealth: Payer: Self-pay | Admitting: *Deleted

## 2020-01-20 LAB — CULTURE, URINE COMPREHENSIVE

## 2020-01-20 NOTE — Telephone Encounter (Signed)
-----   Message from Abbie Sons, MD sent at 01/20/2020  8:00 AM EDT ----- Urine culture was negative for infection.  Follow-up as scheduled

## 2020-01-20 NOTE — Telephone Encounter (Signed)
Notified patient as instructed, patient pleased. Discussed follow-up appointments, patient agrees  

## 2020-02-05 ENCOUNTER — Other Ambulatory Visit: Payer: Self-pay

## 2020-02-05 ENCOUNTER — Observation Stay: Payer: Medicare Other

## 2020-02-05 ENCOUNTER — Telehealth: Payer: Self-pay

## 2020-02-05 ENCOUNTER — Inpatient Hospital Stay
Admission: EM | Admit: 2020-02-05 | Discharge: 2020-02-12 | DRG: 690 | Disposition: A | Discharge status: Skilled Nursing Facility | Payer: Medicare Other | Attending: Family Medicine | Admitting: Family Medicine

## 2020-02-05 DIAGNOSIS — R0902 Hypoxemia: Secondary | ICD-10-CM | POA: Diagnosis not present

## 2020-02-05 DIAGNOSIS — Z79899 Other long term (current) drug therapy: Secondary | ICD-10-CM

## 2020-02-05 DIAGNOSIS — Z96651 Presence of right artificial knee joint: Secondary | ICD-10-CM | POA: Diagnosis present

## 2020-02-05 DIAGNOSIS — K219 Gastro-esophageal reflux disease without esophagitis: Secondary | ICD-10-CM | POA: Diagnosis present

## 2020-02-05 DIAGNOSIS — D3501 Benign neoplasm of right adrenal gland: Secondary | ICD-10-CM | POA: Diagnosis present

## 2020-02-05 DIAGNOSIS — B9620 Unspecified Escherichia coli [E. coli] as the cause of diseases classified elsewhere: Secondary | ICD-10-CM | POA: Diagnosis present

## 2020-02-05 DIAGNOSIS — F419 Anxiety disorder, unspecified: Secondary | ICD-10-CM | POA: Diagnosis present

## 2020-02-05 DIAGNOSIS — G4700 Insomnia, unspecified: Secondary | ICD-10-CM | POA: Diagnosis present

## 2020-02-05 DIAGNOSIS — Z20822 Contact with and (suspected) exposure to covid-19: Secondary | ICD-10-CM | POA: Diagnosis present

## 2020-02-05 DIAGNOSIS — N390 Urinary tract infection, site not specified: Secondary | ICD-10-CM | POA: Diagnosis not present

## 2020-02-05 DIAGNOSIS — E119 Type 2 diabetes mellitus without complications: Secondary | ICD-10-CM | POA: Diagnosis present

## 2020-02-05 DIAGNOSIS — I110 Hypertensive heart disease with heart failure: Secondary | ICD-10-CM | POA: Diagnosis present

## 2020-02-05 DIAGNOSIS — R310 Gross hematuria: Secondary | ICD-10-CM

## 2020-02-05 DIAGNOSIS — G250 Essential tremor: Secondary | ICD-10-CM | POA: Diagnosis present

## 2020-02-05 DIAGNOSIS — M109 Gout, unspecified: Secondary | ICD-10-CM | POA: Diagnosis present

## 2020-02-05 DIAGNOSIS — N3000 Acute cystitis without hematuria: Secondary | ICD-10-CM

## 2020-02-05 DIAGNOSIS — B9629 Other Escherichia coli [E. coli] as the cause of diseases classified elsewhere: Secondary | ICD-10-CM

## 2020-02-05 DIAGNOSIS — I5032 Chronic diastolic (congestive) heart failure: Secondary | ICD-10-CM | POA: Diagnosis present

## 2020-02-05 DIAGNOSIS — I10 Essential (primary) hypertension: Secondary | ICD-10-CM | POA: Diagnosis present

## 2020-02-05 DIAGNOSIS — Z9884 Bariatric surgery status: Secondary | ICD-10-CM

## 2020-02-05 DIAGNOSIS — Z6841 Body Mass Index (BMI) 40.0 and over, adult: Secondary | ICD-10-CM

## 2020-02-05 DIAGNOSIS — Z1612 Extended spectrum beta lactamase (ESBL) resistance: Secondary | ICD-10-CM | POA: Diagnosis present

## 2020-02-05 DIAGNOSIS — D696 Thrombocytopenia, unspecified: Secondary | ICD-10-CM | POA: Diagnosis present

## 2020-02-05 DIAGNOSIS — E785 Hyperlipidemia, unspecified: Secondary | ICD-10-CM | POA: Diagnosis present

## 2020-02-05 LAB — URINALYSIS, COMPLETE (UACMP) WITH MICROSCOPIC
Bilirubin Urine: NEGATIVE
Glucose, UA: NEGATIVE mg/dL
Ketones, ur: NEGATIVE mg/dL
Nitrite: POSITIVE — AB
Protein, ur: NEGATIVE mg/dL
Specific Gravity, Urine: 1.024 (ref 1.005–1.030)
WBC, UA: >50 WBC/hpf — ABNORMAL HIGH (ref 0–5)
pH: 5 (ref 5.0–8.0)

## 2020-02-05 LAB — CBC
HCT: 46.5 % (ref 39.0–52.0)
Hemoglobin: 13.9 g/dL (ref 13.0–17.0)
MCH: 29.8 pg (ref 26.0–34.0)
MCHC: 29.9 g/dL — ABNORMAL LOW (ref 30.0–36.0)
MCV: 99.8 fL (ref 80.0–100.0)
Platelets: 134 10*3/uL — ABNORMAL LOW (ref 150–400)
RBC: 4.66 MIL/uL (ref 4.22–5.81)
RDW: 13.8 % (ref 11.5–15.5)
WBC: 4.3 10*3/uL (ref 4.0–10.5)
nRBC: 0 % (ref 0.0–0.2)

## 2020-02-05 LAB — BASIC METABOLIC PANEL
Anion gap: 7 (ref 5–15)
BUN: 25 mg/dL — ABNORMAL HIGH (ref 8–23)
CO2: 33 mmol/L — ABNORMAL HIGH (ref 22–32)
Calcium: 8.3 mg/dL — ABNORMAL LOW (ref 8.9–10.3)
Chloride: 103 mmol/L (ref 98–111)
Creatinine, Ser: 0.83 mg/dL (ref 0.61–1.24)
GFR calc Af Amer: >60 mL/min (ref >60)
GFR calc non Af Amer: >60 mL/min (ref >60)
Glucose, Bld: 118 mg/dL — ABNORMAL HIGH (ref 70–99)
Potassium: 3.9 mmol/L (ref 3.5–5.1)
Sodium: 143 mmol/L (ref 135–145)

## 2020-02-05 LAB — SARS CORONAVIRUS 2 BY RT PCR (HOSPITAL ORDER, PERFORMED IN ~~LOC~~ HOSPITAL LAB): SARS Coronavirus 2: NEGATIVE

## 2020-02-05 MED ORDER — VITAMIN B-12 1000 MCG PO TABS
2500.0000 ug | ORAL_TABLET | Freq: Every day | ORAL | Status: DC
  Administered 2020-02-05 – 2020-02-12 (×8): 2500 ug via ORAL
  Filled 2020-02-05: qty 2.5
  Filled 2020-02-05 (×6): qty 3
  Filled 2020-02-05: qty 2.5
  Filled 2020-02-05: qty 3

## 2020-02-05 MED ORDER — ENOXAPARIN SODIUM 40 MG/0.4ML ~~LOC~~ SOLN
40.0000 mg | Freq: Two times a day (BID) | SUBCUTANEOUS | Status: DC
  Administered 2020-02-05 – 2020-02-12 (×14): 40 mg via SUBCUTANEOUS
  Filled 2020-02-05 (×14): qty 0.4

## 2020-02-05 MED ORDER — MIRTAZAPINE 15 MG PO TABS
45.0000 mg | ORAL_TABLET | Freq: Every day | ORAL | Status: DC
  Administered 2020-02-05 – 2020-02-11 (×7): 45 mg via ORAL
  Filled 2020-02-05 (×7): qty 3

## 2020-02-05 MED ORDER — ZOLPIDEM TARTRATE 5 MG PO TABS
5.0000 mg | ORAL_TABLET | Freq: Every day | ORAL | Status: DC
  Administered 2020-02-05 – 2020-02-11 (×7): 5 mg via ORAL
  Filled 2020-02-05 (×8): qty 1

## 2020-02-05 MED ORDER — ACETAMINOPHEN 325 MG PO TABS: 650.0000 mg | ORAL_TABLET | Freq: Four times a day (QID) | ORAL | Status: DC | PRN

## 2020-02-05 MED ORDER — ACETAMINOPHEN 650 MG RE SUPP: 650.0000 mg | Freq: Four times a day (QID) | RECTAL | Status: DC | PRN

## 2020-02-05 MED ORDER — CLONAZEPAM 0.25 MG PO TBDP: 0.2500 mg | ORAL_TABLET | Freq: Two times a day (BID) | ORAL | Status: DC

## 2020-02-05 MED ORDER — TAMSULOSIN HCL 0.4 MG PO CAPS
0.4000 mg | ORAL_CAPSULE | Freq: Every day | ORAL | Status: DC
  Administered 2020-02-05 – 2020-02-11 (×7): 0.4 mg via ORAL
  Filled 2020-02-05 (×7): qty 1

## 2020-02-05 MED ORDER — FERROUS SULFATE 325 (65 FE) MG PO TABS
325.0000 mg | ORAL_TABLET | ORAL | Status: DC
  Administered 2020-02-06 – 2020-02-12 (×6): 325 mg via ORAL
  Filled 2020-02-05 (×7): qty 1

## 2020-02-05 MED ORDER — FINASTERIDE 5 MG PO TABS
5.0000 mg | ORAL_TABLET | Freq: Every day | ORAL | Status: DC
  Administered 2020-02-06 – 2020-02-12 (×7): 5 mg via ORAL
  Filled 2020-02-05 (×9): qty 1

## 2020-02-05 MED ORDER — LORATADINE 10 MG PO TABS: 10.0000 mg | ORAL_TABLET | Freq: Every day | ORAL | Status: DC | PRN

## 2020-02-05 MED ORDER — SENNOSIDES-DOCUSATE SODIUM 8.6-50 MG PO TABS: 1.0000 | ORAL_TABLET | Freq: Every evening | ORAL | Status: DC | PRN

## 2020-02-05 MED ORDER — SIMVASTATIN 20 MG PO TABS
20.0000 mg | ORAL_TABLET | Freq: Every evening | ORAL | Status: DC
  Administered 2020-02-05 – 2020-02-11 (×7): 20 mg via ORAL
  Filled 2020-02-05: qty 1
  Filled 2020-02-05: qty 2
  Filled 2020-02-05 (×4): qty 1

## 2020-02-05 MED ORDER — FUROSEMIDE 20 MG PO TABS
20.0000 mg | ORAL_TABLET | Freq: Every day | ORAL | Status: DC
  Filled 2020-02-05 (×6): qty 1

## 2020-02-05 MED ORDER — ALLOPURINOL 100 MG PO TABS
300.0000 mg | ORAL_TABLET | Freq: Every day | ORAL | Status: DC
  Administered 2020-02-06 – 2020-02-12 (×7): 300 mg via ORAL
  Filled 2020-02-05: qty 1
  Filled 2020-02-05 (×7): qty 3

## 2020-02-05 MED ORDER — SODIUM CHLORIDE 0.9 % IV SOLN: 1.0000 g | Freq: Three times a day (TID) | INTRAVENOUS | Status: DC

## 2020-02-05 MED ORDER — CLONAZEPAM 0.5 MG PO TABS
0.5000 mg | ORAL_TABLET | Freq: Every day | ORAL | Status: DC
  Administered 2020-02-05 – 2020-02-11 (×7): 0.5 mg via ORAL
  Filled 2020-02-05 (×8): qty 1

## 2020-02-05 MED ORDER — ONDANSETRON HCL 4 MG PO TABS: 4.0000 mg | ORAL_TABLET | Freq: Four times a day (QID) | ORAL | Status: DC | PRN

## 2020-02-05 MED ORDER — PRIMIDONE 50 MG PO TABS
50.0000 mg | ORAL_TABLET | Freq: Every day | ORAL | Status: DC
  Administered 2020-02-06 – 2020-02-12 (×6): 50 mg via ORAL
  Filled 2020-02-05 (×7): qty 1

## 2020-02-05 MED ORDER — ONDANSETRON HCL 4 MG/2ML IJ SOLN: 4.0000 mg | Freq: Four times a day (QID) | INTRAMUSCULAR | Status: DC | PRN

## 2020-02-05 MED ORDER — PANTOPRAZOLE SODIUM 40 MG PO TBEC
40.0000 mg | DELAYED_RELEASE_TABLET | Freq: Every day | ORAL | Status: DC
  Administered 2020-02-05 – 2020-02-12 (×8): 40 mg via ORAL
  Filled 2020-02-05 (×8): qty 1

## 2020-02-05 MED ORDER — SODIUM CHLORIDE 0.9 % IV SOLN
1.0000 g | Freq: Three times a day (TID) | INTRAVENOUS | Status: DC
  Administered 2020-02-05 – 2020-02-08 (×9): 1 g via INTRAVENOUS
  Filled 2020-02-05 (×10): qty 1

## 2020-02-05 MED ORDER — PRIMIDONE 50 MG PO TABS: 50.0000 mg | ORAL_TABLET | ORAL | Status: DC

## 2020-02-05 MED ORDER — PRIMIDONE 50 MG PO TABS
75.0000 mg | ORAL_TABLET | Freq: Every morning | ORAL | Status: DC
  Administered 2020-02-06 – 2020-02-12 (×7): 75 mg via ORAL
  Filled 2020-02-05 (×11): qty 1.5

## 2020-02-05 MED ORDER — BRIMONIDINE TARTRATE 0.2 % OP SOLN
1.0000 [drp] | Freq: Two times a day (BID) | OPHTHALMIC | Status: DC
  Administered 2020-02-06 – 2020-02-12 (×10): 1 [drp] via OPHTHALMIC
  Filled 2020-02-05: qty 5

## 2020-02-05 MED ORDER — GABAPENTIN 300 MG PO CAPS
300.0000 mg | ORAL_CAPSULE | Freq: Two times a day (BID) | ORAL | Status: DC
  Administered 2020-02-05 – 2020-02-12 (×14): 300 mg via ORAL
  Filled 2020-02-05 (×14): qty 1

## 2020-02-05 MED ORDER — LATANOPROST 0.005 % OP SOLN
1.0000 [drp] | Freq: Every day | OPHTHALMIC | Status: DC
  Administered 2020-02-06 – 2020-02-11 (×7): 1 [drp] via OPHTHALMIC
  Filled 2020-02-05: qty 2.5

## 2020-02-05 NOTE — ED Provider Notes (Signed)
[facility] Emergency Department Provider Note   ____________________________________________    I have reviewed the triage vital signs and the nursing notes.   HISTORY  Chief Complaint Weakness     HPI Curtis Alexander is a 74 y.o. male with history of diabetes, urinary tract infection due to ESBL who presents today with complaints of diffuse weakness.  Patient reports with prior urinary tract infections his primary symptom has been diffuse weakness and severe fatigue.  He reports he slept 20 out of 24 hours yesterday.  He denies fevers or chills.  No abdominal pain.  No dysuria.  He has seen urology and does have plans for cystoscopy and CT imaging by Dr. Bernardo Heater for further evaluation of recurrent urinary tract infections.  Past Medical History:  Diagnosis Date  . Arthritis   . Cervical spinal stenosis   . Diabetes mellitus without complication ([place])   . Essential tremor   . GERD (gastroesophageal reflux disease)   . History of hiatal hernia    fixed with gastric bypass surgery  . History of kidney stones   . Hypertension   . Hypoglycemia   . Non-traumatic rupture of right patellar tendon 03/25/2019  . Sleep apnea    had gastric bypass and no longer uses cpap machine  . Spinal stenosis, lumbar     Patient Active Problem List   Diagnosis Date Noted  . Urinary tract infection 01/09/2020  . Non-traumatic rupture of right patellar tendon 03/25/2019  . 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 Essential tremor 03/04/2019  . Gout 03/04/2019  . Intestinal malabsorption 09/29/2018  . 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 Lumbar herniated disc 12/17/2012  . Nephrolithiasis 06/11/2012  . Anxiety disorder 03/12/2012  . Status post abdominoplasty 04/30/2007  . Absence of bladder continence 11/04/2003  . Back pain 11/04/2003  . Depression  11/04/2003  . Diabetes mellitus, type II ([place]) 11/04/2003  . Hypertension 11/04/2003  . Insomnia 11/04/2003  . Morbid obesity ([place]) 11/04/2003  . Orthopnea 11/04/2003  . Peptic ulcer disease 11/04/2003  . Sleep apnea 11/04/2003  . Venous insufficiency 11/04/2003    Past Surgical History:  Procedure Laterality Date  . FRACTURE SURGERY Left    age 79  . GASTRIC BYPASS     fixed hiatal hernia  . KNEE ARTHROPLASTY Right 03/05/2019   Procedure: COMPUTER ASSISTED TOTAL KNEE ARTHROPLASTY;  Surgeon: Dereck Leep, MD;  Location: [facility] ORS;  Service: Orthopedics;  Laterality: Right;  . PATELLAR TENDON REPAIR Right 03/28/2019   Procedure: PATELLA TENDON REPAIR;  Surgeon: Dereck Leep, MD;  Location: [facility] ORS;  Service: Orthopedics;  Laterality: Right;  . SKIN SURGERY     after gastric bypass and pt became septic and had wound vac placed  . VENA CAVA FILTER PLACEMENT     WAS PLACED DURING PTS GASTRIC BYPASS SURGERY PER PT    Prior to Admission medications   Medication Sig Start Date End Date Taking? Authorizing Provider  allopurinol (ZYLOPRIM) 300 MG tablet Take 300 mg by mouth daily with lunch.    [provider]  brimonidine (ALPHAGAN) 0.2 % ophthalmic solution Place 1 drop into the right eye 2 (two) times daily.    [provider]  Cholecalciferol (VITAMIN D) 50 MCG (2000 UT) CAPS Take 2,000 Units by mouth daily.    [provider]  clonazePAM (KLONOPIN) 0.5 MG tablet Take 0.25 mg by mouth  See admin instructions. Take 0.25 in the morning, 0.25 at lunch, 0.5 mg at bedtime    [provider]  Cranberry 250 MG TABS Take 250 mg by mouth daily.    [provider]  Cyanocobalamin (B-12) 2500 MCG TABS Take 2,500 mcg by mouth daily.    [provider]  cyclobenzaprine (FLEXERIL) 10 MG tablet Take 10 mg by mouth in the morning and at bedtime.     [provider]  ferrous sulfate 325 (65 FE) MG tablet Take 1 tablet (325 mg total) by  mouth 2 (two) times daily with a meal. Patient taking differently: Take 325 mg by mouth every other day.  03/09/19   [name], PA-C  finasteride (PROSCAR) 5 MG tablet Take 5 mg by mouth daily with lunch.    [provider]  furosemide (LASIX) 20 MG tablet Take 1 tablet (20 mg total) by mouth daily. 01/11/20 01/10/21  Rai, Vernelle Emerald, MD  gabapentin (NEURONTIN) 300 MG capsule Take 300 mg by mouth 2 (two) times daily. 12/29/19   [provider]  latanoprost (XALATAN) 0.005 % ophthalmic solution Place 1 drop into the right eye at bedtime.    [provider]  loratadine (CLARITIN) 10 MG tablet Take 10 mg by mouth daily as needed for allergies.    [provider]  mirtazapine (REMERON) 45 MG tablet Take 45 mg by mouth at bedtime.    [provider]  nystatin (MYCOSTATIN/NYSTOP) powder Apply topically 2 (two) times daily. Apply to abdominal folds /chest wall crease 01/11/20   Rai, Ripudeep K, MD  omeprazole (PRILOSEC) 40 MG capsule Take 40 mg by mouth daily with lunch.    [provider]  primidone (MYSOLINE) 50 MG tablet Take 50-75 mg by mouth See admin instructions. Take 1 tablets (75mg ) by mouth every morning and take 1 tablet (50mg ) by mouth every afternoon    [provider]  propranolol (INDERAL) 40 MG tablet Take 40 mg by mouth 2 (two) times daily.     [provider]  simvastatin (ZOCOR) 20 MG tablet Take 1 tablet by mouth every evening.    [provider]  tamsulosin (FLOMAX) 0.4 MG CAPS capsule Take 0.4 mg by mouth at bedtime.    [provider]  zolpidem (AMBIEN) 5 MG tablet Take 5 mg by mouth at bedtime.     [provider]     Allergies Patient has no known allergies.  No family history on file.  Social History Social History   Tobacco Use  . Smoking status: Never Smoker  . Smokeless tobacco: Never Used  Vaping Use  . Vaping Use: Never used  Substance Use Topics  . Alcohol use: Yes      Comment: occ 1 glass per week  . Drug use: Never    Review of Systems  Constitutional: As above Eyes: No visual changes.  ENT: No sore throat. Cardiovascular: Denies chest pain. Respiratory: Denies shortness of breath. Gastrointestinal: As above Genitourinary: As above Musculoskeletal: Negative for back pain. Skin: Negative for rash. Neurological: Negative for headaches    ____________________________________________   PHYSICAL EXAM:  VITAL SIGNS: ED Triage Vitals  Enc Vitals Group     BP 02/05/20 1425 (!) 121/59     Pulse Rate 02/05/20 1425 67     Resp 02/05/20 1425 16     Temp 02/05/20 1425 98.5 F (36.9 C)     Temp Source 02/05/20 1425 Oral     SpO2 02/05/20 1425 92 %  Weight 02/05/20 1424 131.5 kg (290 lb)     Height 02/05/20 1424 1.727 m (5\' 8" )     Head Circumference --      Peak Flow --      Pain Score 02/05/20 1448 0     Pain Loc --      Pain Edu? --      Excl. in [place]? --     Constitutional: Alert and oriented.   Mouth/Throat: Mucous membranes are moist.   Neck:  Painless ROM Cardiovascular: Normal rate, regular rhythm. Grossly normal heart sounds.  Good peripheral circulation. Respiratory: Normal respiratory effort.  No retractions. Lungs CTAB. Gastrointestinal: Soft and nontender. No distention.  No CVA tenderness.  Musculoskeletal: No lower extremity tenderness nor edema.  Warm and well perfused Neurologic:  Normal speech and language. No gross focal neurologic deficits are appreciated.  Skin:  Skin is warm, dry and intact. No rash noted. Psychiatric: Mood and affect are normal. Speech and behavior are normal.  ____________________________________________   LABS (all labs ordered are listed, but only abnormal results are displayed)  Labs Reviewed  BASIC METABOLIC PANEL - Abnormal; Notable for the following components:      Result Value   CO2 33 (*)    Glucose, Bld 118 (*)    BUN 25 (*)    Calcium 8.3 (*)    All other components  within normal limits  CBC - Abnormal; Notable for the following components:   MCHC 29.9 (*)    Platelets 134 (*)    All other components within normal limits  URINALYSIS, COMPLETE (UACMP) WITH MICROSCOPIC - Abnormal; Notable for the following components:   Color, Urine AMBER (*)    APPearance CLOUDY (*)    Hgb urine dipstick SMALL (*)    Nitrite POSITIVE (*)    Leukocytes,Ua MODERATE (*)    WBC, UA >50 (*)    Bacteria, UA MANY (*)    All other components within normal limits  URINE CULTURE  SARS CORONAVIRUS 2 BY RT PCR (HOSPITAL ORDER, [place])  CBG MONITORING, ED   ____________________________________________  EKG  ED ECG REPORT I, [place] Drafts, the attending physician, personally viewed and interpreted this ECG.  Date: 02/05/2020  Rhythm: normal sinus rhythm QRS Axis: normal Intervals: Bundle-branch block ST/T Wave abnormalities: Nonspecific changes Narrative Interpretation: no evidence of acute ischemia  ____________________________________________  RADIOLOGY  None ____________________________________________   PROCEDURES  Procedure(s) performed: No  Procedures   Critical Care performed: No ____________________________________________   INITIAL IMPRESSION / ASSESSMENT AND PLAN / ED COURSE  Pertinent labs & imaging results that were available during my care of the patient were reviewed by me and considered in my medical decision making (see chart for details).  Patient presents with fatigue and diffuse weakness as noted above.  Differential includes urinary tract infection, pneumonia.  No respiratory symptoms to suggest pneumonia.  Lab work is overall reassuring, white blood cell count is normal.  Mild elevation of BUN to creatinine ratio.  Urinalysis is consistent with urinary tract infection, urine culture sent.  Reviewed lab results, did have ESBL urine in August  We will treat with IV imipenem and admit to the  hospitalist service    ____________________________________________   FINAL CLINICAL IMPRESSION(S) / ED DIAGNOSES  Final diagnoses:  UTI due to extended-spectrum beta lactamase (ESBL) producing Escherichia coli        Note:  This document was prepared using Dragon voice recognition software and may include unintentional dictation errors.  [place] Drafts, MD 02/05/20 [phone]

## 2020-02-05 NOTE — ED Notes (Signed)
See triage note, pt reports he is here for weakness that started a week ago. States weakness is d/t recurrent UTI from e coli. Denies burning with urination, fever, frequency. Reports dark urine.  Denies being able to provide urine sample at this time

## 2020-02-05 NOTE — ED Notes (Signed)
Multiple attempt IV, IV team order placed

## 2020-02-05 NOTE — Progress Notes (Signed)
Report called to [facility] RN for patient being transferred to room 107.

## 2020-02-05 NOTE — Progress Notes (Signed)
PHARMACIST - PHYSICIAN COMMUNICATION  CONCERNING:  Enoxaparin (Lovenox) for DVT Prophylaxis    RECOMMENDATION: Patient was prescribed enoxaprin 40mg  q24 hours for VTE prophylaxis.   Filed Weights   02/05/20 1424  Weight: 131.5 kg (290 lb)    Body mass index is 44.09 kg/m.  Estimated Creatinine Clearance: 101.8 mL/min (by C-G formula based on SCr of 0.83 mg/dL).   Based on [place] patient is candidate for enoxaparin 40mg  every 12 hour dosing due to BMI being >40.   DESCRIPTION: Pharmacy has adjusted enoxaparin dose per [facility] policy.  Patient is now receiving enoxaparin 40mg  every 12 hours.    Geetika Laborde, PharmD Clinical Pharmacist  02/05/2020 8:15 PM

## 2020-02-05 NOTE — Progress Notes (Signed)
Pharmacy Antibiotic Note  Curtis Alexander is a 74 y.o. male admitted on 02/05/2020 with UTI.  Pharmacy has been consulted for Meropenem dosing.   Pt has hx of ESBL EColi infection in 02/2019.   Plan: Meropenem 1 gm IV Q8H.   Height: 5\' 8"  (172.7 cm) Weight: 131.5 kg (290 lb) IBW/kg (Calculated) : 68.4  Temp (24hrs), Avg:98.5 F (36.9 C), Min:98.5 F (36.9 C), Max:98.5 F (36.9 C)  Recent Labs  Lab 02/05/20 1450  WBC 4.3  CREATININE 0.83    Estimated Creatinine Clearance: 101.8 mL/min (by C-G formula based on SCr of 0.83 mg/dL).    No Known Allergies  Antimicrobials this admission:   >>    >>   Dose adjustments this admission:   Microbiology results:  BCx:   UCx:    Sputum:    MRSA PCR:   Thank you for allowing pharmacy to be a part of this patient's care.  Charmagne Buhl D 02/05/2020 6:31 PM

## 2020-02-05 NOTE — ED Notes (Signed)
Pt SpO2 88%, placed on 2L . Dr Corky Downs notfied

## 2020-02-05 NOTE — ED Triage Notes (Signed)
Weakness and fatigue X 2-3 days. States that he knows he has a UTI, has had recurrent UTI's over last few months.

## 2020-02-05 NOTE — Telephone Encounter (Signed)
Patient called complaining of feeling lethargic, fatigue, weak, foul smelling urine and confusion. HE did an at home test where it showed positive for UTI. As per PA Sam and Larene Beach,  Advised patient he needs to go to his closest ED. PT and daughter agreed.

## 2020-02-05 NOTE — H&P (Addendum)
History and Physical    Curtis Alexander YIF:[id number] DOB: [birth year] DOA: 02/05/2020  PCP: [name]  Patient coming from: Home  I have personally briefly reviewed patient's old medical records in [place]  Chief Complaint: fatigue since 2 days.   HPI: Curtis Alexander is a 74 y.o. male with medical history significant of essential hypertension, diabetes without complication, GERD, essential tremor, h/o renal stones, recurrent urinary tract infections, apparently this is the fifth UTI in the last 1 year since August 2020 presents to ED for fatigue 2 to 3 days, associated with nausea, foul smelling urine and mild abdominal discomfort since 2 to 3 days. . Pt denies fever, chills, sob, chest pain, vomiting, diarrhea, back pain, headache, dizziness, palpitations, leg edema, syncope, hematemesis or hematuria in the last one week . Pt reports this is how he feels whenever he has UTI.  He was recently by Dr Bernardo Heater with urology for recurrent UTI'S, and was to be scheduled for CT urogram and cystoscopy.  ED Course: on arrival to ED, pt afebrile, mildly hypoxic with oxygen sats at 88 to 90% on room air, normotensive, heart rate of 63/min and respiratory rate of 18/min. Labs show sodium of 143, bicarb of 33, BUN of 25, creatinine of 0.83, hemoglobin of 13.9, platelets of 134. COVID-19 is pending.  Urine analysis shows cloudy urine with moderate leukocytes with positive nitrites and many bacteria.  He was referred to [facility] for evaluation and management of recurrent urinary tract infection.   Review of Systems: As per HPI otherwise  "All others reviewed and are negative,"   Past Medical History:  Diagnosis Date  . Arthritis   . Cervical spinal stenosis   . Diabetes mellitus without complication ([place])   . Essential tremor   . GERD (gastroesophageal reflux disease)   . History of hiatal hernia    fixed with gastric bypass surgery  . History of kidney stones   . Hypertension   .  Hypoglycemia   . Non-traumatic rupture of right patellar tendon 03/25/2019  . Sleep apnea    had gastric bypass and no longer uses cpap machine  . Spinal stenosis, lumbar     Past Surgical History:  Procedure Laterality Date  . FRACTURE SURGERY Left    age 32  . GASTRIC BYPASS     fixed hiatal hernia  . KNEE ARTHROPLASTY Right 03/05/2019   Procedure: COMPUTER ASSISTED TOTAL KNEE ARTHROPLASTY;  Surgeon: Dereck Leep, MD;  Location: [facility] ORS;  Service: Orthopedics;  Laterality: Right;  . PATELLAR TENDON REPAIR Right 03/28/2019   Procedure: PATELLA TENDON REPAIR;  Surgeon: Dereck Leep, MD;  Location: [facility] ORS;  Service: Orthopedics;  Laterality: Right;  . SKIN SURGERY     after gastric bypass and pt became septic and had wound vac placed  . VENA CAVA FILTER PLACEMENT     WAS PLACED DURING PTS GASTRIC BYPASS SURGERY PER PT    Social History  reports that he has never smoked. He has never used smokeless tobacco. He reports current alcohol use. He reports that he does not use drugs.  No Known Allergies   Family history NO family history of BPH or Urological disorders.    Prior to Admission medications   Medication Sig Start Date End Date Taking? Authorizing Provider  allopurinol (ZYLOPRIM) 300 MG tablet Take 300 mg by mouth daily with lunch.    [provider]  brimonidine (ALPHAGAN) 0.2 % ophthalmic solution Place 1 drop into the right eye 2 (two)  times daily.    [provider]  Cholecalciferol (VITAMIN D) 50 MCG (2000 UT) CAPS Take 2,000 Units by mouth daily.    [provider]  clonazePAM (KLONOPIN) 0.5 MG tablet Take 0.25 mg by mouth See admin instructions. Take 0.25 in the morning, 0.25 at lunch, 0.5 mg at bedtime    [provider]  Cranberry 250 MG TABS Take 250 mg by mouth daily.    [provider]  Cyanocobalamin (B-12) 2500 MCG TABS Take 2,500 mcg by mouth daily.    [provider]  cyclobenzaprine (FLEXERIL) 10 MG  tablet Take 10 mg by mouth in the morning and at bedtime.     [provider]  ferrous sulfate 325 (65 FE) MG tablet Take 1 tablet (325 mg total) by mouth 2 (two) times daily with a meal. Patient taking differently: Take 325 mg by mouth every other day.  03/09/19   [name], PA-C  finasteride (PROSCAR) 5 MG tablet Take 5 mg by mouth daily with lunch.    [provider]  furosemide (LASIX) 20 MG tablet Take 1 tablet (20 mg total) by mouth daily. 01/11/20 01/10/21  Rai, Vernelle Emerald, MD  gabapentin (NEURONTIN) 300 MG capsule Take 300 mg by mouth 2 (two) times daily. 12/29/19   [provider]  latanoprost (XALATAN) 0.005 % ophthalmic solution Place 1 drop into the right eye at bedtime.    [provider]  loratadine (CLARITIN) 10 MG tablet Take 10 mg by mouth daily as needed for allergies.    [provider]  mirtazapine (REMERON) 45 MG tablet Take 45 mg by mouth at bedtime.    [provider]  nystatin (MYCOSTATIN/NYSTOP) powder Apply topically 2 (two) times daily. Apply to abdominal folds /chest wall crease 01/11/20   Rai, Ripudeep K, MD  omeprazole (PRILOSEC) 40 MG capsule Take 40 mg by mouth daily with lunch.    [provider]  primidone (MYSOLINE) 50 MG tablet Take 50-75 mg by mouth See admin instructions. Take 1 tablets (75mg ) by mouth every morning and take 1 tablet (50mg ) by mouth every afternoon    [provider]  propranolol (INDERAL) 40 MG tablet Take 40 mg by mouth 2 (two) times daily.     [provider]  simvastatin (ZOCOR) 20 MG tablet Take 1 tablet by mouth every evening.    [provider]  tamsulosin (FLOMAX) 0.4 MG CAPS capsule Take 0.4 mg by mouth at bedtime.    [provider]  zolpidem (AMBIEN) 5 MG tablet Take 5 mg by mouth at bedtime.     [provider]    Physical Exam: Vitals:   02/05/20 1424 02/05/20 1425 02/05/20 1745 02/05/20 1746  BP:  (!) 121/59    Pulse:  67  64 63  Resp:  16 (!) 22 18  Temp:  98.5 F (36.9 C)    TempSrc:  Oral    SpO2:  92% (!) 88% 92%  Weight: 131.5 kg     Height: 5\' 8"  (1.727 m)       Constitutional: NAD, calm, comfortable, no distress noted.  Vitals:   02/05/20 1424 02/05/20 1425 02/05/20 1745 02/05/20 1746  BP:  (!) 121/59    Pulse:  67 64 63  Resp:  16 (!) 22 18  Temp:  98.5 F (36.9 C)    TempSrc:  Oral    SpO2:  92% (!) 88% 92%  Weight: 131.5 kg     Height: 5\' 8"  (  1.727 m)      Eyes: PERRL, lids and conjunctivae normal ENMT: Mucous membranes are moist.  Neck: normal, supple, no masses, no thyromegaly Respiratory: clear to auscultation bilaterally ,no wheezing or rhonchi.  Cardiovascular: Regular rate and rhythm, no murmurs  TRACE LEG EDEMA.  Abdomen: no tenderness, no masses palpated. No hepatosplenomegaly. Bowel sounds positive.  Musculoskeletal: no clubbing / cyanosis.. Good ROM, no contractures. Normal muscle tone.  Skin: no rashes, lesions, ulcers. No induration Neurologic: CN 2-12 grossly intact.  Strength 5/5 in all 4.  Psychiatric:. Alert and oriented x 3. Normal mood.     Labs on Admission: I have personally reviewed following labs and imaging studies  CBC: Recent Labs  Lab 02/05/20 1450  WBC 4.3  HGB 13.9  HCT 46.5  MCV 99.8  PLT 134*    Basic Metabolic Panel: Recent Labs  Lab 02/05/20 1450  NA 143  K 3.9  CL 103  CO2 33*  GLUCOSE 118*  BUN 25*  CREATININE 0.83  CALCIUM 8.3*    GFR: Estimated Creatinine Clearance: 101.8 mL/min (by C-G formula based on SCr of 0.83 mg/dL).  Liver Function Tests: No results for input(s): AST, ALT, ALKPHOS, BILITOT, PROT, ALBUMIN in the last 168 hours.  Urine analysis:    Component Value Date/Time   COLORURINE AMBER (A) 02/05/2020 1429   APPEARANCEUR CLOUDY (A) 02/05/2020 1429   APPEARANCEUR Cloudy (A) 01/16/2020 1125   LABSPEC 1.024 02/05/2020 1429   PHURINE 5.0 02/05/2020 1429   GLUCOSEU NEGATIVE 02/05/2020 1429   HGBUR SMALL (A)  02/05/2020 1429   BILIRUBINUR NEGATIVE 02/05/2020 1429   BILIRUBINUR Negative 01/16/2020 1125   KETONESUR NEGATIVE 02/05/2020 1429   PROTEINUR NEGATIVE 02/05/2020 1429   NITRITE POSITIVE (A) 02/05/2020 1429   LEUKOCYTESUR MODERATE (A) 02/05/2020 1429    Radiological Exams on Admission: No results found.    Assessment/Plan Active Problems:   UTI (urinary tract infection)  Recurrent UTIs Would explain the foul-smelling urine, fatigue and nausea the last 2 days. Urine cultures to be sent. Patient has a history of ESBL E. coli in August 2020, hence he was started on meropenem.  Continue the same until we get cultures back Urology will be consulted in the morning for further evaluation.   Essential hypertension  Well-controlled blood pressure parameters   GERD Stable   Mild hypoxia requiring 1 to 2 L of oxygen to keep sats greater than 90% Patient denies any respiratory symptoms Will get chest x-ray for further evaluation.   History of diabetes mellitus  Last A1c at 4.8 Patient currently is not on any diabetic medication.   Essential tremor Continue with propranolol.   Chronic diastolic heart failure Patient appears to be compensated though he is slightly hypoxic on room air. He denies any chest pain, shortness of breath or leg edema. Last echocardiogram in August 2021 showed left ventricular ejection fraction of 55 to 60% with no regional wall motion abnormalities.  Left ventricular diastolic parameters consistent with grade 1 diastolic dysfunction with impaired relaxation. Patient was recently started on Lasix 20 mg daily. Continue the same.   BPH?  ON flomax and proscar.   Body mass index is 44.09 kg/m. Morbid obesity    Sleep apnea Not on CPAP at home.     DVT prophylaxis: LOVENOX.  Code Status:   FULL CODE.  Family Communication:  FAMILY AT BEDSIDE.  Disposition Plan:   Patient is from:  HOME.   Anticipated DC to:  HOME  Anticipated DC  date:  02/06/2020  Anticipated DC barriers: UTI Consults called:  NONE.  Admission status:  OBSERVATION/MED SURG.   Severity of Illness: The appropriate patient status for this patient is OBSERVATION. Observation status is judged to be reasonable and necessary in order to provide the required intensity of service to ensure the patient's safety. The patient's presenting symptoms, physical exam findings, and initial radiographic and laboratory data in the context of their medical condition is felt to place them at decreased risk for further clinical deterioration. Furthermore, it is anticipated that the patient will be medically stable for discharge from the hospital within 2 midnights of admission.      Hosie Poisson MD [facility]  How to contact the [facility] Attending or Consulting provider [place] or covering provider during after hours [place], for this patient?   1. Check the care team in [facility] and look for a) attending/consulting [facility] provider listed and b) the [facility] team listed 2. Log into [email or web address] and use [place] universal password to access. If you do not have the password, please contact the hospital operator. 3. Locate the [facility] provider you are looking for under [facility] and page to a number that you can be directly reached. 4. If you still have difficulty reaching the provider, please page the [facility] (Director on Call) for the Hospitalists listed on amion for assistance.  02/05/2020, 6:07 PM

## 2020-02-06 DIAGNOSIS — E785 Hyperlipidemia, unspecified: Secondary | ICD-10-CM | POA: Diagnosis present

## 2020-02-06 DIAGNOSIS — D696 Thrombocytopenia, unspecified: Secondary | ICD-10-CM | POA: Diagnosis present

## 2020-02-06 DIAGNOSIS — G4700 Insomnia, unspecified: Secondary | ICD-10-CM | POA: Diagnosis present

## 2020-02-06 DIAGNOSIS — I110 Hypertensive heart disease with heart failure: Secondary | ICD-10-CM | POA: Diagnosis present

## 2020-02-06 DIAGNOSIS — Z9884 Bariatric surgery status: Secondary | ICD-10-CM | POA: Diagnosis not present

## 2020-02-06 DIAGNOSIS — I5032 Chronic diastolic (congestive) heart failure: Secondary | ICD-10-CM | POA: Diagnosis present

## 2020-02-06 DIAGNOSIS — F419 Anxiety disorder, unspecified: Secondary | ICD-10-CM | POA: Diagnosis present

## 2020-02-06 DIAGNOSIS — N3000 Acute cystitis without hematuria: Secondary | ICD-10-CM | POA: Diagnosis not present

## 2020-02-06 DIAGNOSIS — Z1612 Extended spectrum beta lactamase (ESBL) resistance: Secondary | ICD-10-CM | POA: Diagnosis present

## 2020-02-06 DIAGNOSIS — Z79899 Other long term (current) drug therapy: Secondary | ICD-10-CM | POA: Diagnosis not present

## 2020-02-06 DIAGNOSIS — I10 Essential (primary) hypertension: Secondary | ICD-10-CM | POA: Diagnosis not present

## 2020-02-06 DIAGNOSIS — E119 Type 2 diabetes mellitus without complications: Secondary | ICD-10-CM | POA: Diagnosis present

## 2020-02-06 DIAGNOSIS — N390 Urinary tract infection, site not specified: Secondary | ICD-10-CM | POA: Diagnosis present

## 2020-02-06 DIAGNOSIS — D3501 Benign neoplasm of right adrenal gland: Secondary | ICD-10-CM | POA: Diagnosis present

## 2020-02-06 DIAGNOSIS — B9620 Unspecified Escherichia coli [E. coli] as the cause of diseases classified elsewhere: Secondary | ICD-10-CM | POA: Diagnosis present

## 2020-02-06 DIAGNOSIS — K219 Gastro-esophageal reflux disease without esophagitis: Secondary | ICD-10-CM | POA: Diagnosis present

## 2020-02-06 DIAGNOSIS — Z6841 Body Mass Index (BMI) 40.0 and over, adult: Secondary | ICD-10-CM | POA: Diagnosis not present

## 2020-02-06 DIAGNOSIS — R0902 Hypoxemia: Secondary | ICD-10-CM | POA: Diagnosis present

## 2020-02-06 DIAGNOSIS — G250 Essential tremor: Secondary | ICD-10-CM | POA: Diagnosis present

## 2020-02-06 DIAGNOSIS — Z96651 Presence of right artificial knee joint: Secondary | ICD-10-CM | POA: Diagnosis present

## 2020-02-06 DIAGNOSIS — M109 Gout, unspecified: Secondary | ICD-10-CM | POA: Diagnosis present

## 2020-02-06 DIAGNOSIS — Z20822 Contact with and (suspected) exposure to covid-19: Secondary | ICD-10-CM | POA: Diagnosis present

## 2020-02-06 LAB — COMPREHENSIVE METABOLIC PANEL
ALT: 12 U/L (ref 0–44)
AST: 17 U/L (ref 15–41)
Albumin: 2.9 g/dL — ABNORMAL LOW (ref 3.5–5.0)
Alkaline Phosphatase: 69 U/L (ref 38–126)
Anion gap: 9 (ref 5–15)
BUN: 21 mg/dL (ref 8–23)
CO2: 34 mmol/L — ABNORMAL HIGH (ref 22–32)
Calcium: 8.2 mg/dL — ABNORMAL LOW (ref 8.9–10.3)
Chloride: 100 mmol/L (ref 98–111)
Creatinine, Ser: 0.66 mg/dL (ref 0.61–1.24)
GFR calc Af Amer: >60 mL/min (ref >60)
GFR calc non Af Amer: >60 mL/min (ref >60)
Glucose, Bld: 97 mg/dL (ref 70–99)
Potassium: 3.8 mmol/L (ref 3.5–5.1)
Sodium: 143 mmol/L (ref 135–145)
Total Bilirubin: 0.6 mg/dL (ref 0.3–1.2)
Total Protein: 5.5 g/dL — ABNORMAL LOW (ref 6.5–8.1)

## 2020-02-06 LAB — CBC
HCT: 40.2 % (ref 39.0–52.0)
Hemoglobin: 12.1 g/dL — ABNORMAL LOW (ref 13.0–17.0)
MCH: 29.9 pg (ref 26.0–34.0)
MCHC: 30.1 g/dL (ref 30.0–36.0)
MCV: 99.3 fL (ref 80.0–100.0)
Platelets: 115 10*3/uL — ABNORMAL LOW (ref 150–400)
RBC: 4.05 MIL/uL — ABNORMAL LOW (ref 4.22–5.81)
RDW: 13.8 % (ref 11.5–15.5)
WBC: 5.1 10*3/uL (ref 4.0–10.5)
nRBC: 0.4 % — ABNORMAL HIGH (ref 0.0–0.2)

## 2020-02-06 MED ORDER — CLONAZEPAM 0.5 MG PO TABS
0.2500 mg | ORAL_TABLET | Freq: Two times a day (BID) | ORAL | Status: DC
  Administered 2020-02-06 – 2020-02-12 (×14): 0.25 mg via ORAL
  Filled 2020-02-06 (×13): qty 1

## 2020-02-06 MED ORDER — PROPRANOLOL HCL 40 MG PO TABS
40.0000 mg | ORAL_TABLET | Freq: Two times a day (BID) | ORAL | Status: DC
  Administered 2020-02-06 – 2020-02-12 (×12): 40 mg via ORAL
  Filled 2020-02-06 (×16): qty 1

## 2020-02-06 NOTE — Plan of Care (Signed)

## 2020-02-06 NOTE — Progress Notes (Signed)
PROGRESS NOTE    Curtis Alexander  HBZ:[id number] DOB: 1945-11-22 DOA: 02/05/2020 PCP: [name]    Chief Complaint  Patient presents with   Weakness    Brief Narrative:   Curtis Alexander is a 74 y.o. male with medical history significant of essential hypertension, diabetes without complication, GERD, essential tremor, h/o renal stones, recurrent urinary tract infections, apparently this is the fifth UTI in the last 1 year since August 2020 presents to ED for fatigue 2 to 3 days, associated with nausea, foul smelling urine and mild abdominal discomfort since 2 to 3 days. He was recently by Dr Bernardo Heater with urology for recurrent UTI'S, and was to be scheduled for CT urogram and cystoscopy. Urine analysis shows cloudy urine with moderate leukocytes with positive nitrites and many bacteria.  He was referred to [facility] for evaluation and management of recurrent urinary tract infection.  He was started on Meropenam and urine cultures sent .   Assessment & Plan:   Active Problems:   UTI (urinary tract infection)     Recurrent urinary tract infection Started him on meropenem.  Pt reports his nausea is resolved.  But still feels fatigued. No fever or chills.  Urine cultures are pending.  Dr Ashok Cordia aware of patient's admission.  No cystoscopy while he is having active infection.     GERD Stable.    Essential hypertension:  Well controlled.     DM:  Not on any meds.     Chronic diastolic heart failure Patient appears to be compensated at this time.  He denies any shortness of breath or worsening pedal edema. Last echocardiogram shows left ventricular ejection fraction of 55 to 60% with no regional wall abnormalities.  Left ventricle diastolic parameters consistent with grade 1 diastolic dysfunction with impaired relaxation.  Continue with strict intake and output and daily weights and continue with Lasix 20 mg daily.   DVT prophylaxis:Lovenox.  Code Status:  Full code.    Family Communication:  none at bedside Disposition:   Status is: Inpatient.  The patient will require care spanning > 2 midnights and should be moved to inpatient because: IV treatments appropriate due to intensity of illness or inability to take PO  Dispo: The patient is from: Home              Anticipated d/c is to: Pending              Anticipated d/c date is: 2 days              Patient currently is not medically stable to d/c.       Consultants:   None   Procedures: none.   Antimicrobials:  Anti-infectives (From admission, onward)   Start     Dose/Rate Route Frequency Ordered Stop   02/05/20 1900  meropenem (MERREM) 1 g in sodium chloride 0.9 % 100 mL IVPB     Discontinue     1 g 200 mL/hr over 30 Minutes Intravenous Every 8 hours 02/05/20 1831     02/05/20 1730  meropenem (MERREM) 1 g in sodium chloride 0.9 % 100 mL IVPB  Status:  Discontinued        1 g 200 mL/hr over 30 Minutes Intravenous Every 8 hours 02/05/20 1729 02/05/20 1831         Subjective: Reports feeling fatigued, no  Nausea, vomiting or abdominal pan.  Wanted me to change the diet to regular diet.   Objective: Vitals:   02/05/20 2100 02/06/20 0044  02/06/20 0752 02/06/20 1139  BP:  136/66 112/65 126/63  Pulse:  88 63 71  Resp: 16 16 18 15   Temp: 98.7 F (37.1 C) 98 F (36.7 C) 97.7 F (36.5 C) 98 F (36.7 C)  TempSrc: Oral Oral Oral   SpO2:  95% 90% 95%  Weight:      Height:        Intake/Output Summary (Last 24 hours) at 02/06/2020 1231 Last data filed at 02/06/2020 0905 Gross per 24 hour  Intake 1070 ml  Output 975 ml  Net 95 ml   Filed Weights   02/05/20 1424  Weight: 131.5 kg    Examination:  General exam: Appears calm and comfortable  Respiratory system: Clear to auscultation. Respiratory effort normal. Cardiovascular system: S1 & S2 heard, RRR. No JVD No pedal edema. Gastrointestinal system: Abdomen is nondistended, soft and nontender. . Normal bowel sounds  heard. Central nervous system: Alert and oriented. No focal neurological deficits. Extremities: Symmetric 5 x 5 power. Skin: No rashes, lesions or ulcers Psychiatry: Mood & affect appropriate.      Data Reviewed: I have personally reviewed following labs and imaging studies  CBC: Recent Labs  Lab 02/05/20 1450 02/06/20 0430  WBC 4.3 5.1  HGB 13.9 12.1*  HCT 46.5 40.2  MCV 99.8 99.3  PLT 134* 115*    Basic Metabolic Panel: Recent Labs  Lab 02/05/20 1450 02/06/20 0430  NA 143 143  K 3.9 3.8  CL 103 100  CO2 33* 34*  GLUCOSE 118* 97  BUN 25* 21  CREATININE 0.83 0.66  CALCIUM 8.3* 8.2*    GFR: Estimated Creatinine Clearance: 105.6 mL/min (by C-G formula based on SCr of 0.66 mg/dL).  Liver Function Tests: Recent Labs  Lab 02/06/20 0430  AST 17  ALT 12  ALKPHOS 69  BILITOT 0.6  PROT 5.5*  ALBUMIN 2.9*    CBG: No results for input(s): GLUCAP in the last 168 hours.   Recent Results (from the past 240 hour(s))  SARS Coronavirus 2 by RT PCR (hospital order, performed in [facility] hospital lab) Nasopharyngeal Nasopharyngeal Swab     Status: None   Collection Time: 02/05/20  5:48 PM   Specimen: Nasopharyngeal Swab  Result Value Ref Range Status   SARS Coronavirus 2 NEGATIVE NEGATIVE Final    Comment: (NOTE) SARS-CoV-2 target nucleic acids are NOT DETECTED.  The SARS-CoV-2 RNA is generally detectable in upper and lower respiratory specimens during the acute phase of infection. The lowest concentration of SARS-CoV-2 viral copies this assay can detect is 250 copies / mL. A negative result does not preclude SARS-CoV-2 infection and should not be used as the sole basis for treatment or other patient management decisions.  A negative result may occur with improper specimen collection / handling, submission of specimen other than nasopharyngeal swab, presence of viral mutation(s) within the areas targeted by this assay, and inadequate number of viral  copies (<250 copies / mL). A negative result must be combined with clinical observations, patient history, and epidemiological information.  Fact Sheet for Patients:   StrictlyIdeas.no  Fact Sheet for Healthcare Providers: BankingDealers.co.za  This test is not yet approved or  cleared by the [place] FDA and has been authorized for detection and/or diagnosis of SARS-CoV-2 by FDA under an Emergency Use Authorization (EUA).  This EUA will remain in effect (meaning this test can be used) for the duration of the COVID-19 declaration under Section 564(b)(1) of the Act, 21 U.S.C. section 360bbb-3(b)(1), unless the  authorization is terminated or revoked sooner.  Performed at [facility], [address], [place], [place] [address]          Radiology Studies: DG Chest 2 View  Result Date: 02/05/2020 CLINICAL DATA:  Weakness, fatigue, urinary tract infection EXAM: CHEST - 2 VIEW COMPARISON:  01/09/2020 FINDINGS: Lung volumes are small, but are stable since prior examination. Unchanged mild elevation of the right hemidiaphragm. Lungs are clear. No pneumothorax or pleural effusion. Cardiac size is within normal limits. The thoracic aorta appears ectatic, unchanged from prior examination. No acute bone abnormality. IMPRESSION: No active cardiopulmonary disease. Electronically Signed   By: Fidela Salisbury MD   On: 02/05/2020 18:58        Scheduled Meds:  allopurinol  300 mg Oral Q lunch   brimonidine  1 drop Right Eye BID   clonazePAM  0.25 mg Oral BID WC   clonazePAM  0.5 mg Oral QHS   enoxaparin (LOVENOX) injection  40 mg Subcutaneous Q12H   ferrous sulfate  325 mg Oral QODAY   finasteride  5 mg Oral Q lunch   furosemide  20 mg Oral Daily   gabapentin  300 mg Oral BID   latanoprost  1 drop Right Eye QHS   mirtazapine  45 mg Oral QHS   pantoprazole  40 mg Oral Daily   primidone  50 mg Oral q1600    primidone  75 mg Oral q AM   propranolol  40 mg Oral BID   simvastatin  20 mg Oral QPM   tamsulosin  0.4 mg Oral QHS   vitamin B-12  2,500 mcg Oral Daily   zolpidem  5 mg Oral QHS   Continuous Infusions:  meropenem (MERREM) IV 1 g (02/06/20 1108)     LOS: 0 days        Hosie Poisson, MD [facility]   To contact the attending provider between 7A-7P or the covering provider during after hours 7P-7A, please log into the web site [email or web address] and access using universal [place] password for that web site. If you do not have the password, please call the hospital operator.  02/06/2020, 12:31 PM

## 2020-02-06 NOTE — Progress Notes (Signed)
Pharmacy Antibiotic Note  Curtis Alexander is a 74 y.o. male admitted on 02/05/2020 with UTI.  Pharmacy has been consulted for Meropenem dosing.   Pt has hx of ESBL EColi infection in 02/2019.   Plan: Continue Meropenem 1 g IV Q8H.   Height: 5\' 8"  (172.7 cm) Weight: 131.5 kg (290 lb) IBW/kg (Calculated) : 68.4  Temp (24hrs), Avg:98.3 F (36.8 C), Min:97.7 F (36.5 C), Max:98.7 F (37.1 C)  Recent Labs  Lab 02/05/20 1450 02/06/20 0430  WBC 4.3 5.1  CREATININE 0.83 0.66    Estimated Creatinine Clearance: 105.6 mL/min (by C-G formula based on SCr of 0.66 mg/dL).    No Known Allergies  Antimicrobials this admission: Meropenem 7/15 >>  Dose adjustments this admission:   Microbiology results:  UCx:  Collected    Thank you for allowing pharmacy to be a part of this patient's care.  Rocky Morel 02/06/2020 10:26 AM

## 2020-02-07 NOTE — Progress Notes (Signed)
PROGRESS NOTE    Curtis Alexander  GLO:[id number] DOB: 1946/05/01 DOA: 02/05/2020 PCP: [name]    Chief Complaint  Patient presents with  . Weakness    Brief Narrative:   Curtis Alexander is a 74 y.o. male with medical history significant of essential hypertension, diabetes without complication, GERD, essential tremor, h/o renal stones, recurrent urinary tract infections, apparently this is the fifth UTI in the last 1 year since August 2020 presents to ED for fatigue 2 to 3 days, associated with nausea, foul smelling urine and mild abdominal discomfort since 2 to 3 days. He was recently by Dr Bernardo Heater with urology for recurrent UTI'S, and was to be scheduled for CT urogram and cystoscopy. Urine analysis shows cloudy urine with moderate leukocytes with positive nitrites and many bacteria.  He was referred to [facility] for evaluation and management of recurrent urinary tract infection.  He was started on Meropenam and urine cultures sent .   Assessment & Plan:   Active Problems:   UTI (urinary tract infection)     Recurrent urinary tract infection Started him on meropenem. Urine cultures are pending. Pt denies nausea, abd pain or discomfort. CT of the pelvis for evaluation of hematuria.   Dr Ashok Cordia aware of patient's admission.  No cystoscopy while he is having active infection.     GERD Stable.    Essential hypertension:  Well controlled.     DM:  Not on any meds.     Chronic diastolic heart failure Patient appears to be compensated at this time.  He denies any shortness of breath or worsening pedal edema. Last echocardiogram shows left ventricular ejection fraction of 55 to 60% with no regional wall abnormalities.  Left ventricle diastolic parameters consistent with grade 1 diastolic dysfunction with impaired relaxation.  Continue with strict intake and output and daily weights and continue with Lasix 20 mg daily.   Mild thrombocytopenia:  No bleeding seen.  ?  From antibiotics.   Hypoxia on admission:  Possibly from sleep apnea,  weaned of oxygen.  CXR wnl.  Currently on RA with good sats.    DVT prophylaxis:Lovenox.  Code Status:  Full code.  Family Communication:  none at bedside Disposition:   Status is: Inpatient.  The patient will require care spanning > 2 midnights and should be moved to inpatient because: IV treatments appropriate due to intensity of illness or inability to take PO  Dispo: The patient is from: Home              Anticipated d/c is to: Pending              Anticipated d/c date is: 2 days              Patient currently is not medically stable to d/c.       Consultants:   None   Procedures: none.   Antimicrobials:  Anti-infectives (From admission, onward)   Start     Dose/Rate Route Frequency Ordered Stop   02/05/20 1900  meropenem (MERREM) 1 g in sodium chloride 0.9 % 100 mL IVPB     Discontinue     1 g 200 mL/hr over 30 Minutes Intravenous Every 8 hours 02/05/20 1831     02/05/20 1730  meropenem (MERREM) 1 g in sodium chloride 0.9 % 100 mL IVPB  Status:  Discontinued        1 g 200 mL/hr over 30 Minutes Intravenous Every 8 hours 02/05/20 1729 02/05/20 1831  Subjective: No chest pain or sob, nausea, vomiting.   Objective: Vitals:   02/06/20 1953 02/07/20 0109 02/07/20 0437 02/07/20 0814  BP: (!) 122/55 118/64 134/71 129/74  Pulse: 66 68 66 61  Resp: 16 17  16   Temp: 98.6 F (37 C) 98.6 F (37 C) 98.6 F (37 C) 97.8 F (36.6 C)  TempSrc: Oral Oral Oral Oral  SpO2: 95% (!) 89% 94% 90%  Weight:      Height:        Intake/Output Summary (Last 24 hours) at 02/07/2020 0935 Last data filed at 02/07/2020 0804 Gross per 24 hour  Intake 562.4 ml  Output 1775 ml  Net -1212.6 ml   Filed Weights   02/05/20 1424  Weight: 131.5 kg    Examination:  General exam: well developed gentleman not in distress.  Respiratory system: clear to auscultation, no wheezing or rhonchi.    Cardiovascular system: S1s2 RRR, no JVD,  Gastrointestinal system: Abdomen is soft, non tender non distended, bowel sounds wnl.  Central nervous system: alert and oriented, grossly non focal.  Extremities: trace pedal edema.  Skin: No rashes.  Psychiatry: Mood is appropriate.      Data Reviewed: I have personally reviewed following labs and imaging studies  CBC: Recent Labs  Lab 02/05/20 1450 02/06/20 0430  WBC 4.3 5.1  HGB 13.9 12.1*  HCT 46.5 40.2  MCV 99.8 99.3  PLT 134* 115*    Basic Metabolic Panel: Recent Labs  Lab 02/05/20 1450 02/06/20 0430  NA 143 143  K 3.9 3.8  CL 103 100  CO2 33* 34*  GLUCOSE 118* 97  BUN 25* 21  CREATININE 0.83 0.66  CALCIUM 8.3* 8.2*    GFR: Estimated Creatinine Clearance: 105.6 mL/min (by C-G formula based on SCr of 0.66 mg/dL).  Liver Function Tests: Recent Labs  Lab 02/06/20 0430  AST 17  ALT 12  ALKPHOS 69  BILITOT 0.6  PROT 5.5*  ALBUMIN 2.9*    CBG: No results for input(s): GLUCAP in the last 168 hours.   Recent Results (from the past 240 hour(s))  Urine culture     Status: Abnormal (Preliminary result)   Collection Time: 02/05/20  2:29 PM   Specimen: Urine, Random  Result Value Ref Range Status   Specimen Description   Final    URINE, RANDOM Performed at [facility], [address], [place], [place] [address]    Special Requests   Final    NONE Performed at [facility], [address] [place], [place] [address]    Culture >=100,000 COLONIES/mL GRAM NEGATIVE RODS (A)  Final   Report Status PENDING  Incomplete  SARS Coronavirus 2 by RT PCR (hospital order, performed in [place] hospital lab) Nasopharyngeal Nasopharyngeal Swab     Status: None   Collection Time: 02/05/20  5:48 PM   Specimen: Nasopharyngeal Swab  Result Value Ref Range Status   SARS Coronavirus 2 NEGATIVE NEGATIVE Final    Comment: (NOTE) SARS-CoV-2 target nucleic acids are NOT DETECTED.  The SARS-CoV-2 RNA is  generally detectable in upper and lower respiratory specimens during the acute phase of infection. The lowest concentration of SARS-CoV-2 viral copies this assay can detect is 250 copies / mL. A negative result does not preclude SARS-CoV-2 infection and should not be used as the sole basis for treatment or other patient management decisions.  A negative result may occur with improper specimen collection / handling, submission of specimen other than nasopharyngeal swab, presence of  viral mutation(s) within the areas targeted by this assay, and inadequate number of viral copies (<250 copies / mL). A negative result must be combined with clinical observations, patient history, and epidemiological information.  Fact Sheet for Patients:   StrictlyIdeas.no  Fact Sheet for Healthcare Providers: BankingDealers.co.za  This test is not yet approved or  cleared by the [place] FDA and has been authorized for detection and/or diagnosis of SARS-CoV-2 by FDA under an Emergency Use Authorization (EUA).  This EUA will remain in effect (meaning this test can be used) for the duration of the COVID-19 declaration under Section 564(b)(1) of the Act, 21 U.S.C. section 360bbb-3(b)(1), unless the authorization is terminated or revoked sooner.  Performed at [facility], [address], [place], [place] [address]          Radiology Studies: DG Chest 2 View  Result Date: 02/05/2020 CLINICAL DATA:  Weakness, fatigue, urinary tract infection EXAM: CHEST - 2 VIEW COMPARISON:  01/09/2020 FINDINGS: Lung volumes are small, but are stable since prior examination. Unchanged mild elevation of the right hemidiaphragm. Lungs are clear. No pneumothorax or pleural effusion. Cardiac size is within normal limits. The thoracic aorta appears ectatic, unchanged from prior examination. No acute bone abnormality. IMPRESSION: No active cardiopulmonary disease.  Electronically Signed   By: Fidela Salisbury MD   On: 02/05/2020 18:58        Scheduled Meds: . allopurinol  300 mg Oral Q lunch  . brimonidine  1 drop Right Eye BID  . clonazePAM  0.25 mg Oral BID WC  . clonazePAM  0.5 mg Oral QHS  . enoxaparin (LOVENOX) injection  40 mg Subcutaneous Q12H  . ferrous sulfate  325 mg Oral QODAY  . finasteride  5 mg Oral Q lunch  . furosemide  20 mg Oral Daily  . gabapentin  300 mg Oral BID  . latanoprost  1 drop Right Eye QHS  . mirtazapine  45 mg Oral QHS  . pantoprazole  40 mg Oral Daily  . primidone  50 mg Oral q1600  . primidone  75 mg Oral q AM  . propranolol  40 mg Oral BID  . simvastatin  20 mg Oral QPM  . tamsulosin  0.4 mg Oral QHS  . vitamin B-12  2,500 mcg Oral Daily  . zolpidem  5 mg Oral QHS   Continuous Infusions: . meropenem (MERREM) IV 1 g (02/07/20 0431)     LOS: 1 day        Hosie Poisson, MD [facility]   To contact the attending provider between 7A-7P or the covering provider during after hours 7P-7A, please log into the web site [email or web address] and access using universal [place] password for that web site. If you do not have the password, please call the hospital operator.  02/07/2020, 9:35 AM

## 2020-02-07 NOTE — Evaluation (Signed)
Physical Therapy Evaluation Patient Details Name: Curtis Alexander MRN: [id number] DOB: 02/05/1946 Today's Date: 02/07/2020   History of Present Illness  Curtis Alexander is a 74 y.o. male with medical history significant of essential hypertension, diabetes without complication, GERD, essential tremor, h/o renal stones, recurrent urinary tract infections who presented from home to hospital ED 02/05/2020 with symptoms he reports is how he he feels when he has a UTI, apparently this is the fifth UTI in the last 1 year since August 2020.    Clinical Impression  Patient is alert and oriented and able to provide a detailed history. Patient lives at home with his daugher and granddaughter (82 y.o). He has been having a lot of difficulty with mobility for at least the last year. He initially had R knee surgery last August, then ruptured R patellar tendon with subsequent surgical repair in September. He reports he was able to ambulate 30 feet with RW when he left rehab but has been in and out of the hospital with 5 UTIs since the surgery. Most recently prior to current onset of UTI symptoms, he had been working with HHPT and was up to ambulating 17 feet with RW and w/c follow. He was seeing PT 2x a week but was about to go to 1x a week. Patient has a rolling chair he has set up so his daughter can push him into the bathroom where he takes sponge baths at the sink and can transfer himself to/from commode for BMs. He states he can dress if his daughter sets out his clothing. He appears to be dependent on her for all housework, food prep, etc. Recently she has had some health problems including severe shortness of breath and has been unable to push him in his w/c. His main mode of mobility is being pushed in manual w/c. He describes working on ambulation daily at home and completing exercises his HHPT gave him. He sleeps in his lazy boy lift chair due to not being able to use the bed.  Upon evaluation, patient was able  to go supine to sit with mod I and use of bedrails and extra time. Patient lightheaded for several minutes upon sitting (states this is normal for him). IV site found to be distended so nursing consulted and removed IV before continuing. Patient unable to perform transfers from low bed but could transfer bed to chair with CGA - supervision with bed elevated to mid thigh and chair <> chair with heavy UE use on bariatric RW. Patient ambulated a few feet with very slow pace and crouched position using heavy BUE support on RW and extremely close chair follow with quick fatigue and sudden need to sit after up to 10 feet. Patient appears to have experienced a decrease in functional mobility and independence as well as decreased caregiver ability and would benefit from short term rehab prior to returning home. Patient would benefit from skilled physical therapy to address impairments and functional limitations (see PT Problem List below) to work towards stated goals and return to PLOF or maximal functional independence.       Follow Up Recommendations SNF;Supervision for mobility/OOB    Equipment Recommendations  None recommended by PT    Recommendations for Other Services       Precautions / Restrictions Precautions Precautions: Fall Restrictions Weight Bearing Restrictions: No      Mobility  Bed Mobility Overal bed mobility: Needs Assistance Bed Mobility: Supine to Sit     Supine to sit: HOB elevated;Modified  independent (Device/Increase time)     General bed mobility comments: able to come supine to sit with extra time and effort while head of bed is elevated.  Transfers Overall transfer level: Needs assistance Equipment used: Rolling walker (2 wheeled) (bariactric size) Transfers: Sit to/from Stand Sit to Stand: Min guard;From elevated surface         General transfer comment: Patient unable to rise from bed a lower level. Required bed to be mid thigh high then able to transfer x  4 reps bed<>bed, bed to chair, chair <> chair x 2 using bariatric RW and min guard to supervision. Heavy use of BUE and unable to fully extend hips or knees.  Ambulation/Gait Ambulation/Gait assistance: Supervision;Min assist Gait Distance (Feet): 10 Feet (5+10) Assistive device: Rolling walker (2 wheeled) (Bariatric) Gait Pattern/deviations: Trunk flexed;Decreased stride length;Step-to pattern Gait velocity: very slow.   General Gait Details: Patient able to ambulate with very heavy use of B UE on RW in crouched position due to inability to fully extend knees or hips. Requires very close chair follow due to quick fatigue and lack of ability to continue standing when fatigue hits.  Stairs            Wheelchair Mobility    Modified Rankin (Stroke Patients Only)       Balance Overall balance assessment: Needs assistance Sitting-balance support: Feet unsupported;No upper extremity supported Sitting balance-Leahy Scale: Good Sitting balance - Comments: steady sitting edge of bed   Standing balance support: Bilateral upper extremity supported;During functional activity Standing balance-Leahy Scale: Fair Standing balance comment: Patient unable to stand without heavy reliance on RW with BUE. Is unable to maintain balance once he fatigues and requires close chair follow during ambulation.                             Pertinent Vitals/Pain Pain Assessment: No/denies pain    Home Living Family/patient expects to be discharged to:: Private residence Living Arrangements: Children;Other relatives (Daughter and granddaughter (67 yo). Daughter has been having health problems where she cannot push pt in w/c) Available Help at Discharge: Family;Available 24 hours/day Type of Home: House Home Access: Ramped entrance     Home Layout: Two level;Able to live on main level with bedroom/bathroom Home Equipment: Gilford Rile - 2 wheels;Bedside commode;Shower seat;Grab bars -  toilet;Wheelchair - manual (lift chair; hospital bed that was his wife's is at his home but daughter is using it currently; also has some sort of chair that wheels into bathroom (narrow enough)) Additional Comments: All living space is in the upper floor. Pt enters this top floor from car port. Can pull himself up from toilet.    Prior Function Level of Independence: Needs assistance   Gait / Transfers Assistance Needed: Pt reports being able to walk 30 feet with bariatric RW when he left rehab earlier in the year. He can walk up to 17 feet with HHPT w/c follow. sleeps in lazy boy lift chair.  ADL's / Homemaking Assistance Needed: Has had HHPT 2x a week. No other care last time d/c from hospital. Daughter assists pt with meal prep, household chores, driving, etc. Pt bathe and dresses with set up. Requires assistance to get into bathroom to transfer to commode for BM. Uses urinal otherwise.  Comments: Patient reports he had R knee surgery last Aug and then a patellar tendon rupture on the same knee in September. He went to rehab and was able to walk  30 feet with the RW. He has had 5 UTIs since that surgery. Before he had the symptoms this time, he was able to ambulate 17-18 feet in his home using the walker with the home PT following him with a w/c. He also has spinal stenosis. Sleeps in lazy boy chair.     Hand Dominance   Dominant Hand: Left    Extremity/Trunk Assessment   Upper Extremity Assessment Upper Extremity Assessment: Generalized weakness (fatigues quickly when holding up body weight on RW)    Lower Extremity Assessment Lower Extremity Assessment: LLE deficits/detail;RLE deficits/detail RLE Deficits / Details: significant lack of knee extension, Hx of patellar rupture. LLE Deficits / Details: significant lack of knee extension. painful    Cervical / Trunk Assessment Cervical / Trunk Assessment: Kyphotic  Communication   Communication: No difficulties  Cognition  Arousal/Alertness: Awake/alert Behavior During Therapy: WFL for tasks assessed/performed Overall Cognitive Status: Within Functional Limits for tasks assessed                                        General Comments General comments (skin integrity, edema, etc.): Scabbing noted B LE; IV site appeared distended so nursing informed who came and removed IV due to it apparently not successfully patent with vessel.    Exercises Other Exercises Other Exercises: Educated pt on role of PT in  acute care setting. Educated on D/C reccomendations. set up lunch tray.   Assessment/Plan    PT Assessment Patient needs continued PT services  PT Problem List Decreased strength;Decreased range of motion;Decreased activity tolerance;Decreased balance;Obesity;Decreased mobility;Decreased skin integrity       PT Treatment Interventions DME instruction;Balance training;Gait training;Neuromuscular re-education;Cognitive remediation;Functional mobility training;Patient/family education;Therapeutic activities;Therapeutic exercise;Wheelchair mobility training    PT Goals (Current goals can be found in the Care Plan section)  Acute Rehab PT Goals Patient Stated Goal: stop getting UTIs and improve mobility PT Goal Formulation: With patient Time For Goal Achievement: 02/21/20 Potential to Achieve Goals: Fair    Frequency Min 2X/week   Barriers to discharge Inaccessible home environment;Decreased caregiver support patient may need additional physical support for mobility at home that family is currently unable to provide. Home is difficult to navigate in the bathroom.    Co-evaluation               AM-PAC PT "6 Clicks" Mobility  Outcome Measure Help needed turning from your back to your side while in a flat bed without using bedrails?: None Help needed moving from lying on your back to sitting on the side of a flat bed without using bedrails?: A Little Help needed moving to and from a  bed to a chair (including a wheelchair)?: A Little Help needed standing up from a chair using your arms (e.g., wheelchair or bedside chair)?: A Lot Help needed to walk in hospital room?: A Lot Help needed climbing 3-5 steps with a railing? : Total 6 Click Score: 15    End of Session Equipment Utilized During Treatment: Gait belt Activity Tolerance: Patient limited by fatigue Patient left: in chair;with call bell/phone within reach;with chair alarm set Nurse Communication: Mobility status PT Visit Diagnosis: Muscle weakness (generalized) (M62.81);Difficulty in walking, not elsewhere classified (R26.2)    Time: 1225-1310 PT Time Calculation (min) (ACUTE ONLY): 45 min   Charges:   PT Evaluation $PT Eval Moderate Complexity: 1 Mod PT Treatments $Gait Training: 8-22 mins $Therapeutic Activity: 8-22  mins        Everlean Alstrom. Graylon Good, PT, DPT 02/07/20, 1:39 PM

## 2020-02-08 LAB — BASIC METABOLIC PANEL
Anion gap: 12 (ref 5–15)
BUN: 19 mg/dL (ref 8–23)
CO2: 33 mmol/L — ABNORMAL HIGH (ref 22–32)
Calcium: 8.6 mg/dL — ABNORMAL LOW (ref 8.9–10.3)
Chloride: 96 mmol/L — ABNORMAL LOW (ref 98–111)
Creatinine, Ser: 0.65 mg/dL (ref 0.61–1.24)
GFR calc Af Amer: >60 mL/min (ref >60)
GFR calc non Af Amer: >60 mL/min (ref >60)
Glucose, Bld: 102 mg/dL — ABNORMAL HIGH (ref 70–99)
Potassium: 4.4 mmol/L (ref 3.5–5.1)
Sodium: 141 mmol/L (ref 135–145)

## 2020-02-08 LAB — CBC
HCT: 43.4 % (ref 39.0–52.0)
Hemoglobin: 13.5 g/dL (ref 13.0–17.0)
MCH: 30.3 pg (ref 26.0–34.0)
MCHC: 31.1 g/dL (ref 30.0–36.0)
MCV: 97.3 fL (ref 80.0–100.0)
Platelets: 87 10*3/uL — ABNORMAL LOW (ref 150–400)
RBC: 4.46 MIL/uL (ref 4.22–5.81)
RDW: 14 % (ref 11.5–15.5)
WBC: 4.7 10*3/uL (ref 4.0–10.5)
nRBC: 0 % (ref 0.0–0.2)

## 2020-02-08 LAB — URINE CULTURE: Culture: >=100000 — AB

## 2020-02-08 MED ORDER — SODIUM CHLORIDE 0.9 % IV SOLN
INTRAVENOUS | Status: DC | PRN
  Administered 2020-02-08: 500 mL via INTRAVENOUS

## 2020-02-08 MED ORDER — PIPERACILLIN-TAZOBACTAM 3.375 G IVPB
3.3750 g | Freq: Three times a day (TID) | INTRAVENOUS | Status: DC
  Administered 2020-02-08 – 2020-02-12 (×12): 3.375 g via INTRAVENOUS
  Filled 2020-02-08 (×14): qty 50

## 2020-02-08 NOTE — Progress Notes (Signed)
PROGRESS NOTE    Curtis Alexander  OEV:[id number] DOB: 26-Mar-1946 DOA: 02/05/2020 PCP: [name]    Chief Complaint  Patient presents with  . Weakness    Brief Narrative:   Curtis Alexander is a 74 y.o. male with medical history significant of essential hypertension, diabetes without complication, GERD, essential tremor, h/o renal stones, recurrent urinary tract infections, apparently this is the fifth UTI in the last 1 year since August 2020 presents to ED for fatigue 2 to 3 days, associated with nausea, foul smelling urine and mild abdominal discomfort since 2 to 3 days. He was recently by Dr Bernardo Heater with urology for recurrent UTI'S, and was to be scheduled for CT urogram and cystoscopy. Urine analysis shows cloudy urine with moderate leukocytes with positive nitrites and many bacteria.  He was referred to [facility] for evaluation and management of recurrent urinary tract infection.  He was started on Meropenam and urine cultures show  ESBL E COLI.  IV meropenem was changed to IV Zosyn.  Assessment & Plan:   Active Problems:   UTI (urinary tract infection)    Recurrent ESBL E. coli UTI Initially started on meropenem transition to Zosyn..  Patiently currently denies any nausea vomiting abdominal pain or discomfort CT of the pelvis for evaluation of hematuria scheduled for tomorrow.   Dr Ashok Cordia aware of patient's admission.  No cystoscopy while he is having active infection.  No new complaints at this time   GERD Stable.    Essential hypertension:  Well-controlled blood pressure parameters    DM:  Not on any meds.     Chronic diastolic heart failure Patient appears to be compensated at this time.  He denies any shortness of breath or worsening pedal edema. Last echocardiogram shows left ventricular ejection fraction of 55 to 60% with no regional wall abnormalities.  Left ventricle diastolic parameters consistent with grade 1 diastolic dysfunction with impaired  relaxation.  Continue with strict intake and output and daily weights  Continue with Lasix 20 mg daily  Mild thrombocytopenia:  No bleeding seen.  Platelets dropped from 134 to 87,000. Will get immature platelet fraction level.   Hypoxia on admission:  Possibly from sleep apnea,  weaned of oxygen.  CXR wnl.  Currently on RA with good sats.    DVT prophylaxis:Lovenox.  Code Status:  Full code.  Family Communication:  none at bedside, discussed the plan with the patient.  Disposition:   Status is: Inpatient.  The patient will require care spanning > 2 midnights and should be moved to inpatient because: IV treatments appropriate due to intensity of illness or inability to take PO  Dispo: The patient is from: Home              Anticipated d/c is to: Pending              Anticipated d/c date is: 2 days              Patient currently is not medically stable to d/c.       Consultants:   None   Procedures: none.   Antimicrobials:  Anti-infectives (From admission, onward)   Start     Dose/Rate Route Frequency Ordered Stop   02/08/20 1400  piperacillin-tazobactam (ZOSYN) IVPB 3.375 g     Discontinue     3.375 g 12.5 mL/hr over 240 Minutes Intravenous Every 8 hours 02/08/20 1059     02/05/20 1900  meropenem (MERREM) 1 g in sodium chloride 0.9 % 100 mL IVPB  Status:  Discontinued        1 g 200 mL/hr over 30 Minutes Intravenous Every 8 hours 02/05/20 1831 02/08/20 1059   02/05/20 1730  meropenem (MERREM) 1 g in sodium chloride 0.9 % 100 mL IVPB  Status:  Discontinued        1 g 200 mL/hr over 30 Minutes Intravenous Every 8 hours 02/05/20 1729 02/05/20 1831         Subjective: No chest pain or sob, no nausea, vomiting or abd pain.   Objective: Vitals:   02/07/20 2035 02/08/20 0014 02/08/20 0426 02/08/20 0837  BP: 135/71 (!) 126/100 (!) 119/51 137/70  Pulse: 67 67 67 84  Resp: 16   18  Temp:  99 F (37.2 C) 98.5 F (36.9 C) 97.9 F (36.6 C)  TempSrc: Oral Oral   Oral  SpO2: 96% 97% (!) 88% 92%  Weight:      Height:        Intake/Output Summary (Last 24 hours) at 02/08/2020 1402 Last data filed at 02/08/2020 4098 Gross per 24 hour  Intake --  Output 2000 ml  Net -2000 ml   Filed Weights   02/05/20 1424  Weight: 131.5 kg    Examination:  General exam: Alert and comfortable, not in any kind of distress Respiratory system: Clear to auscultation bilaterally, no wheezing or rhonchi Cardiovascular system: S1-S2 heard, regular rate rhythm, no JVD, no pedal edema Gastrointestinal system: Abdomen is soft, nontender, nondistended, bowel sounds normal Central nervous system: Alert and oriented, grossly nonfocal Extremities: Trace edema of the lower extremities Skin: No rashes seen Psychiatry: Mood is appropriate     Data Reviewed: I have personally reviewed following labs and imaging studies  CBC: Recent Labs  Lab 02/05/20 1450 02/06/20 0430 02/08/20 0624  WBC 4.3 5.1 4.7  HGB 13.9 12.1* 13.5  HCT 46.5 40.2 43.4  MCV 99.8 99.3 97.3  PLT 134* 115* 87*    Basic Metabolic Panel: Recent Labs  Lab 02/05/20 1450 02/06/20 0430 02/08/20 0624  NA 143 143 141  K 3.9 3.8 4.4  CL 103 100 96*  CO2 33* 34* 33*  GLUCOSE 118* 97 102*  BUN 25* 21 19  CREATININE 0.83 0.66 0.65  CALCIUM 8.3* 8.2* 8.6*    GFR: Estimated Creatinine Clearance: 105.6 mL/min (by C-G formula based on SCr of 0.65 mg/dL).  Liver Function Tests: Recent Labs  Lab 02/06/20 0430  AST 17  ALT 12  ALKPHOS 69  BILITOT 0.6  PROT 5.5*  ALBUMIN 2.9*    CBG: No results for input(s): GLUCAP in the last 168 hours.   Recent Results (from the past 240 hour(s))  Urine culture     Status: Abnormal   Collection Time: 02/05/20  2:29 PM   Specimen: Urine, Random  Result Value Ref Range Status   Specimen Description   Final    URINE, RANDOM Performed at [facility] [address], [place], [place] [address]    Special Requests   Final     NONE Performed at [facility], [address] [place], [place] [address]    Culture (A)  Final    >=100,000 COLONIES/mL ESCHERICHIA COLI Confirmed Extended Spectrum Beta-Lactamase Producer (ESBL).  In bloodstream infections from ESBL organisms, carbapenems are preferred over piperacillin/tazobactam. They are shown to have a lower risk of mortality.    Report Status 02/08/2020 FINAL  Final   Organism ID, Bacteria ESCHERICHIA COLI (A)  Final      Susceptibility  Escherichia coli - MIC*    AMPICILLIN >=32 RESISTANT Resistant     CEFAZOLIN >=64 RESISTANT Resistant     CEFTRIAXONE >=64 RESISTANT Resistant     CIPROFLOXACIN >=4 RESISTANT Resistant     GENTAMICIN <=1 SENSITIVE Sensitive     IMIPENEM <=0.25 SENSITIVE Sensitive     NITROFURANTOIN 128 RESISTANT Resistant     TRIMETH/SULFA >=320 RESISTANT Resistant     AMPICILLIN/SULBACTAM >=32 RESISTANT Resistant     PIP/TAZO <=4 SENSITIVE Sensitive     * >=100,000 COLONIES/mL ESCHERICHIA COLI  SARS Coronavirus 2 by RT PCR (hospital order, performed in [place] hospital lab) Nasopharyngeal Nasopharyngeal Swab     Status: None   Collection Time: 02/05/20  5:48 PM   Specimen: Nasopharyngeal Swab  Result Value Ref Range Status   SARS Coronavirus 2 NEGATIVE NEGATIVE Final    Comment: (NOTE) SARS-CoV-2 target nucleic acids are NOT DETECTED.  The SARS-CoV-2 RNA is generally detectable in upper and lower respiratory specimens during the acute phase of infection. The lowest concentration of SARS-CoV-2 viral copies this assay can detect is 250 copies / mL. A negative result does not preclude SARS-CoV-2 infection and should not be used as the sole basis for treatment or other patient management decisions.  A negative result may occur with improper specimen collection / handling, submission of specimen other than nasopharyngeal swab, presence of viral mutation(s) within the areas targeted by this assay, and inadequate number of viral  copies (<250 copies / mL). A negative result must be combined with clinical observations, patient history, and epidemiological information.  Fact Sheet for Patients:   StrictlyIdeas.no  Fact Sheet for Healthcare Providers: BankingDealers.co.za  This test is not yet approved or  cleared by the [place] FDA and has been authorized for detection and/or diagnosis of SARS-CoV-2 by FDA under an Emergency Use Authorization (EUA).  This EUA will remain in effect (meaning this test can be used) for the duration of the COVID-19 declaration under Section 564(b)(1) of the Act, 21 U.S.C. section 360bbb-3(b)(1), unless the authorization is terminated or revoked sooner.  Performed at [facility], [address] [employer] [employer] [facility], [place] [address]          Radiology Studies: No results found.      Scheduled Meds: . allopurinol  300 mg Oral Q lunch  . brimonidine  1 drop Right Eye BID  . clonazePAM  0.25 mg Oral BID WC  . clonazePAM  0.5 mg Oral QHS  . enoxaparin (LOVENOX) injection  40 mg Subcutaneous Q12H  . ferrous sulfate  325 mg Oral QODAY  . finasteride  5 mg Oral Q lunch  . furosemide  20 mg Oral Daily  . gabapentin  300 mg Oral BID  . latanoprost  1 drop Right Eye QHS  . mirtazapine  45 mg Oral QHS  . pantoprazole  40 mg Oral Daily  . primidone  50 mg Oral q1600  . primidone  75 mg Oral q AM  . propranolol  40 mg Oral BID  . simvastatin  20 mg Oral QPM  . tamsulosin  0.4 mg Oral QHS  . vitamin B-12  2,500 mcg Oral Daily  . zolpidem  5 mg Oral QHS   Continuous Infusions: . piperacillin-tazobactam (ZOSYN)  IV       LOS: 2 days        Hosie Poisson, MD [facility]   To contact the attending provider between 7A-7P or the covering provider during after hours 7P-7A, please log into  the web site [email or web address] and access using universal [place] password for that web site. If you do not have the  password, please call the hospital operator.  02/08/2020, 2:02 PM

## 2020-02-09 ENCOUNTER — Encounter: Payer: Self-pay | Admitting: Internal Medicine

## 2020-02-09 ENCOUNTER — Inpatient Hospital Stay: Payer: Medicare Other

## 2020-02-09 LAB — CBC WITH DIFFERENTIAL/PLATELET
Abs Immature Granulocytes: 0.01 10*3/uL (ref 0.00–0.07)
Basophils Absolute: 0 10*3/uL (ref 0.0–0.1)
Basophils Relative: 0 %
Eosinophils Absolute: 0.1 10*3/uL (ref 0.0–0.5)
Eosinophils Relative: 2 %
HCT: 41 % (ref 39.0–52.0)
Hemoglobin: 13 g/dL (ref 13.0–17.0)
Immature Granulocytes: 0 %
Lymphocytes Relative: 36 %
Lymphs Abs: 1.8 10*3/uL (ref 0.7–4.0)
MCH: 30.7 pg (ref 26.0–34.0)
MCHC: 31.7 g/dL (ref 30.0–36.0)
MCV: 96.7 fL (ref 80.0–100.0)
Monocytes Absolute: 0.7 10*3/uL (ref 0.1–1.0)
Monocytes Relative: 13 %
Neutro Abs: 2.5 10*3/uL (ref 1.7–7.7)
Neutrophils Relative %: 49 %
Platelets: 121 10*3/uL — ABNORMAL LOW (ref 150–400)
RBC: 4.24 MIL/uL (ref 4.22–5.81)
RDW: 14.1 % (ref 11.5–15.5)
WBC: 5 10*3/uL (ref 4.0–10.5)
nRBC: 0 % (ref 0.0–0.2)

## 2020-02-09 LAB — IMMATURE PLATELET FRACTION: Immature Platelet Fraction: 3.1 % (ref 1.2–8.6)

## 2020-02-09 MED ORDER — POLYVINYL ALCOHOL 1.4 % OP SOLN
1.0000 [drp] | OPHTHALMIC | Status: DC | PRN
  Filled 2020-02-09: qty 15

## 2020-02-09 MED ORDER — IOHEXOL 300 MG/ML  SOLN
125.0000 mL | Freq: Once | INTRAMUSCULAR | Status: AC | PRN
  Administered 2020-02-09: 125 mL via INTRAVENOUS

## 2020-02-09 NOTE — Care Management Important Message (Signed)
Important Message  Patient Details  Name: Curtis Alexander MRN: [id number] Date of Birth: [birth year]   Medicare Important Message Given:  Yes     Shelbie Hutching, RN 02/09/2020, 11:17 AM

## 2020-02-09 NOTE — NC FL2 (Signed)
[place] LEVEL OF CARE SCREENING TOOL     IDENTIFICATION  Patient Name: Curtis Alexander Birthdate: [birth year] Sex: male Admission Date (Current Location): 02/05/2020  [facility] and [place] Number:  Engineering geologist and Address:  [facility], [address] [place], [place] [address]      Provider Number: [id number]  Attending Physician Name and Address:  Hosie Poisson, MD  Relative Name and Phone Number:  Isabel Freese daughter [phone]    Current Level of Care: Hospital Recommended Level of Care: [place] Prior Approval Number:    Date Approved/Denied:   PASRR Number: pending  Discharge Plan: SNF    Current Diagnoses: Patient Active Problem List   Diagnosis Date Noted  . UTI (urinary tract infection) 02/05/2020  . Urinary tract infection 01/09/2020  . Non-traumatic rupture of right patellar tendon 03/25/2019  . 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 Essential tremor 03/04/2019  . Gout 03/04/2019  . Intestinal malabsorption 09/29/2018  . 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 Lumbar herniated disc 12/17/2012  . Nephrolithiasis 06/11/2012  . Anxiety disorder 03/12/2012  . Status post abdominoplasty 04/30/2007  . Absence of bladder continence 11/04/2003  . Back pain 11/04/2003  . Depression 11/04/2003  . Diabetes mellitus, type II ([place]) 11/04/2003  . Hypertension 11/04/2003  . Insomnia 11/04/2003  . Morbid obesity ([place]) 11/04/2003  . Orthopnea 11/04/2003  . Peptic ulcer disease 11/04/2003  . Sleep apnea 11/04/2003  . Venous insufficiency 11/04/2003    Orientation RESPIRATION BLADDER Height & Weight     Self, Time, Situation, Place  Normal Continent Weight: 131.5 kg Height:  5\' 8"  (172.7 cm)  BEHAVIORAL SYMPTOMS/MOOD NEUROLOGICAL BOWEL NUTRITION STATUS      Continent Diet (see  discharge summary)  AMBULATORY STATUS COMMUNICATION OF NEEDS Skin   Limited Assist Verbally Normal                       Personal Care Assistance Level of Assistance  Bathing, Feeding, Dressing Bathing Assistance: Limited assistance Feeding assistance: Limited assistance Dressing Assistance: Limited assistance     Functional Limitations Info             SPECIAL CARE FACTORS FREQUENCY  PT (By licensed PT), OT (By licensed OT)     PT Frequency: 5 times per week OT Frequency: 5 times per week            Contractures Contractures Info: Not present    Additional Factors Info  Code Status, Allergies, Isolation Precautions Code Status Info: Full Allergies Info: NKA     Isolation Precautions Info: Contact- ESBL     Current Medications (02/09/2020):  This is the current hospital active medication list Current Facility-Administered Medications  Medication Dose Route Frequency Provider Last Rate Last Admin  . 0.9 %  sodium chloride infusion   Intravenous PRN Hosie Poisson, MD   Stopped at 02/08/20 1531  . acetaminophen (TYLENOL) tablet 650 mg  650 mg Oral Q6H PRN Hosie Poisson, MD       Or  . acetaminophen (TYLENOL) suppository 650 mg  650 mg Rectal Q6H PRN Hosie Poisson, MD      . allopurinol (ZYLOPRIM) tablet 300 mg  300 mg Oral Q lunch Hosie Poisson, MD   300 mg at 02/08/20 1316  . brimonidine (ALPHAGAN) 0.2 % ophthalmic solution 1 drop  1 drop Right Eye BID Hosie Poisson,  MD   1 drop at 02/09/20 0810  . clonazePAM (KLONOPIN) tablet 0.25 mg  0.25 mg Oral BID WC Hosie Poisson, MD   0.25 mg at 02/09/20 0803  . clonazePAM (KLONOPIN) tablet 0.5 mg  0.5 mg Oral QHS Hosie Poisson, MD   0.5 mg at 02/08/20 2300  . enoxaparin (LOVENOX) injection 40 mg  40 mg Subcutaneous Q12H Hosie Poisson, MD   40 mg at 02/09/20 0807  . ferrous sulfate tablet 325 mg  325 mg Oral Susie Cassette, MD   325 mg at 02/09/20 0806  . finasteride (PROSCAR) tablet 5 mg  5 mg Oral Q lunch Hosie Poisson, MD   5 mg at 02/08/20 1316  . furosemide (LASIX) tablet 20 mg  20 mg Oral Daily Hosie Poisson, MD      . gabapentin (NEURONTIN) capsule 300 mg  300 mg Oral BID Hosie Poisson, MD   300 mg at 02/09/20 0806  . latanoprost (XALATAN) 0.005 % ophthalmic solution 1 drop  1 drop Right Eye Delsa Sale, MD   1 drop at 02/08/20 2046  . loratadine (CLARITIN) tablet 10 mg  10 mg Oral Daily PRN Hosie Poisson, MD      . mirtazapine (REMERON) tablet 45 mg  45 mg Oral QHS Hosie Poisson, MD   45 mg at 02/08/20 2046  . ondansetron (ZOFRAN) tablet 4 mg  4 mg Oral Q6H PRN Hosie Poisson, MD       Or  . ondansetron (ZOFRAN) injection 4 mg  4 mg Intravenous Q6H PRN Hosie Poisson, MD      . pantoprazole (PROTONIX) EC tablet 40 mg  40 mg Oral Daily Hosie Poisson, MD   40 mg at 02/09/20 0806  . piperacillin-tazobactam (ZOSYN) IVPB 3.375 g  3.375 g Intravenous Q8H Hosie Poisson, MD 12.5 mL/hr at 02/09/20 0500 3.375 g at 02/09/20 0500  . primidone (MYSOLINE) tablet 50 mg  50 mg Oral q1600 Hosie Poisson, MD   50 mg at 02/08/20 1523  . primidone (MYSOLINE) tablet 75 mg  75 mg Oral q AM Hosie Poisson, MD   75 mg at 02/09/20 0806  . propranolol (INDERAL) tablet 40 mg  40 mg Oral BID Hosie Poisson, MD   40 mg at 02/09/20 0803  . senna-docusate (Senokot-S) tablet 1 tablet  1 tablet Oral QHS PRN Hosie Poisson, MD      . simvastatin (ZOCOR) tablet 20 mg  20 mg Oral QPM Hosie Poisson, MD   20 mg at 02/08/20 1747  . tamsulosin (FLOMAX) capsule 0.4 mg  0.4 mg Oral QHS Hosie Poisson, MD   0.4 mg at 02/08/20 2045  . vitamin B-12 (CYANOCOBALAMIN) tablet 2,500 mcg  2,500 mcg Oral Daily Hosie Poisson, MD   2,500 mcg at 02/09/20 0802  . zolpidem (AMBIEN) tablet 5 mg  5 mg Oral QHS Hosie Poisson, MD   5 mg at 02/08/20 2249     Discharge Medications: Please see discharge summary for a list of discharge medications.  Relevant Imaging Results:  Relevant Lab Results:   Additional Information ss- [id number]  Shelbie Hutching, RN

## 2020-02-09 NOTE — Progress Notes (Signed)
PROGRESS NOTE    Curtis Alexander  RWE:[id number] DOB: [birth year] DOA: 02/05/2020 PCP: [name]    Chief Complaint  Patient presents with  . Weakness    Brief Narrative:   Curtis Alexander is a 74 y.o. male with medical history significant of essential hypertension, diabetes without complication, GERD, essential tremor, h/o renal stones, recurrent urinary tract infections, apparently this is the fifth UTI in the last 1 year since August 2020 presents to ED for fatigue 2 to 3 days, associated with nausea, foul smelling urine and mild abdominal discomfort since 2 to 3 days. He was recently by Dr Bernardo Heater with urology for recurrent UTI'S, and was to be scheduled for CT urogram and cystoscopy. Urine analysis shows cloudy urine with moderate leukocytes with positive nitrites and many bacteria.  He was referred to [facility] for evaluation and management of recurrent urinary tract infection.  He was started on Meropenam and urine cultures show  ESBL E COLI.  IV meropenem was changed to IV Zosyn.  Assessment & Plan:   Active Problems:   UTI (urinary tract infection)    Recurrent ESBL E. coli UTI Initially started on meropenem transitioned to Zosyn..  Patiently currently denies any nausea, vomiting, abdominal pain or discomfort. Recommend at least 7 days of IV antibiotics.  CT of the pelvis for evaluation of hematuria  Shows Right renal stones. Distal ureters are poorly opacified, limiting evaluation. No additional findings to explain the patient's symptoms.. Right adrenal adenoma Dr Ashok Cordia aware of patient's admission, will follow up in the office for cystoscopy.  No cystoscopy while he is having active infection.  No new complaints at this time   GERD Stable.    Essential hypertension:  Well controlled.     DM:  Not on any meds.     Chronic diastolic heart failure Patient appears to be compensated at this time.  He denies any shortness of breath or worsening pedal  edema. Last echocardiogram shows left ventricular ejection fraction of 55 to 60% with no regional wall abnormalities.  Left ventricle diastolic parameters consistent with grade 1 diastolic dysfunction with impaired relaxation.  Continue with strict intake and output and daily weights  Continue with Lasix 20 mg daily  Mild thrombocytopenia:  No bleeding seen.  Platelets dropped from 134 to 87,000. Will get immature platelet fraction level. Cbc pending today.   Hypoxia on admission:  Possibly from sleep apnea, resolved.  weaned of oxygen.  CXR wnl.  Currently on RA with good sats.    DVT prophylaxis:Lovenox.  Code Status:  Full code.  Family Communication:  none at bedside, discussed the plan with the patient.  Disposition:   Status is: Inpatient.  The patient will require care spanning > 2 midnights and should be moved to inpatient because: IV treatments appropriate due to intensity of illness or inability to take PO  Dispo: The patient is from: Home              Anticipated d/c is to: SNF              Anticipated d/c date is: 1 day              Patient currently is not medically stable to d/c.       Consultants:   None   Procedures: none.   Antimicrobials:  Anti-infectives (From admission, onward)   Start     Dose/Rate Route Frequency Ordered Stop   02/08/20 1400  piperacillin-tazobactam (ZOSYN) IVPB 3.375 g  Discontinue     3.375 g 12.5 mL/hr over 240 Minutes Intravenous Every 8 hours 02/08/20 1059     02/05/20 1900  meropenem (MERREM) 1 g in sodium chloride 0.9 % 100 mL IVPB  Status:  Discontinued        1 g 200 mL/hr over 30 Minutes Intravenous Every 8 hours 02/05/20 1831 02/08/20 1059   02/05/20 1730  meropenem (MERREM) 1 g in sodium chloride 0.9 % 100 mL IVPB  Status:  Discontinued        1 g 200 mL/hr over 30 Minutes Intravenous Every 8 hours 02/05/20 1729 02/05/20 1831         Subjective: No new complaints this am.    Objective: Vitals:    02/08/20 2021 02/09/20 0246 02/09/20 0806 02/09/20 1138  BP: (!) 128/49 115/67 122/63 (!) 104/57  Pulse: 66 85 80 61  Resp: 15 14  16   Temp: 98.4 F (36.9 C) 97.7 F (36.5 C) 97.7 F (36.5 C) 98.5 F (36.9 C)  TempSrc: Oral Oral Oral Oral  SpO2: 93% (!) 85% 94% 96%  Weight:      Height:        Intake/Output Summary (Last 24 hours) at 02/09/2020 1521 Last data filed at 02/09/2020 1500 Gross per 24 hour  Intake 262.87 ml  Output 3400 ml  Net -3137.13 ml   Filed Weights   02/05/20 1424  Weight: 131.5 kg    Examination:  General exam: Alert, comfortable not in any kind of distress Respiratory system: Diminished air entry at bases no wheezing or rhonchi Cardiovascular system: S1-S2 heard, regular rate rhythm, no JVD Gastrointestinal system: Abdomen is soft, nontender, nondistended, bowel sounds normal Central nervous system: Alert and oriented, grossly nonfocal Extremities: No cyanosis or clubbing Skin: No rashes or ulcers.  Psychiatry: Mood is appropriate     Data Reviewed: I have personally reviewed following labs and imaging studies  CBC: Recent Labs  Lab 02/05/20 1450 02/06/20 0430 02/08/20 0624  WBC 4.3 5.1 4.7  HGB 13.9 12.1* 13.5  HCT 46.5 40.2 43.4  MCV 99.8 99.3 97.3  PLT 134* 115* 87*    Basic Metabolic Panel: Recent Labs  Lab 02/05/20 1450 02/06/20 0430 02/08/20 0624  NA 143 143 141  K 3.9 3.8 4.4  CL 103 100 96*  CO2 33* 34* 33*  GLUCOSE 118* 97 102*  BUN 25* 21 19  CREATININE 0.83 0.66 0.65  CALCIUM 8.3* 8.2* 8.6*    GFR: Estimated Creatinine Clearance: 105.6 mL/min (by C-G formula based on SCr of 0.65 mg/dL).  Liver Function Tests: Recent Labs  Lab 02/06/20 0430  AST 17  ALT 12  ALKPHOS 69  BILITOT 0.6  PROT 5.5*  ALBUMIN 2.9*    CBG: No results for input(s): GLUCAP in the last 168 hours.   Recent Results (from the past 240 hour(s))  Urine culture     Status: Abnormal   Collection Time: 02/05/20  2:29 PM   Specimen:  Urine, Random  Result Value Ref Range Status   Specimen Description   Final    URINE, RANDOM Performed at [facility], [address] [place], [place] [address]    Special Requests   Final    NONE Performed at [facility], [address] [place], [place] [address]    Culture (A)  Final    >=100,000 COLONIES/mL ESCHERICHIA COLI Confirmed Extended Spectrum Beta-Lactamase Producer (ESBL).  In bloodstream infections from ESBL organisms, carbapenems are preferred over piperacillin/tazobactam. They are  shown to have a lower risk of mortality.    Report Status 02/08/2020 FINAL  Final   Organism ID, Bacteria ESCHERICHIA COLI (A)  Final      Susceptibility   Escherichia coli - MIC*    AMPICILLIN >=32 RESISTANT Resistant     CEFAZOLIN >=64 RESISTANT Resistant     CEFTRIAXONE >=64 RESISTANT Resistant     CIPROFLOXACIN >=4 RESISTANT Resistant     GENTAMICIN <=1 SENSITIVE Sensitive     IMIPENEM <=0.25 SENSITIVE Sensitive     NITROFURANTOIN 128 RESISTANT Resistant     TRIMETH/SULFA >=320 RESISTANT Resistant     AMPICILLIN/SULBACTAM >=32 RESISTANT Resistant     PIP/TAZO <=4 SENSITIVE Sensitive     * >=100,000 COLONIES/mL ESCHERICHIA COLI  SARS Coronavirus 2 by RT PCR (hospital order, performed in [place] hospital lab) Nasopharyngeal Nasopharyngeal Swab     Status: None   Collection Time: 02/05/20  5:48 PM   Specimen: Nasopharyngeal Swab  Result Value Ref Range Status   SARS Coronavirus 2 NEGATIVE NEGATIVE Final    Comment: (NOTE) SARS-CoV-2 target nucleic acids are NOT DETECTED.  The SARS-CoV-2 RNA is generally detectable in upper and lower respiratory specimens during the acute phase of infection. The lowest concentration of SARS-CoV-2 viral copies this assay can detect is 250 copies / mL. A negative result does not preclude SARS-CoV-2 infection and should not be used as the sole basis for treatment or other patient management decisions.  A negative result may  occur with improper specimen collection / handling, submission of specimen other than nasopharyngeal swab, presence of viral mutation(s) within the areas targeted by this assay, and inadequate number of viral copies (<250 copies / mL). A negative result must be combined with clinical observations, patient history, and epidemiological information.  Fact Sheet for Patients:   StrictlyIdeas.no  Fact Sheet for Healthcare Providers: BankingDealers.co.za  This test is not yet approved or  cleared by the [place] FDA and has been authorized for detection and/or diagnosis of SARS-CoV-2 by FDA under an Emergency Use Authorization (EUA).  This EUA will remain in effect (meaning this test can be used) for the duration of the COVID-19 declaration under Section 564(b)(1) of the Act, 21 U.S.C. section 360bbb-3(b)(1), unless the authorization is terminated or revoked sooner.  Performed at [facility], [address], [place], [place] [address]          Radiology Studies: CT HEMATURIA WORKUP  Result Date: 02/09/2020 CLINICAL DATA:  Recurrent urinary tract infections. EXAM: CT ABDOMEN AND PELVIS WITHOUT AND WITH CONTRAST TECHNIQUE: Multidetector CT imaging of the abdomen and pelvis was performed following the standard protocol before and following the bolus administration of intravenous contrast. CONTRAST:  178mL OMNIPAQUE IOHEXOL 300 MG/ML  SOLN COMPARISON:  None. FINDINGS: Lower chest: Minimal dependent volume loss bilaterally. Coronary artery calcification. Heart is at the upper limits of normal in size to mildly enlarged. No pericardial or pleural effusion. Distal esophagus is unremarkable. Hepatobiliary: 2.6 cm well-circumscribed low-attenuation lesion in segment 4 of the liver is likely a cyst. Liver and gallbladder are otherwise unremarkable. No biliary ductal dilatation. Pancreas: Negative. Spleen: Negative. Adrenals/Urinary Tract:  Fluid density nodule in the right adrenal gland measures 2.2 cm. Left adrenal gland is unremarkable. Right renal stones measure up to approximately 2.4 x 3.4 cm. Nonenhancing cysts in the right kidney measure up to 3.4 cm. Kidneys are otherwise unremarkable. Distal ureters are poorly opacified, limiting evaluation. Otherwise, no filling defects in the opacified portions of the kidneys, ureters  and bladder. Small right posterolateral bladder diverticulum. Stomach/Bowel: Gastric bypass and hiatal hernia repair. Stomach, small bowel and appendix are otherwise unremarkable. Fair amount of stool is seen in the colon, indicative of constipation. Vascular/Lymphatic: IVC filter is in place. Atherosclerotic calcification of the aorta without aneurysm. No pathologically enlarged lymph nodes. Reproductive: Prostate is visualized. Other: No free fluid. Elevated right hemidiaphragm. Locules of air along the ventral abdominal wall indicate subcutaneous injections. Mesenteries and peritoneum are unremarkable. Musculoskeletal: Degenerative changes in the spine and hips. Flowing anterior osteophytosis in the thoracic spine. IMPRESSION: 1. Right renal stones. Distal ureters are poorly opacified, limiting evaluation. No additional findings to explain the patient's symptoms. 2. Right adrenal adenoma. 3. Aortic atherosclerosis (ICD10-I70.0). Coronary artery calcification. Electronically Signed   By: Lorin Picket M.D.   On: 02/09/2020 10:14        Scheduled Meds: . allopurinol  300 mg Oral Q lunch  . brimonidine  1 drop Right Eye BID  . clonazePAM  0.25 mg Oral BID WC  . clonazePAM  0.5 mg Oral QHS  . enoxaparin (LOVENOX) injection  40 mg Subcutaneous Q12H  . ferrous sulfate  325 mg Oral QODAY  . finasteride  5 mg Oral Q lunch  . furosemide  20 mg Oral Daily  . gabapentin  300 mg Oral BID  . latanoprost  1 drop Right Eye QHS  . mirtazapine  45 mg Oral QHS  . pantoprazole  40 mg Oral Daily  . primidone  50 mg Oral  q1600  . primidone  75 mg Oral q AM  . propranolol  40 mg Oral BID  . simvastatin  20 mg Oral QPM  . tamsulosin  0.4 mg Oral QHS  . vitamin B-12  2,500 mcg Oral Daily  . zolpidem  5 mg Oral QHS   Continuous Infusions: . sodium chloride Stopped (02/09/20 1423)  . piperacillin-tazobactam (ZOSYN)  IV 12.5 mL/hr at 02/09/20 1500     LOS: 3 days        Hosie Poisson, MD [facility]   To contact the attending provider between 7A-7P or the covering provider during after hours 7P-7A, please log into the web site [email or web address] and access using universal [place] password for that web site. If you do not have the password, please call the hospital operator.  02/09/2020, 3:21 PM

## 2020-02-09 NOTE — TOC Initial Note (Signed)
Transition of Care [facility]) - Initial/Assessment Note    Patient Details  Name: Curtis Alexander MRN: [id number] Date of Birth: 07/19/1946  Transition [facility]) CM/SW Contact:    Shelbie Hutching, RN Phone Number: 02/09/2020, 10:49 AM  Clinical Narrative:                 Patient admitted to the hospital with urinary tract infection.  Patient reports he has frequent urinary tract infections.   Patient lives with his daughter and granddaughter in [place].  Patient reports that he has not been very independent at home recently, he has a walker and a wheelchair. PT is recommending skilled nursing for rehab and patient agrees with this plan.  Patient given a list of SNF's in this area to review, he has been to [facility] in [place] before and may want to go back there.   RNCM will follow up with patient later today after he has a chance to review the facilities.   Expected Discharge Plan: Skilled Nursing Facility Barriers to Discharge: Continued Medical Work up   Patient Goals and CMS Choice Patient states their goals for this hospitalization and ongoing recovery are:: Patient is in agreement with skilled nursing for rehab CMS Medicare.gov Compare Post Acute Care list provided to:: Patient Choice offered to / list presented to : Patient  Expected Discharge Plan and Services Expected Discharge Plan: [place]   Discharge Planning Services: CM Consult Post Acute Care Choice: [place] Living arrangements for the past 2 months: Single Family Home                                      Prior Living Arrangements/Services Living arrangements for the past 2 months: Single Family Home Lives with:: Adult Children Patient language and need for interpreter reviewed:: Yes Do you feel safe going back to the place where you live?: Yes      Need for Family Participation in Patient Care: Yes (Comment) (recurrent UTI's) Care giver support system in place?: Yes  (comment) (daughter) Current home services: DME (walker and wheelchair) Criminal Activity/Legal Involvement Pertinent to Current Situation/Hospitalization: No - Comment as needed  Activities of Daily Living Home Assistive Devices/Equipment: Environmental consultant (specify type) ADL Screening (condition at time of admission) Patient's cognitive ability adequate to safely complete daily activities?: Yes Is the patient deaf or have difficulty hearing?: No Does the patient have difficulty seeing, even when wearing glasses/contacts?: Yes (R eye) Does the patient have difficulty concentrating, remembering, or making decisions?: No Patient able to express need for assistance with ADLs?: Yes Does the patient have difficulty dressing or bathing?: No Independently performs ADLs?: Yes (appropriate for developmental age) Does the patient have difficulty walking or climbing stairs?: Yes Weakness of Legs: Both Weakness of Arms/Hands: None  Permission Sought/Granted Permission sought to share information with : Case Manager, Customer service manager, Family Supports Permission granted to share information with : Yes, Verbal Permission Granted  Share Information with NAME: Keyion Knack  Permission granted to share info w AGENCY: SNF's  Permission granted to share info w Relationship: daughter     Emotional Assessment Appearance:: Appears stated age Attitude/Demeanor/Rapport: Engaged Affect (typically observed): Accepting Orientation: : Oriented to Self, Oriented to Place, Oriented to  Time, Oriented to Situation Alcohol / Substance Use: Not Applicable Psych Involvement: No (comment)  Admission diagnosis:  UTI (urinary tract infection) [N39.0] Hypoxia [R09.02] UTI due to  extended-spectrum beta lactamase (ESBL) producing Escherichia coli [N39.0, B96.29, Z16.12] Patient Active Problem List   Diagnosis Date Noted  . UTI (urinary tract infection) 02/05/2020  . Urinary tract infection 01/09/2020  .  Non-traumatic rupture of right patellar tendon 03/25/2019  . 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 Essential tremor 03/04/2019  . Gout 03/04/2019  . Intestinal malabsorption 09/29/2018  . 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 Lumbar herniated disc 12/17/2012  . Nephrolithiasis 06/11/2012  . Anxiety disorder 03/12/2012  . Status post abdominoplasty 04/30/2007  . Absence of bladder continence 11/04/2003  . Back pain 11/04/2003  . Depression 11/04/2003  . Diabetes mellitus, type II ([place]) 11/04/2003  . Hypertension 11/04/2003  . Insomnia 11/04/2003  . Morbid obesity ([place]) 11/04/2003  . Orthopnea 11/04/2003  . Peptic ulcer disease 11/04/2003  . Sleep apnea 11/04/2003  . Venous insufficiency 11/04/2003   PCP:  [name] Pharmacy:   [place], [place] [address] Phone: [phone] Fax: [phone]     Social Determinants of Health (SDOH) Interventions    Readmission Risk Interventions Readmission Risk Prevention Plan 03/30/2019  Transportation Screening Complete  PCP or Specialist Appt within 5-7 Days Complete  Home Care Screening Complete  Medication Review (RN CM) Complete

## 2020-02-10 NOTE — Progress Notes (Signed)
PROGRESS NOTE    Curtis Alexander  IOM:[id number] DOB: 02-27-1946 DOA: 02/05/2020 PCP: [name]    Chief Complaint  Patient presents with  . Weakness    Brief Narrative:   Curtis Alexander is a 74 y.o. male with medical history significant of essential hypertension, diabetes without complication, GERD, essential tremor, h/o renal stones, recurrent urinary tract infections, apparently this is the fifth UTI in the last 1 year since August 2020 presents to ED for fatigue 2 to 3 days, associated with nausea, foul smelling urine and mild abdominal discomfort since 2 to 3 days. He was recently by Dr Bernardo Heater with urology for recurrent UTI'S, and was to be scheduled for CT urogram and cystoscopy. Urine analysis shows cloudy urine with moderate leukocytes with positive nitrites and many bacteria.  He was referred to [facility] for evaluation and management of recurrent urinary tract infection.  He was started on Meropenam, later on transitioned to zosyn as  urine cultures show  ESBL E COLI.  Complete 7 day course. PT eval recommending SNF. Currently waiting for placement.   Assessment & Plan:   Active Problems:   UTI (urinary tract infection)    Recurrent ESBL E. coli UTI Initially started on meropenem transitioned to Zosyn.. Patient currently denies any nausea vomiting abdominal discomfort or suprapubic discomfort.  Plan to complete the course of 7 days of IV antibiotics prior to discharge. CT of the pelvis for evaluation of hematuria  Shows Right renal stones. Distal ureters are poorly opacified, limiting evaluation. No additional findings to explain the patient's symptoms.. Right adrenal adenoma.  Commend outpatient follow-up of the right adrenal adenoma. Dr Ashok Cordia with urology aware of patient's admission, will follow up in the office for cystoscopy.  No cystoscopy while he is having active infection.  No new complaints at this time   GERD Stable   Essential hypertension:    Well-controlled    DM:  Not on any meds.     Chronic diastolic heart failure Patient appears to be compensated at this time.  He denies any shortness of breath or worsening pedal edema. Last echocardiogram shows left ventricular ejection fraction of 55 to 60% with no regional wall abnormalities.  Left ventricle diastolic parameters consistent with grade 1 diastolic dysfunction with impaired relaxation.  Continue with strict intake and output and daily weights  Continue with Lasix 20 mg daily  Mild thrombocytopenia:  No bleeding seen.  Platelets dropped from 134 to 115000 to 87000 to  121000. Will get immature platelet fraction level, pending. .    Hypoxia on admission:  Possibly from sleep apnea, resolved.  weaned of oxygen.  CXR wnl.  Currently on RA with good sats.   Body mass index is 44.09 kg/m. Morbid obesity.  Poor prognostic factor.   DVT prophylaxis:Lovenox.  Code Status:  Full code.  Family Communication:  none at bedside, discussed the plan with the patient.  Disposition:   Status is: Inpatient.  The patient will require care spanning > 2 midnights and should be moved to inpatient because: IV treatments appropriate due to intensity of illness or inability to take PO  Dispo: The patient is from: Home              Anticipated d/c is to: SNF              Anticipated d/c date is: 1 day              Patient currently is not medically stable to d/c.  Consultants:   None   Procedures: none.   Antimicrobials:  Anti-infectives (From admission, onward)   Start     Dose/Rate Route Frequency Ordered Stop   02/08/20 1400  piperacillin-tazobactam (ZOSYN) IVPB 3.375 g     Discontinue     3.375 g 12.5 mL/hr over 240 Minutes Intravenous Every 8 hours 02/08/20 1059     02/05/20 1900  meropenem (MERREM) 1 g in sodium chloride 0.9 % 100 mL IVPB  Status:  Discontinued        1 g 200 mL/hr over 30 Minutes Intravenous Every 8 hours 02/05/20 1831 02/08/20 1059    02/05/20 1730  meropenem (MERREM) 1 g in sodium chloride 0.9 % 100 mL IVPB  Status:  Discontinued        1 g 200 mL/hr over 30 Minutes Intravenous Every 8 hours 02/05/20 1729 02/05/20 1831         Subjective: No new complaints today no chest pain, shortness of breath or abdominal pain  Objective: Vitals:   02/10/20 0414 02/10/20 0755 02/10/20 1146 02/10/20 1709  BP: 111/67 121/74 98/60 111/61  Pulse: 64 70 (!) 57 61  Resp: 20 17 18 17   Temp: 98.1 F (36.7 C) 97.9 F (36.6 C) 98.2 F (36.8 C) 98.2 F (36.8 C)  TempSrc: Oral Oral    SpO2: 93% 90% 93% 93%  Weight:      Height:        Intake/Output Summary (Last 24 hours) at 02/10/2020 1824 Last data filed at 02/10/2020 0751 Gross per 24 hour  Intake --  Output 1475 ml  Net -1475 ml   Filed Weights   02/05/20 1424  Weight: 131.5 kg    Examination:  General exam: Alert, comfortable not in any kind of distress. Respiratory system: Clear to auscultation bilaterally, no wheezing or rhonchi Cardiovascular system: S1-S2 heard, regular rate rhythm, no JVD Gastrointestinal system: Abdomen is soft, nontender, nondistended, bowel sounds normal Central nervous system: Alert and oriented to place person and time, grossly nonfocal Extremities: N no cyanosis or clubbing Skin: No rashes seen Psychiatry: Mood is appropriate     Data Reviewed: I have personally reviewed following labs and imaging studies  CBC: Recent Labs  Lab 02/05/20 1450 02/06/20 0430 02/08/20 0624 02/09/20 1542  WBC 4.3 5.1 4.7 5.0  NEUTROABS  --   --   --  2.5  HGB 13.9 12.1* 13.5 13.0  HCT 46.5 40.2 43.4 41.0  MCV 99.8 99.3 97.3 96.7  PLT 134* 115* 87* 121*    Basic Metabolic Panel: Recent Labs  Lab 02/05/20 1450 02/06/20 0430 02/08/20 0624  NA 143 143 141  K 3.9 3.8 4.4  CL 103 100 96*  CO2 33* 34* 33*  GLUCOSE 118* 97 102*  BUN 25* 21 19  CREATININE 0.83 0.66 0.65  CALCIUM 8.3* 8.2* 8.6*    GFR: Estimated Creatinine  Clearance: 105.6 mL/min (by C-G formula based on SCr of 0.65 mg/dL).  Liver Function Tests: Recent Labs  Lab 02/06/20 0430  AST 17  ALT 12  ALKPHOS 69  BILITOT 0.6  PROT 5.5*  ALBUMIN 2.9*    CBG: No results for input(s): GLUCAP in the last 168 hours.   Recent Results (from the past 240 hour(s))  Urine culture     Status: Abnormal   Collection Time: 02/05/20  2:29 PM   Specimen: Urine, Random  Result Value Ref Range Status   Specimen Description   Final    URINE, RANDOM Performed at [facility]  [facility], [address] [place], [place] [address]    Special Requests   Final    NONE Performed at [facility], [address] [place], [place] [address]    Culture (A)  Final    >=100,000 COLONIES/mL ESCHERICHIA COLI Confirmed Extended Spectrum Beta-Lactamase Producer (ESBL).  In bloodstream infections from ESBL organisms, carbapenems are preferred over piperacillin/tazobactam. They are shown to have a lower risk of mortality.    Report Status 02/08/2020 FINAL  Final   Organism ID, Bacteria ESCHERICHIA COLI (A)  Final      Susceptibility   Escherichia coli - MIC*    AMPICILLIN >=32 RESISTANT Resistant     CEFAZOLIN >=64 RESISTANT Resistant     CEFTRIAXONE >=64 RESISTANT Resistant     CIPROFLOXACIN >=4 RESISTANT Resistant     GENTAMICIN <=1 SENSITIVE Sensitive     IMIPENEM <=0.25 SENSITIVE Sensitive     NITROFURANTOIN 128 RESISTANT Resistant     TRIMETH/SULFA >=320 RESISTANT Resistant     AMPICILLIN/SULBACTAM >=32 RESISTANT Resistant     PIP/TAZO <=4 SENSITIVE Sensitive     * >=100,000 COLONIES/mL ESCHERICHIA COLI  SARS Coronavirus 2 by RT PCR (hospital order, performed in [place] hospital lab) Nasopharyngeal Nasopharyngeal Swab     Status: None   Collection Time: 02/05/20  5:48 PM   Specimen: Nasopharyngeal Swab  Result Value Ref Range Status   SARS Coronavirus 2 NEGATIVE NEGATIVE Final    Comment: (NOTE) SARS-CoV-2 target nucleic acids are NOT  DETECTED.  The SARS-CoV-2 RNA is generally detectable in upper and lower respiratory specimens during the acute phase of infection. The lowest concentration of SARS-CoV-2 viral copies this assay can detect is 250 copies / mL. A negative result does not preclude SARS-CoV-2 infection and should not be used as the sole basis for treatment or other patient management decisions.  A negative result may occur with improper specimen collection / handling, submission of specimen other than nasopharyngeal swab, presence of viral mutation(s) within the areas targeted by this assay, and inadequate number of viral copies (<250 copies / mL). A negative result must be combined with clinical observations, patient history, and epidemiological information.  Fact Sheet for Patients:   StrictlyIdeas.no  Fact Sheet for Healthcare Providers: BankingDealers.co.za  This test is not yet approved or  cleared by the [place] FDA and has been authorized for detection and/or diagnosis of SARS-CoV-2 by FDA under an Emergency Use Authorization (EUA).  This EUA will remain in effect (meaning this test can be used) for the duration of the COVID-19 declaration under Section 564(b)(1) of the Act, 21 U.S.C. section 360bbb-3(b)(1), unless the authorization is terminated or revoked sooner.  Performed at [facility], [address], [place],  [address]          Radiology Studies: CT HEMATURIA WORKUP  Result Date: 02/09/2020 CLINICAL DATA:  Recurrent urinary tract infections. EXAM: CT ABDOMEN AND PELVIS WITHOUT AND WITH CONTRAST TECHNIQUE: Multidetector CT imaging of the abdomen and pelvis was performed following the standard protocol before and following the bolus administration of intravenous contrast. CONTRAST:  174mL OMNIPAQUE IOHEXOL 300 MG/ML  SOLN COMPARISON:  None. FINDINGS: Lower chest: Minimal dependent volume loss bilaterally. Coronary  artery calcification. Heart is at the upper limits of normal in size to mildly enlarged. No pericardial or pleural effusion. Distal esophagus is unremarkable. Hepatobiliary: 2.6 cm well-circumscribed low-attenuation lesion in segment 4 of the liver is likely a cyst. Liver and gallbladder are otherwise unremarkable. No biliary ductal dilatation.  Pancreas: Negative. Spleen: Negative. Adrenals/Urinary Tract: Fluid density nodule in the right adrenal gland measures 2.2 cm. Left adrenal gland is unremarkable. Right renal stones measure up to approximately 2.4 x 3.4 cm. Nonenhancing cysts in the right kidney measure up to 3.4 cm. Kidneys are otherwise unremarkable. Distal ureters are poorly opacified, limiting evaluation. Otherwise, no filling defects in the opacified portions of the kidneys, ureters and bladder. Small right posterolateral bladder diverticulum. Stomach/Bowel: Gastric bypass and hiatal hernia repair. Stomach, small bowel and appendix are otherwise unremarkable. Fair amount of stool is seen in the colon, indicative of constipation. Vascular/Lymphatic: IVC filter is in place. Atherosclerotic calcification of the aorta without aneurysm. No pathologically enlarged lymph nodes. Reproductive: Prostate is visualized. Other: No free fluid. Elevated right hemidiaphragm. Locules of air along the ventral abdominal wall indicate subcutaneous injections. Mesenteries and peritoneum are unremarkable. Musculoskeletal: Degenerative changes in the spine and hips. Flowing anterior osteophytosis in the thoracic spine. IMPRESSION: 1. Right renal stones. Distal ureters are poorly opacified, limiting evaluation. No additional findings to explain the patient's symptoms. 2. Right adrenal adenoma. 3. Aortic atherosclerosis (ICD10-I70.0). Coronary artery calcification. Electronically Signed   By: Lorin Picket M.D.   On: 02/09/2020 10:14        Scheduled Meds: . allopurinol  300 mg Oral Q lunch  . brimonidine  1 drop  Right Eye BID  . clonazePAM  0.25 mg Oral BID WC  . clonazePAM  0.5 mg Oral QHS  . enoxaparin (LOVENOX) injection  40 mg Subcutaneous Q12H  . ferrous sulfate  325 mg Oral QODAY  . finasteride  5 mg Oral Q lunch  . furosemide  20 mg Oral Daily  . gabapentin  300 mg Oral BID  . latanoprost  1 drop Right Eye QHS  . mirtazapine  45 mg Oral QHS  . pantoprazole  40 mg Oral Daily  . primidone  50 mg Oral q1600  . primidone  75 mg Oral q AM  . propranolol  40 mg Oral BID  . simvastatin  20 mg Oral QPM  . tamsulosin  0.4 mg Oral QHS  . vitamin B-12  2,500 mcg Oral Daily  . zolpidem  5 mg Oral QHS   Continuous Infusions: . sodium chloride Stopped (02/09/20 1423)  . piperacillin-tazobactam (ZOSYN)  IV 3.375 g (02/10/20 1552)     LOS: 4 days        Hosie Poisson, MD [facility]   To contact the attending provider between 7A-7P or the covering provider during after hours 7P-7A, please log into the web site [email or web address] and access using universal [place] password for that web site. If you do not have the password, please call the hospital operator.  02/10/2020, 6:24 PM

## 2020-02-10 NOTE — TOC Progression Note (Signed)
Transition of Care [facility]) - Progression Note    Patient Details  Name: Curtis Alexander MRN: [id number] Date of Birth: 23-May-1946  [facility] CM/SW Contact  Shelbie Hutching, RN Phone Number: 02/10/2020, 3:03 PM  Clinical Narrative:    Patient is out of Medicare days for SNF and he is unable to pay privately.  Patient has improved since his hospitalization and PT is recommending CIR.  Patient is motivated and would like to do inpatient rehab.  RNCM will cont to follow.     Expected Discharge Plan: IP Rehab Facility Barriers to Discharge: Continued Medical Work up  Expected Discharge Plan and Services Expected Discharge Plan: [place]   Discharge Planning Services: CM Consult Post Acute Care Choice: IP Rehab Living arrangements for the past 2 months: Single Family Home                                       Social Determinants of Health (SDOH) Interventions    Readmission Risk Interventions Readmission Risk Prevention Plan 03/30/2019  Transportation Screening Complete  PCP or Specialist Appt within 5-7 Days Complete  Home Care Screening Complete  Medication Review (RN CM) Complete

## 2020-02-10 NOTE — Progress Notes (Signed)
Rehab Admissions Coordinator Note:  Patient was screened by Cleatrice Burke for appropriateness for an Inpatient Acute Rehab admit at [facility] per PT change in recommendation from SNF to CIR today. I will ask for OT assessment to assist with clarifying all his deficits and needs for more intensive therapy. I will place order per protocol for Rehab consult so that one of our Admission Coordinators can then follow up for full assessment for a possible CIR admit.  Cleatrice Burke RN MSN 02/10/2020, 1:12 PM  I can be reached at [phone].

## 2020-02-10 NOTE — Progress Notes (Signed)
Physical Therapy Treatment Patient Details Name: Curtis Alexander MRN: [id number] DOB: 26-Dec-1945 Today's Date: 02/10/2020    History of Present Illness Curtis Alexander is a 74 y.o. male with medical history significant of essential hypertension, diabetes without complication, GERD, essential tremor, h/o renal stones, recurrent urinary tract infections who presented from home to hospital ED 02/05/2020 with symptoms he reports is how he he feels when he has a UTI, apparently this is the fifth UTI in the last 1 year since August 2020.    PT Comments    Patient received in bed, agreeable to PT session. Patient performed bed mobility with mod independence, heavy use of bed rails and extra time required. Patient requires bed height elevated to perform sit to stand from bed. Patient is able to ambulate with bariatric RW 10 feet, then 8 feet, then 3 feet supervision with close chair follow due to quick fatigue. Patient is motivated to improve mobility so that he can have knee surgery. He was able to transfer from recliner without assistance, but requires momentum. He will continue to benefit from skilled PT while here to improve strength and functional mobility.      Follow Up Recommendations  CIR     Equipment Recommendations  None recommended by PT    Recommendations for Other Services Rehab consult     Precautions / Restrictions Precautions Precautions: Fall Restrictions Weight Bearing Restrictions: No    Mobility  Bed Mobility Overal bed mobility: Modified Independent Bed Mobility: Supine to Sit     Supine to sit: Modified independent (Device/Increase time);HOB elevated     General bed mobility comments: able to come supine to sit with extra time and effort while head of bed is elevated.  Transfers Overall transfer level: Modified independent Equipment used: Rolling walker (2 wheeled) Transfers: Sit to/from Stand Sit to Stand: Modified independent (Device/Increase time);From  elevated surface         General transfer comment: Patient required bed height to be elvated for transfer, but can transfer from recliner without assist. Requires momentum but required no physical assist.  Ambulation/Gait Ambulation/Gait assistance: Min guard;Supervision Gait Distance (Feet): 21 Feet Assistive device: Rolling walker (2 wheeled) Gait Pattern/deviations: Trunk flexed;Wide base of support;Step-to pattern Gait velocity: decr   General Gait Details: Patient able to ambulate with very heavy use of B UE on RW in crouched position due to inability to fully extend knees or hips. Requires very close chair follow due to quick fatigue and lack of ability to continue standing when fatigue hits. He ambulated 10 feet, then 8 feet, then 3 feet with seated rests between reps.   Stairs             Wheelchair Mobility    Modified Rankin (Stroke Patients Only)       Balance Overall balance assessment: Needs assistance Sitting-balance support: Feet supported Sitting balance-Leahy Scale: Good Sitting balance - Comments: steady sitting edge of bed   Standing balance support: Bilateral upper extremity supported;During functional activity Standing balance-Leahy Scale: Fair Standing balance comment: Patient unable to stand without heavy reliance on RW with BUE. Is unable to maintain balance once he fatigues and requires close chair follow during ambulation.                            Cognition Arousal/Alertness: Awake/alert Behavior During Therapy: WFL for tasks assessed/performed Overall Cognitive Status: Within Functional Limits for tasks assessed  Exercises      General Comments        Pertinent Vitals/Pain Pain Assessment: Faces Faces Pain Scale: Hurts a little bit Pain Location: L knee Pain Descriptors / Indicators: Discomfort;Aching Pain Intervention(s): Repositioned    Home Living                       Prior Function            PT Goals (current goals can now be found in the care plan section) Acute Rehab PT Goals Patient Stated Goal: stop getting UTIs and improve mobility, wants to get surgery done on his left knee, but needs to be able to walk 30 feet. PT Goal Formulation: With patient Time For Goal Achievement: 02/21/20 Potential to Achieve Goals: Fair Progress towards PT goals: Progressing toward goals    Frequency    Min 2X/week      PT Plan Discharge plan needs to be updated;Current plan remains appropriate    Co-evaluation              AM-PAC PT "6 Clicks" Mobility   Outcome Measure  Help needed turning from your back to your side while in a flat bed without using bedrails?: A Little Help needed moving from lying on your back to sitting on the side of a flat bed without using bedrails?: A Little Help needed moving to and from a bed to a chair (including a wheelchair)?: A Little Help needed standing up from a chair using your arms (e.g., wheelchair or bedside chair)?: A Lot Help needed to walk in hospital room?: A Little Help needed climbing 3-5 steps with a railing? : Total 6 Click Score: 15    End of Session Equipment Utilized During Treatment: Gait belt Activity Tolerance: Patient limited by fatigue;Patient limited by pain Patient left: in chair;with call bell/phone within reach;with chair alarm set Nurse Communication: Mobility status PT Visit Diagnosis: Muscle weakness (generalized) (M62.81);Difficulty in walking, not elsewhere classified (R26.2)     Time: 9562-1308 PT Time Calculation (min) (ACUTE ONLY): 26 min  Charges:  $Gait Training: 23-37 mins                     Selah Zelman, PT, GCS 02/10/20,12:45 PM

## 2020-02-11 DIAGNOSIS — N390 Urinary tract infection, site not specified: Principal | ICD-10-CM

## 2020-02-11 DIAGNOSIS — D696 Thrombocytopenia, unspecified: Secondary | ICD-10-CM

## 2020-02-11 DIAGNOSIS — Z1612 Extended spectrum beta lactamase (ESBL) resistance: Secondary | ICD-10-CM

## 2020-02-11 DIAGNOSIS — I10 Essential (primary) hypertension: Secondary | ICD-10-CM

## 2020-02-11 DIAGNOSIS — B9629 Other Escherichia coli [E. coli] as the cause of diseases classified elsewhere: Secondary | ICD-10-CM

## 2020-02-11 MED ORDER — LOPERAMIDE HCL 2 MG PO CAPS
2.0000 mg | ORAL_CAPSULE | Freq: Once | ORAL | Status: AC
  Administered 2020-02-11: 2 mg via ORAL
  Filled 2020-02-11: qty 1

## 2020-02-11 NOTE — Progress Notes (Signed)
Inpatient Rehab Admissions:  Inpatient Rehab Consult received.  Discussed case with Dr.Patel.  Feel that pt is mobilizing too well, and too closely to baseline, to warrant CIR admit at this time.  We will sign off and let TOC team know.   Signed: Shann Medal, PT, DPT Admissions Coordinator [phone] 02/11/20  1:39 PM

## 2020-02-11 NOTE — Progress Notes (Signed)
Physical Therapy Treatment Patient Details Name: Curtis Alexander MRN: [id number] DOB: 1946-05-07 Today's Date: 02/11/2020    History of Present Illness Curtis Alexander is a 74 y.o. male with medical history significant of essential hypertension, diabetes without complication, GERD, essential tremor, h/o renal stones, recurrent urinary tract infections who presented from home to hospital ED 02/05/2020 with symptoms he reports is how he he feels when he has a UTI, apparently this is the fifth UTI in the last 1 year since August 2020.    PT Comments    Pt pleasant and motivated to participate during the session. Pt was sitting in recliner at beginning of session and performed LAQ. Pt reports lots of exercises like this that he was given by home health which he does regularly. Pt was able to sit-to-stand with CGA utilizing a significant rocking motion to propel himself forward. Once in standing, pt had a heavy reliance on RW for stability. Pt was able to ambulate ~28ft, then 67ft, then 74ft w/ CGA; pt's gait speed was decreased with a significantly crouched posture due to an inability to fully extend bilateral knees and hips. Pt required a very close chair follow due to quick fatigue and inability to remain standing. Pt additionally required ~66min rest between walks. Pt was able to return to sitting at EOB with CGA; poor eccentric control noted with return to sit. Min A required for bed mobility to help assist legs to bed and help with scooting up to EOB (trendelenburg position also utilized). Pt expressed strong desire and motivation to continue rehab. Pt will benefit from PT services in a CIR setting upon discharge to safely address deficits listed in patient problem list for decreased caregiver assistance and eventual return to PLOF.   Follow Up Recommendations  CIR     Equipment Recommendations  None recommended by PT    Recommendations for Other Services       Precautions / Restrictions  Precautions Precautions: Fall Required Braces or Orthoses:  (Pt states he has a L knee brace at home that he walks better with) Restrictions Weight Bearing Restrictions: No    Mobility  Bed Mobility Overal bed mobility: Needs Assistance (significantly increased time and effort) Bed Mobility: Sit to Supine     Supine to sit: not tested today's session  Sit to supine: Min assist   General bed mobility comments: Min A by PT to help get pt's legs into bed. req bed in trendelenburg to scoot up bed; min A by PT to help with body positioning once in bed.  Transfers Overall transfer level: Needs assistance Equipment used: Rolling walker (2 wheeled) Transfers: Sit to/from Stand Sit to Stand: Min guard         General transfer comment: able to stand from recliner with significant rocking utilized to propel himself into standing. Heavy use of RW once in standing but no physical assist  Ambulation/Gait Ambulation/Gait assistance: Min guard Gait Distance (Feet): 6 Feet x1, 5 Feet x 1, 4 Feet x 1 Assistive device: Rolling walker (2 wheeled) Gait Pattern/deviations: Trunk flexed;Wide base of support;Step-to pattern;Antalgic Gait velocity: decr   General Gait Details: Patient able to ambulate with very heavy use of B UE on RW in significantly crouched position due to inability to fully extend knees or hips. Requires very close chair follow due to quick fatigue and lack of ability to continue standing when fatigue hits. He ambulated 6 feet, then 5 feet, then 4 feet with seated rests between reps.   Stairs  Wheelchair Mobility    Modified Rankin (Stroke Patients Only)       Balance Overall balance assessment: Needs assistance Sitting-balance support: Feet supported;Single extremity supported Sitting balance-Leahy Scale: Good Sitting balance - Comments: steady sitting edge of bed, does require single UE use for support with increased time Postural control:   (difficulties sitting/standing up straight) Standing balance support: Bilateral upper extremity supported;During functional activity Standing balance-Leahy Scale: Fair Standing balance comment: unable to stand without heavy reliance on RW with BUE, requires close chair follow                            Cognition Arousal/Alertness: Awake/alert Behavior During Therapy: WFL for tasks assessed/performed Overall Cognitive Status: Within Functional Limits for tasks assessed                                 General Comments: Pt is A&O x4. He is motivated to participate in therapy and demonstrates great safety awareness.      Exercises Total Joint Exercises Long Arc Quad: AROM;Strengthening;Both;15 reps Other Exercises Other Exercises:    General Comments        Pertinent Vitals/Pain Pain Assessment: No/denies pain    Home Living Family/patient expects to be discharged to:: Private residence Living Arrangements: Children;Other relatives (Daughter and granddaughter (69 yo). Daughter has been having health problems where she cannot push pt in w/c) Available Help at Discharge: Family;Available 24 hours/day Type of Home: House Home Access: Ramped entrance   Home Layout: Two level;Able to live on main level with bedroom/bathroom Home Equipment: Gilford Rile - 2 wheels;Bedside commode;Shower seat;Grab bars - toilet;Wheelchair - manual (lift chair; hospital bed that was his wife's is at his home but daughter is using it currently; also has some sort of chair that wheels into bathroom (narrow enough)) Additional Comments: Per PT note, all living space is in the upper floor. Pt enters this top floor from car port. Can pull himself up from toilet. Rolls a chair into the bathroom to sit during sponge bathing and grooming at the sink.    Prior Function Level of Independence: Needs assistance  See PT Eval    PT Goals (current goals can now be found in the care plan section)  Acute Rehab PT Goals Patient Stated Goal: To be able to stand up longer and improve mobility Progress towards PT goals: Progressing toward goals    Frequency    Min 2X/week      PT Plan Current plan remains appropriate    Co-evaluation              AM-PAC PT "6 Clicks" Mobility   Outcome Measure  Help needed turning from your back to your side while in a flat bed without using bedrails?: A Little Help needed moving from lying on your back to sitting on the side of a flat bed without using bedrails?: A Little Help needed moving to and from a bed to a chair (including a wheelchair)?: A Little Help needed standing up from a chair using your arms (e.g., wheelchair or bedside chair)?: A Little Help needed to walk in hospital room?: A Lot Help needed climbing 3-5 steps with a railing? : Total 6 Click Score: 15    End of Session Equipment Utilized During Treatment: Gait belt Activity Tolerance: Patient limited by fatigue Patient left: in bed;with bed alarm set;with call bell/phone within reach Nurse Communication:  Mobility status PT Visit Diagnosis: Muscle weakness (generalized) (M62.81);Difficulty in walking, not elsewhere classified (R26.2)     Time: 5170-0174 PT Time Calculation (min) (ACUTE ONLY): 26 min  Charges:                        Herbert Moors SPT 02/11/20, 5:39 PM

## 2020-02-11 NOTE — Evaluation (Signed)
Occupational Therapy Evaluation Patient Details Name: Curtis Alexander MRN: [id number] DOB: [birth year] Today's Date: 02/11/2020    History of Present Illness Curtis Alexander is a 74 y.o. male with medical history significant of essential hypertension, diabetes without complication, GERD, essential tremor, h/o renal stones, recurrent urinary tract infections who presented from home to hospital ED 02/05/2020 with symptoms he reports is how he he feels when he has a UTI, apparently this is the fifth UTI in the last 1 year since August 2020.   Clinical Impression   Curtis Alexander presents in bed this morning, pleasant and agreeable to therapy, with Case Manager present and discussing discharge options. 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 Since then, he has had difficulties with static standing endurance, limited mobility and required some assist with ADL, though was making improvements with rehab until recurrent UTIs. Pt currently presents with decreased activity tolerance, large body habitus and generalized weakness resulting in difficulties with LB ADL, functional mobility and ADL transfers. This session, he requires SETUP A for seated UB ADL and grooming, MAX A for LB ADL and close CGA for STS transfers using a RW. He was able to ambulate <6 ft with heavy reliance on bariatric RW and close chair follow. Pt left knee became weak and OT facilitated safe sit in recliner. Pt will continue to benefit from skilled acute OT services in order to address previously mentioned deficits impacting his ADL performance. Pt is motivated to improve strength and level of independence in order to participate meaningfully in his preferred occupations. Given pt functional performance, potential for improvement and motivation, recommend CIR upon discharge.       Follow Up Recommendations  CIR    Equipment Recommendations   Other (comment) (TBD at next venue of care)    Recommendations for Other Services       Precautions / Restrictions Precautions Precautions: Fall Required Braces or Orthoses:  (pt reports he has a left knee brace at home) Restrictions Weight Bearing Restrictions: No      Mobility Bed Mobility Overal bed mobility: Modified Independent Bed Mobility: Supine to Sit     Supine to sit: Modified independent (Device/Increase time);HOB elevated     General bed mobility comments: able to come supine to sit with extra time and effort while head of bed is elevated and heavy bed rail use  Transfers Overall transfer level: Needs assistance Equipment used: Rolling walker (2 wheeled) Transfers: Sit to/from Stand Sit to Stand: From elevated surface;Min guard         General transfer comment: Requires elevated bed height to stand with heavy RW use once in standing, can transfer from recliner, neither requiring physical assist.    Balance Overall balance assessment: Needs assistance Sitting-balance support: Feet supported;Single extremity supported Sitting balance-Leahy Scale: Good Sitting balance - Comments: steady sitting edge of bed, does require single UE use for support with increased time Postural control:  (difficulties sitting/standing up straight) Standing balance support: Bilateral upper extremity supported;During functional activity   Standing balance comment: unable to stand without heavy reliance on RW with BUE, requires close chair follow                           ADL either performed or assessed with clinical judgement   ADL Overall ADL's : Needs assistance/impaired     Grooming: Oral care;Set up;Sitting  Upper Body Dressing : Set up;Sitting Upper Body Dressing Details (indicate cue type and reason): VC's for button snaps                 Functional mobility during ADLs: Min guard;Rolling walker (bariatric walker; requires very close  chair follow) General ADL Comments: Pt with difficulties accessing LB for ADL. Requires MAX A for donning socks (uses a sock aid at home). Requires close CGAand heavy bariatric RW use for ambulating 4 steps to recliner. Able to ambulate ~5 feet across the room with chair follow before needing to sit.     Vision Baseline Vision/History: Wears glasses Wears Glasses: Reading only       Perception     Praxis      Pertinent Vitals/Pain Pain Assessment: No/denies pain     Hand Dominance Left   Extremity/Trunk Assessment Upper Extremity Assessment Upper Extremity Assessment: Generalized weakness;RUE deficits/detail;LUE deficits/detail (baseline tremors in BUE) RUE Deficits / Details: 3+/5 shoulder flexion, grossly 4-/5 otherwise, has a chronic rotator cuff injury LUE Deficits / Details: grossly 4/5   Lower Extremity Assessment RLE Deficits / Details: difficulties with knee extension in standing, history of TKA and patellar rupture LLE Deficits / Details: difficulties with knee extension in standing   Cervical / Trunk Assessment Cervical / Trunk Assessment: Kyphotic   Communication Communication Communication: No difficulties   Cognition Arousal/Alertness: Awake/alert Behavior During Therapy: WFL for tasks assessed/performed Overall Cognitive Status: Within Functional Limits for tasks assessed                                 General Comments: Pt is A&O x4. He is motivated to participate in therapy and demonstrates great safety awareness.   General Comments       Exercises Other Exercises Other Exercises: Educated pt re: role of OT in acute care, discharge planning and recommendations, safe DME use during ADL transfers and falls prevention strategies. Other Exercises: Facilitated safe STS transfers and ambulation using RW in preparation for toilet transfers and functional mobility.   Shoulder Instructions      Home Living Family/patient expects to be  discharged to:: Private residence Living Arrangements: Children;Other relatives (Daughter and granddaughter (26 yo). Daughter has been having health problems where she cannot push pt in w/c) Available Help at Discharge: Family;Available 24 hours/day Type of Home: House Home Access: Ramped entrance     Home Layout: Two level;Able to live on main level with bedroom/bathroom     Bathroom Shower/Tub: Tub/shower unit (Uses chair to roll into bathroom for sink bath)   Bathroom Toilet: Handicapped height (about 1-2 inch than normal commode, no adapter)     Home Equipment: Walker - 2 wheels;Bedside commode;Shower seat;Grab bars - toilet;Wheelchair - manual (lift chair; hospital bed that was his wife's is at his home but daughter is using it currently; also has some sort of chair that wheels into bathroom (narrow enough))   Additional Comments: Per PT note, all living space is in the upper floor. Pt enters this top floor from car port. Can pull himself up from toilet. Rolls a chair into the bathroom to sit during sponge bathing and grooming at the sink.      Prior Functioning/Environment Level of Independence: Needs assistance  Gait / Transfers Assistance Needed: Pt reports much improved ambulation with RW after rehab earlier this year. He primarily uses a bariatric RW at home, but is very limited in distances. Sleeps in  lazy boy lift chair and transfers best from elevated surfaces. Has had 5 falls in the past year. ADL's / Homemaking Assistance Needed: Pt reports Setup A for sponge bathing and dressing. He uses a sock aide. Requires assistance to get into bathroom to transfer to commode for BM. Uses urinal otherwise. Daughter assists pt with meal prep, household chores, driving, etc.   Comments: Patient reports he had R knee surgery last Aug and then a patellar tendon rupture on the same knee in September. He went to rehab and was able to walk 30 feet with the RW. He has had 5 UTIs since that  surgery. Since his surgery, he has had difficulty with static standing endurance, but prior to this hospitalization, he was able to ambulate 17-18 feet in his home using the walker with the home PT following him with a w/c.        OT Problem List: Decreased strength;Decreased range of motion;Decreased activity tolerance;Impaired balance (sitting and/or standing);Decreased knowledge of use of DME or AE;Obesity      OT Treatment/Interventions: Self-care/ADL training;Therapeutic exercise;Therapeutic activities;Energy conservation;DME and/or AE instruction;Patient/family education;Balance training    OT Goals(Current goals can be found in the care plan section) Acute Rehab OT Goals Patient Stated Goal: To be able to stand up longer and improve mobility OT Goal Formulation: With patient Time For Goal Achievement: 02/25/20 Potential to Achieve Goals: Good ADL Goals Pt Will Perform Lower Body Dressing: with supervision;with adaptive equipment;sit to/from stand (using LRAD) Pt Will Transfer to Toilet: with supervision;ambulating;bedside commode (bariatric BSC; using LRAD; without knees buckling) Pt Will Perform Toileting - Clothing Manipulation and hygiene: with adaptive equipment;with supervision;sitting/lateral leans (using LRAD)  OT Frequency: Min 3X/week   Barriers to D/C:            Co-evaluation              AM-PAC OT "6 Clicks" Daily Activity     Outcome Measure Help from another person eating meals?: None Help from another person taking care of personal grooming?: A Little Help from another person toileting, which includes using toliet, bedpan, or urinal?: A Lot Help from another person bathing (including washing, rinsing, drying)?: A Lot Help from another person to put on and taking off regular upper body clothing?: A Little Help from another person to put on and taking off regular lower body clothing?: A Lot 6 Click Score: 16   End of Session Equipment Utilized During  Treatment: Gait belt;Rolling walker Nurse Communication: Mobility status;Other (comment) (change bed sheets)  Activity Tolerance: Patient tolerated treatment well Patient left: in chair;with call bell/phone within reach;with chair alarm set  OT Visit Diagnosis: Unsteadiness on feet (R26.81);Other abnormalities of gait and mobility (R26.89);Repeated falls (R29.6);History of falling (Z91.81);Muscle weakness (generalized) (M62.81);Other symptoms and signs involving the nervous system (R29.898)                Time: 1110-1150 OT Time Calculation (min): 40 min Charges:  OT General Charges $OT Visit: 1 Visit OT Evaluation $OT Eval Moderate Complexity: 1 Mod OT Treatments $Self Care/Home Management : 8-22 mins $Therapeutic Activity: 8-22 mins   Jerilynn Birkenhead, OTS 02/11/20, 1:06 PM

## 2020-02-11 NOTE — Progress Notes (Signed)
PROGRESS NOTE    Curtis Alexander  OEU:[id number] DOB: 08-May-1946 DOA: 02/05/2020 PCP: [name]   Brief Narrative: Curtis Alexander is a 74 y.o. malewith medical history significant ofessential hypertension, diabetes without complication, GERD, essential tremor,h/o renal stones,recurrent urinary tract infections. Patient presented secondary to recurrent UTI (fifth UTI in 1 year). History of ESBL E. Coli with recurrent ESBL E. Coli infection. Managed on IV antibiotics.   Assessment & Plan:   Active Problems:   UTI (urinary tract infection)   Recurrent ESBL E. Coli UTI Urology consulted and recommending outpatient cystoscopy. Initially managed on meropenem and transitioned to Zosyn. No blood cultures obtained. -Continue Zosyn IV -Outpatient follow-up with Dr. Ashok Cordia (Urology) for cystoscopy  Right adrenal adenoma -Outpatient follow-up  GERD -Continue Protonix  Essential hypertension -Continue Propranolol  Diabetes mellitus, type 2 Listed in chart history however last hemoglobin A1C is 4.8% and patient is not on outpatient regimen for treatment.  Chronic diastolic heart failure Euvolemic. -Continue lasix  Thrombocytopenia Mild. In setting of acute infection. Resolving.  Hypoxia Resolved. Initially required oxygen but weaned to room air. Possibly secondary to sleep apnea. May benefit from sleep study as an outpatient.  Hyperlipidemia -Continue simvastatin  Gout -Continue allopurinol  Morbid obesity Body mass index is 44.09 kg/m.    DVT prophylaxis: Lovenox Code Status:   Code Status: Full Code Family Communication: None Disposition Plan: Discharge to SNF vs [facility]. Likely discharge tomorrow pending completion of IV antibiotics in addition to placement bed.   Consultants:   None  Procedures:   None  Antimicrobials:  Meropenem  Zosyn    Subjective: No issues today.  Objective: Vitals:   02/10/20 1709 02/10/20 2231 02/10/20 2335  02/11/20 0428  BP: 111/61 (!) 110/52 113/61 120/73  Pulse: 61 70 78 88  Resp: 17 16 20 16   Temp: 98.2 F (36.8 C) 98.4 F (36.9 C) 98.4 F (36.9 C) 97.9 F (36.6 C)  TempSrc:   Oral Oral  SpO2: 93% 93% 90% 93%  Weight:      Height:        Intake/Output Summary (Last 24 hours) at 02/11/2020 0741 Last data filed at 02/11/2020 0521 Gross per 24 hour  Intake --  Output 775 ml  Net -775 ml   Filed Weights   02/05/20 1424  Weight: 131.5 kg    Examination:  General exam: Appears calm and comfortable Respiratory system: Clear to auscultation. Respiratory effort normal. Cardiovascular system: S1 & S2 heard, RRR. No murmurs, rubs, gallops or clicks. Gastrointestinal system: Abdomen is nondistended, soft and nontender. No organomegaly or masses felt. Normal bowel sounds heard. Central nervous system: Alert and oriented. No focal neurological deficits. Musculoskeletal: No calf tenderness Skin: No cyanosis. No rashes Psychiatry: Judgement and insight appear normal. Mood & affect appropriate.     Data Reviewed: I have personally reviewed following labs and imaging studies  CBC Lab Results  Component Value Date   WBC 5.0 02/09/2020   RBC 4.24 02/09/2020   HGB 13.0 02/09/2020   HCT 41.0 02/09/2020   MCV 96.7 02/09/2020   MCH 30.7 02/09/2020   PLT 121 (L) 02/09/2020   MCHC 31.7 02/09/2020   RDW 14.1 02/09/2020   LYMPHSABS 1.8 02/09/2020   MONOABS 0.7 02/09/2020   EOSABS 0.1 02/09/2020   BASOSABS 0.0 15/40/0867     Last metabolic panel Lab Results  Component Value Date   NA 141 02/08/2020   K 4.4 02/08/2020   CL 96 (L) 02/08/2020   CO2 33 (H) 02/08/2020  BUN 19 02/08/2020   CREATININE 0.65 02/08/2020   GLUCOSE 102 (H) 02/08/2020   GFRNONAA >60 02/08/2020   GFRAA >60 02/08/2020   CALCIUM 8.6 (L) 02/08/2020   PROT 5.5 (L) 02/06/2020   ALBUMIN 2.9 (L) 02/06/2020   BILITOT 0.6 02/06/2020   ALKPHOS 69 02/06/2020   AST 17 02/06/2020   ALT 12 02/06/2020    ANIONGAP 12 02/08/2020    CBG (last 3)  No results for input(s): GLUCAP in the last 72 hours.   GFR: Estimated Creatinine Clearance: 105.6 mL/min (by C-G formula based on SCr of 0.65 mg/dL).  Coagulation Profile: No results for input(s): INR, PROTIME in the last 168 hours.  Recent Results (from the past 240 hour(s))  Urine culture     Status: Abnormal   Collection Time: 02/05/20  2:29 PM   Specimen: Urine, Random  Result Value Ref Range Status   Specimen Description   Final    URINE, RANDOM Performed at [facility], [address], [place], [place] [address]    Special Requests   Final    NONE Performed at [facility], [address] [place], [place] [address]    Culture (A)  Final    >=100,000 COLONIES/mL ESCHERICHIA COLI Confirmed Extended Spectrum Beta-Lactamase Producer (ESBL).  In bloodstream infections from ESBL organisms, carbapenems are preferred over piperacillin/tazobactam. They are shown to have a lower risk of mortality.    Report Status 02/08/2020 FINAL  Final   Organism ID, Bacteria ESCHERICHIA COLI (A)  Final      Susceptibility   Escherichia coli - MIC*    AMPICILLIN >=32 RESISTANT Resistant     CEFAZOLIN >=64 RESISTANT Resistant     CEFTRIAXONE >=64 RESISTANT Resistant     CIPROFLOXACIN >=4 RESISTANT Resistant     GENTAMICIN <=1 SENSITIVE Sensitive     IMIPENEM <=0.25 SENSITIVE Sensitive     NITROFURANTOIN 128 RESISTANT Resistant     TRIMETH/SULFA >=320 RESISTANT Resistant     AMPICILLIN/SULBACTAM >=32 RESISTANT Resistant     PIP/TAZO <=4 SENSITIVE Sensitive     * >=100,000 COLONIES/mL ESCHERICHIA COLI  SARS Coronavirus 2 by RT PCR (hospital order, performed in [place] hospital lab) Nasopharyngeal Nasopharyngeal Swab     Status: None   Collection Time: 02/05/20  5:48 PM   Specimen: Nasopharyngeal Swab  Result Value Ref Range Status   SARS Coronavirus 2 NEGATIVE NEGATIVE Final    Comment: (NOTE) SARS-CoV-2 target nucleic  acids are NOT DETECTED.  The SARS-CoV-2 RNA is generally detectable in upper and lower respiratory specimens during the acute phase of infection. The lowest concentration of SARS-CoV-2 viral copies this assay can detect is 250 copies / mL. A negative result does not preclude SARS-CoV-2 infection and should not be used as the sole basis for treatment or other patient management decisions.  A negative result may occur with improper specimen collection / handling, submission of specimen other than nasopharyngeal swab, presence of viral mutation(s) within the areas targeted by this assay, and inadequate number of viral copies (<250 copies / mL). A negative result must be combined with clinical observations, patient history, and epidemiological information.  Fact Sheet for Patients:   StrictlyIdeas.no  Fact Sheet for Healthcare Providers: BankingDealers.co.za  This test is not yet approved or  cleared by the [place] FDA and has been authorized for detection and/or diagnosis of SARS-CoV-2 by FDA under an Emergency Use Authorization (EUA).  This EUA will remain in effect (meaning this test can be used) for  the duration of the COVID-19 declaration under Section 564(b)(1) of the Act, 21 U.S.C. section 360bbb-3(b)(1), unless the authorization is terminated or revoked sooner.  Performed at [facility], [address], [place], [place] [address]         Radiology Studies: CT HEMATURIA WORKUP  Result Date: 02/09/2020 CLINICAL DATA:  Recurrent urinary tract infections. EXAM: CT ABDOMEN AND PELVIS WITHOUT AND WITH CONTRAST TECHNIQUE: Multidetector CT imaging of the abdomen and pelvis was performed following the standard protocol before and following the bolus administration of intravenous contrast. CONTRAST:  17mL OMNIPAQUE IOHEXOL 300 MG/ML  SOLN COMPARISON:  None. FINDINGS: Lower chest: Minimal dependent volume loss  bilaterally. Coronary artery calcification. Heart is at the upper limits of normal in size to mildly enlarged. No pericardial or pleural effusion. Distal esophagus is unremarkable. Hepatobiliary: 2.6 cm well-circumscribed low-attenuation lesion in segment 4 of the liver is likely a cyst. Liver and gallbladder are otherwise unremarkable. No biliary ductal dilatation. Pancreas: Negative. Spleen: Negative. Adrenals/Urinary Tract: Fluid density nodule in the right adrenal gland measures 2.2 cm. Left adrenal gland is unremarkable. Right renal stones measure up to approximately 2.4 x 3.4 cm. Nonenhancing cysts in the right kidney measure up to 3.4 cm. Kidneys are otherwise unremarkable. Distal ureters are poorly opacified, limiting evaluation. Otherwise, no filling defects in the opacified portions of the kidneys, ureters and bladder. Small right posterolateral bladder diverticulum. Stomach/Bowel: Gastric bypass and hiatal hernia repair. Stomach, small bowel and appendix are otherwise unremarkable. Fair amount of stool is seen in the colon, indicative of constipation. Vascular/Lymphatic: IVC filter is in place. Atherosclerotic calcification of the aorta without aneurysm. No pathologically enlarged lymph nodes. Reproductive: Prostate is visualized. Other: No free fluid. Elevated right hemidiaphragm. Locules of air along the ventral abdominal wall indicate subcutaneous injections. Mesenteries and peritoneum are unremarkable. Musculoskeletal: Degenerative changes in the spine and hips. Flowing anterior osteophytosis in the thoracic spine. IMPRESSION: 1. Right renal stones. Distal ureters are poorly opacified, limiting evaluation. No additional findings to explain the patient's symptoms. 2. Right adrenal adenoma. 3. Aortic atherosclerosis (ICD10-I70.0). Coronary artery calcification. Electronically Signed   By: Lorin Picket M.D.   On: 02/09/2020 10:14        Scheduled Meds: . allopurinol  300 mg Oral Q lunch  .  brimonidine  1 drop Right Eye BID  . clonazePAM  0.25 mg Oral BID WC  . clonazePAM  0.5 mg Oral QHS  . enoxaparin (LOVENOX) injection  40 mg Subcutaneous Q12H  . ferrous sulfate  325 mg Oral QODAY  . finasteride  5 mg Oral Q lunch  . furosemide  20 mg Oral Daily  . gabapentin  300 mg Oral BID  . latanoprost  1 drop Right Eye QHS  . mirtazapine  45 mg Oral QHS  . pantoprazole  40 mg Oral Daily  . primidone  50 mg Oral q1600  . primidone  75 mg Oral q AM  . propranolol  40 mg Oral BID  . simvastatin  20 mg Oral QPM  . tamsulosin  0.4 mg Oral QHS  . vitamin B-12  2,500 mcg Oral Daily  . zolpidem  5 mg Oral QHS   Continuous Infusions: . sodium chloride Stopped (02/09/20 1423)  . piperacillin-tazobactam (ZOSYN)  IV 3.375 g (02/11/20 0500)     LOS: 5 days     Cordelia Poche, MD [facility] 02/11/2020, 7:41 AM  If 7PM-7AM, please contact night-coverage [email or web address]

## 2020-02-12 DIAGNOSIS — I5032 Chronic diastolic (congestive) heart failure: Secondary | ICD-10-CM

## 2020-02-12 DIAGNOSIS — E785 Hyperlipidemia, unspecified: Secondary | ICD-10-CM

## 2020-02-12 MED ORDER — CLONAZEPAM 0.5 MG PO TABS: ORAL_TABLET | ORAL | 0 refills | Status: AC

## 2020-02-12 MED ORDER — LOPERAMIDE HCL 2 MG PO CAPS
4.0000 mg | ORAL_CAPSULE | Freq: Once | ORAL | Status: AC
  Administered 2020-02-12: 4 mg via ORAL
  Filled 2020-02-12: qty 2

## 2020-02-12 MED ORDER — ZOLPIDEM TARTRATE 5 MG PO TABS: 5.0000 mg | ORAL_TABLET | Freq: Every day | ORAL | 0 refills | Status: DC

## 2020-02-12 NOTE — Progress Notes (Signed)
This RN called [facility] [place] and gave report to [place], receiving Therapist, sports.

## 2020-02-12 NOTE — Progress Notes (Signed)
Patient refused lasix medication because he states the urinal is too inconvenient to use. Patient educated about purpose for medication and verbalized understanding. Patient restated he did not want to take medication. Will continue to monitor.

## 2020-02-12 NOTE — TOC Progression Note (Addendum)
Transition of Care [facility]) - Progression Note    Patient Details  Name: Curtis Alexander MRN: [id number] Date of Birth: [birth year]  Transition of Care [facility]) CM/SW Contact  Shelbie Hutching, RN Phone Number: 02/12/2020, 10:28 AM  Clinical Narrative:    [facility] was unable to accept patient for rehab.  Patient would like to go to [facility], he agrees to pay privately for the room and board and [facility] will bill Medicare part B for his therapy.  Patient has been vaccinated against COVID.   Patient's daughter has been updated.  Pasrr is [id number] A expires Aug 10th 2021.  Expected Discharge Plan: [place] Barriers to Discharge: Barriers Resolved  Expected Discharge Plan and Services Expected Discharge Plan: [place]   Discharge Planning Services: CM Consult Post Acute Care Choice: [place] Living arrangements for the past 2 months: Single Family Home                                       Social Determinants of Health (SDOH) Interventions    Readmission Risk Interventions Readmission Risk Prevention Plan 03/30/2019  Transportation Screening Complete  PCP or Specialist Appt within 5-7 Days Complete  Home Care Screening Complete  Medication Review (RN CM) Complete

## 2020-02-12 NOTE — Progress Notes (Signed)
[facility] called for patient transport to [facility].

## 2020-02-12 NOTE — Progress Notes (Signed)
Patient discharged via [facility] to [facility]

## 2020-02-12 NOTE — Care Management Important Message (Addendum)
Important Message  Patient Details  Name: Curtis Alexander MRN: [id number] Date of Birth: [birth year]   Medicare Important Message Given:  Yes  Reviewed with patient via room phone due to isolation status.   Dannette Barbara 02/12/2020, 1:09 PM

## 2020-02-12 NOTE — TOC Progression Note (Signed)
Transition of Care [facility]) - Progression Note    Patient Details  Name: Curtis Alexander MRN: [id number] Date of Birth: 1946/02/16  Transition of Care [facility]) CM/SW Contact  Shelbie Hutching, RN Phone Number: 02/12/2020, 4:45 PM  Clinical Narrative:    Patient would like to appeal his discharge.  Patient will call [facility].    Expected Discharge Plan: Skilled Nursing Facility Barriers to Discharge: Barriers Resolved  Expected Discharge Plan and Services Expected Discharge Plan: [place]   Discharge Planning Services: CM Consult Post Acute Care Choice: [place] Living arrangements for the past 2 months: Single Family Home Expected Discharge Date: 02/12/20                                     Social Determinants of Health (SDOH) Interventions    Readmission Risk Interventions Readmission Risk Prevention Plan 03/30/2019  Transportation Screening Complete  PCP or Specialist Appt within 5-7 Days Complete  Home Care Screening Complete  Medication Review (RN CM) Complete

## 2020-02-12 NOTE — TOC Transition Note (Signed)
Transition of Care [facility]) - CM/SW Discharge Note   Patient Details  Name: Curtis Alexander MRN: [id number] Date of Birth: 01/18/1946  Transition of Care [facility]) CM/SW Contact:  Shelbie Hutching, RN Phone Number: 02/12/2020, 3:35 PM   Clinical Narrative:     Patient will discharge to [facility] today room 805.  Bedside RN will call report to [phone].  Patient will be paying privately for the room and board and Medicare part B will cover his therapy.  Patient's daughter is aware of discharge plan. Patient will transport via [employer].   Final next level of care: Skilled Nursing Facility Barriers to Discharge: Barriers Resolved   Patient Goals and CMS Choice Patient states their goals for this hospitalization and ongoing recovery are:: Patient would like to go to [facility] CMS Medicare.gov Compare Post Acute Care list provided to:: Patient Choice offered to / list presented to : Patient  Discharge Placement              Patient chooses bed at: [facility] [place] Patient to be transferred to facility by: [facility] Name of family member notified: [name]- daughter Patient and family notified of of transfer: 02/12/20  Discharge Plan and Services   Discharge Planning Services: CM Consult Post Acute Care Choice: [place]                               Social Determinants of Health ([place]) Interventions     Readmission Risk Interventions Readmission Risk Prevention Plan 03/30/2019  Transportation Screening Complete  PCP or Specialist Appt within 5-7 Days Complete  Home Care Screening Complete  Medication Review (RN CM) Complete

## 2020-02-12 NOTE — Discharge Summary (Addendum)
Physician Discharge Summary  Curtis Alexander JKD:[id number] DOB: 11/30/1945 DOA: 02/05/2020  PCP: [name]  Admit date: 02/05/2020 Discharge date: 02/12/2020  Admitted From: Home Disposition: SNF  Recommendations for Outpatient Follow-up:  1. Follow up with PCP in 1 week 2. Follow up with Urology in 1 week for cystoscopy 3. Please follow up on the following pending results: None  Equipment/Devices: None  Discharge Condition: Stable CODE STATUS: Full code Diet recommendation: Heart healthy   Brief/Interim Summary:  Admission HPI written by Hosie Poisson, MD   Chief Complaint: fatigue since 2 days.   HPI: Curtis Alexander is a 74 y.o. male with medical history significant of essential hypertension, diabetes without complication, GERD, essential tremor, h/o renal stones, recurrent urinary tract infections, apparently this is the fifth UTI in the last 1 year since August 2020 presents to ED for fatigue 2 to 3 days, associated with nausea, foul smelling urine and mild abdominal discomfort since 2 to 3 days. . Pt denies fever, chills, sob, chest pain, vomiting, diarrhea, back pain, headache, dizziness, palpitations, leg edema, syncope, hematemesis or hematuria in the last one week . Pt reports this is how he feels whenever he has UTI.  He was recently by Dr Bernardo Heater with urology for recurrent UTI'S, and was to be scheduled for CT urogram and cystoscopy.  ED Course: on arrival to ED, pt afebrile, mildly hypoxic with oxygen sats at 88 to 90% on room air, normotensive, heart rate of 63/min and respiratory rate of 18/min. Labs show sodium of 143, bicarb of 33, BUN of 25, creatinine of 0.83, hemoglobin of 13.9, platelets of 134. COVID-19 is pending.  Urine analysis shows cloudy urine with moderate leukocytes with positive nitrites and many bacteria.  He was referred to [facility] for evaluation and management of recurrent urinary tract infection.   Hospital course:  Recurrent ESBL E.  Coli UTI Urology consulted and recommending outpatient cystoscopy. Initially managed on meropenem and transitioned to Zosyn. No blood cultures obtained. Completed 7 days of antibiotics prior to discharge. Recommendation for outpatient follow-up with Dr. Bernardo Heater (Urology) for cystoscopy to evaluate for recurrent UTI.  Right adrenal adenoma Outpatient follow-up  GERD Continue Protonix  Essential hypertension Continue Propranolol  Diabetes mellitus, type 2 Listed in chart history however last hemoglobin A1C is 4.8% and patient is not on outpatient regimen for treatment.  Chronic diastolic heart failure Euvolemic. Continue lasix  Thrombocytopenia Mild. In setting of acute infection. Resolving.  Hypoxia Resolved. Initially required oxygen but weaned to room air. Possibly secondary to sleep apnea. May benefit from sleep study as an outpatient.  Hyperlipidemia Continue simvastatin  Gout Continue allopurinol  Insomnia Continue Ambien  Anxiety Continue clonazepam  Diarrhea New since 7/21. Per patient, possibly related to dinner. No abdominal tenderness, fever of leukocytosis. In setting of Zosyn. Likely related to antibiotics. No red flags for infection. Recommend supportive care only at this time.  Morbid obesity Body mass index is 44.09 kg/m.  Discharge Diagnoses:  Principal Problem:   Urinary tract infection due to extended-spectrum beta lactamase (ESBL) producing Escherichia coli Active Problems:   Diabetes mellitus, type II (HCC)   Hypertension   Obesity, Class III, BMI 40-49.9 (morbid obesity) (HCC)   Hyperlipidemia   Chronic diastolic CHF (congestive heart failure) [facility])    Discharge Instructions  Discharge Instructions    Increase activity slowly   Complete by: As directed      Allergies as of 02/12/2020   No Known Allergies     Medication List  TAKE these medications   allopurinol 300 MG tablet Commonly known as: ZYLOPRIM Take 300 mg  by mouth daily with lunch.   aspirin EC 81 MG tablet Take 81 mg by mouth daily. Swallow whole.   B-12 2500 MCG Tabs Take 2,500 mcg by mouth daily.   brimonidine 0.2 % ophthalmic solution Commonly known as: ALPHAGAN Place 1 drop into the right eye 2 (two) times daily.   clonazePAM 0.5 MG tablet Commonly known as: KLONOPIN Take 0.25 in the morning, 0.25 at lunch, 0.5 mg at bedtime What changed: when to take this   Cranberry 250 MG Tabs Take 250 mg by mouth daily.   cyclobenzaprine 10 MG tablet Commonly known as: FLEXERIL Take 10 mg by mouth 2 (two) times daily.   ferrous sulfate 325 (65 FE) MG tablet Take 1 tablet (325 mg total) by mouth 2 (two) times daily with a meal. What changed: when to take this   finasteride 5 MG tablet Commonly known as: PROSCAR Take 5 mg by mouth daily with lunch.   furosemide 20 MG tablet Commonly known as: Lasix Take 1 tablet (20 mg total) by mouth daily.   gabapentin 300 MG capsule Commonly known as: NEURONTIN Take 300 mg by mouth 2 (two) times daily.   latanoprost 0.005 % ophthalmic solution Commonly known as: XALATAN Place 1 drop into the right eye at bedtime.   loratadine 10 MG tablet Commonly known as: CLARITIN Take 10 mg by mouth daily as needed for allergies.   mirtazapine 45 MG tablet Commonly known as: REMERON Take 45 mg by mouth at bedtime.   nystatin powder Commonly known as: MYCOSTATIN/NYSTOP Apply topically 2 (two) times daily. Apply to abdominal folds /chest wall crease   omeprazole 40 MG capsule Commonly known as: PRILOSEC Take 40 mg by mouth daily with lunch.   primidone 50 MG tablet Commonly known as: MYSOLINE Take 50-75 mg by mouth See admin instructions. Take 1 tablets (75mg ) by mouth every morning and take 1 tablet (50mg ) by mouth every afternoon   propranolol 40 MG tablet Commonly known as: INDERAL Take 40 mg by mouth 2 (two) times daily.   simvastatin 20 MG tablet Commonly known as: ZOCOR Take 1  tablet by mouth every evening.   tamsulosin 0.4 MG Caps capsule Commonly known as: FLOMAX Take 0.4 mg by mouth at bedtime.   Vitamin D 50 MCG (2000 UT) Caps Take 2,000 Units by mouth daily.   zolpidem 5 MG tablet Commonly known as: AMBIEN Take 1 tablet (5 mg total) by mouth at bedtime.       Contact information for follow-up providers    [name]. Schedule an appointment as soon as possible for a visit in 1 week(s).   Specialty: Family Medicine Why: Hospital follow-up Contact information: [address] [place] [address] [phone]            Contact information for after-discharge care    Destination    [facility] Preferred SNF .   Service: Skilled Nursing Contact information: [address] [place] [phone]                 No Known Allergies  Consultations:  Urology (curbside)   Procedures/Studies: DG Chest 2 View  Result Date: 02/05/2020 CLINICAL DATA:  Weakness, fatigue, urinary tract infection EXAM: CHEST - 2 VIEW COMPARISON:  01/09/2020 FINDINGS: Lung volumes are small, but are stable since prior examination. Unchanged mild elevation of the right hemidiaphragm. Lungs  are clear. No pneumothorax or pleural effusion. Cardiac size is within normal limits. The thoracic aorta appears ectatic, unchanged from prior examination. No acute bone abnormality. IMPRESSION: No active cardiopulmonary disease. Electronically Signed   By: Fidela Salisbury MD   On: 02/05/2020 18:58   CT HEMATURIA WORKUP  Result Date: 02/09/2020 CLINICAL DATA:  Recurrent urinary tract infections. EXAM: CT ABDOMEN AND PELVIS WITHOUT AND WITH CONTRAST TECHNIQUE: Multidetector CT imaging of the abdomen and pelvis was performed following the standard protocol before and following the bolus administration of intravenous contrast. CONTRAST:  166mL OMNIPAQUE IOHEXOL 300 MG/ML  SOLN COMPARISON:  None. FINDINGS: Lower chest: Minimal  dependent volume loss bilaterally. Coronary artery calcification. Heart is at the upper limits of normal in size to mildly enlarged. No pericardial or pleural effusion. Distal esophagus is unremarkable. Hepatobiliary: 2.6 cm well-circumscribed low-attenuation lesion in segment 4 of the liver is likely a cyst. Liver and gallbladder are otherwise unremarkable. No biliary ductal dilatation. Pancreas: Negative. Spleen: Negative. Adrenals/Urinary Tract: Fluid density nodule in the right adrenal gland measures 2.2 cm. Left adrenal gland is unremarkable. Right renal stones measure up to approximately 2.4 x 3.4 cm. Nonenhancing cysts in the right kidney measure up to 3.4 cm. Kidneys are otherwise unremarkable. Distal ureters are poorly opacified, limiting evaluation. Otherwise, no filling defects in the opacified portions of the kidneys, ureters and bladder. Small right posterolateral bladder diverticulum. Stomach/Bowel: Gastric bypass and hiatal hernia repair. Stomach, small bowel and appendix are otherwise unremarkable. Fair amount of stool is seen in the colon, indicative of constipation. Vascular/Lymphatic: IVC filter is in place. Atherosclerotic calcification of the aorta without aneurysm. No pathologically enlarged lymph nodes. Reproductive: Prostate is visualized. Other: No free fluid. Elevated right hemidiaphragm. Locules of air along the ventral abdominal wall indicate subcutaneous injections. Mesenteries and peritoneum are unremarkable. Musculoskeletal: Degenerative changes in the spine and hips. Flowing anterior osteophytosis in the thoracic spine. IMPRESSION: 1. Right renal stones. Distal ureters are poorly opacified, limiting evaluation. No additional findings to explain the patient's symptoms. 2. Right adrenal adenoma. 3. Aortic atherosclerosis (ICD10-I70.0). Coronary artery calcification. Electronically Signed   By: Lorin Picket M.D.   On: 02/09/2020 10:14     Subjective: Some nausea last night with  diarrhea. Improved today. He thinks likely secondary to the dinner he ate.  Discharge Exam: Vitals:   02/12/20 0750 02/12/20 1211  BP: (!) 121/57 (!) 108/64  Pulse: 85 83  Resp: 16 22  Temp: 99.2 F (37.3 C) 98 F (36.7 C)  SpO2: 95% 94%   Vitals:   02/11/20 2306 02/12/20 0403 02/12/20 0750 02/12/20 1211  BP: 112/66 (!) 121/59 (!) 121/57 (!) 108/64  Pulse: 66 78 85 83  Resp: 18 18 16 22   Temp: 99.9 F (37.7 C) 98.4 F (36.9 C) 99.2 F (37.3 C) 98 F (36.7 C)  TempSrc: Oral Oral Oral Oral  SpO2: 93% (!) 88% 95% 94%  Weight:      Height:        General: Pt is alert, awake, not in acute distress Cardiovascular: RRR, S1/S2 +, no rubs, no gallops Respiratory: CTA bilaterally, no wheezing, no rhonchi Abdominal: Soft, NT, ND, bowel sounds + Extremities: no cyanosis    The results of significant diagnostics from this hospitalization (including imaging, microbiology, ancillary and laboratory) are listed below for reference.     Microbiology: Recent Results (from the past 240 hour(s))  Urine culture     Status: Abnormal   Collection Time: 02/05/20  2:29 PM  Specimen: Urine, Random  Result Value Ref Range Status   Specimen Description   Final    URINE, RANDOM Performed at [facility], [address] [place], [place] [address]    Special Requests   Final    NONE Performed at [facility], [address] [place], [place] [address]    Culture (A)  Final    >=100,000 COLONIES/mL ESCHERICHIA COLI Confirmed Extended Spectrum Beta-Lactamase Producer (ESBL).  In bloodstream infections from ESBL organisms, carbapenems are preferred over piperacillin/tazobactam. They are shown to have a lower risk of mortality.    Report Status 02/08/2020 FINAL  Final   Organism ID, Bacteria ESCHERICHIA COLI (A)  Final      Susceptibility   Escherichia coli - MIC*    AMPICILLIN >=32 RESISTANT Resistant     CEFAZOLIN >=64 RESISTANT Resistant     CEFTRIAXONE >=64  RESISTANT Resistant     CIPROFLOXACIN >=4 RESISTANT Resistant     GENTAMICIN <=1 SENSITIVE Sensitive     IMIPENEM <=0.25 SENSITIVE Sensitive     NITROFURANTOIN 128 RESISTANT Resistant     TRIMETH/SULFA >=320 RESISTANT Resistant     AMPICILLIN/SULBACTAM >=32 RESISTANT Resistant     PIP/TAZO <=4 SENSITIVE Sensitive     * >=100,000 COLONIES/mL ESCHERICHIA COLI  SARS Coronavirus 2 by RT PCR (hospital order, performed in [place] hospital lab) Nasopharyngeal Nasopharyngeal Swab     Status: None   Collection Time: 02/05/20  5:48 PM   Specimen: Nasopharyngeal Swab  Result Value Ref Range Status   SARS Coronavirus 2 NEGATIVE NEGATIVE Final    Comment: (NOTE) SARS-CoV-2 target nucleic acids are NOT DETECTED.  The SARS-CoV-2 RNA is generally detectable in upper and lower respiratory specimens during the acute phase of infection. The lowest concentration of SARS-CoV-2 viral copies this assay can detect is 250 copies / mL. A negative result does not preclude SARS-CoV-2 infection and should not be used as the sole basis for treatment or other patient management decisions.  A negative result may occur with improper specimen collection / handling, submission of specimen other than nasopharyngeal swab, presence of viral mutation(s) within the areas targeted by this assay, and inadequate number of viral copies (<250 copies / mL). A negative result must be combined with clinical observations, patient history, and epidemiological information.  Fact Sheet for Patients:   StrictlyIdeas.no  Fact Sheet for Healthcare Providers: BankingDealers.co.za  This test is not yet approved or  cleared by the [place] FDA and has been authorized for detection and/or diagnosis of SARS-CoV-2 by FDA under an Emergency Use Authorization (EUA).  This EUA will remain in effect (meaning this test can be used) for the duration of the COVID-19 declaration under  Section 564(b)(1) of the Act, 21 U.S.C. section 360bbb-3(b)(1), unless the authorization is terminated or revoked sooner.  Performed at [facility], [address] [place], [place] [address]      Labs: BNP (last 3 results) No results for input(s): BNP in the last 8760 hours. Basic Metabolic Panel: Recent Labs  Lab 02/05/20 1450 02/06/20 0430 02/08/20 0624  NA 143 143 141  K 3.9 3.8 4.4  CL 103 100 96*  CO2 33* 34* 33*  GLUCOSE 118* 97 102*  BUN 25* 21 19  CREATININE 0.83 0.66 0.65  CALCIUM 8.3* 8.2* 8.6*   Liver Function Tests: Recent Labs  Lab 02/06/20 0430  AST 17  ALT 12  ALKPHOS 69  BILITOT 0.6  PROT 5.5*  ALBUMIN 2.9*   No  results for input(s): LIPASE, AMYLASE in the last 168 hours. No results for input(s): AMMONIA in the last 168 hours. CBC: Recent Labs  Lab 02/05/20 1450 02/06/20 0430 02/08/20 0624 02/09/20 1542  WBC 4.3 5.1 4.7 5.0  NEUTROABS  --   --   --  2.5  HGB 13.9 12.1* 13.5 13.0  HCT 46.5 40.2 43.4 41.0  MCV 99.8 99.3 97.3 96.7  PLT 134* 115* 87* 121*   Cardiac Enzymes: No results for input(s): CKTOTAL, CKMB, CKMBINDEX, TROPONINI in the last 168 hours. BNP: Invalid input(s): POCBNP CBG: No results for input(s): GLUCAP in the last 168 hours. D-Dimer No results for input(s): DDIMER in the last 72 hours. Hgb A1c No results for input(s): HGBA1C in the last 72 hours. Lipid Profile No results for input(s): CHOL, HDL, LDLCALC, TRIG, CHOLHDL, LDLDIRECT in the last 72 hours. Thyroid function studies No results for input(s): TSH, T4TOTAL, T3FREE, THYROIDAB in the last 72 hours.  Invalid input(s): FREET3 Anemia work up No results for input(s): VITAMINB12, FOLATE, FERRITIN, TIBC, IRON, RETICCTPCT in the last 72 hours. Urinalysis    Component Value Date/Time   COLORURINE AMBER (A) 02/05/2020 1429   APPEARANCEUR CLOUDY (A) 02/05/2020 1429   APPEARANCEUR Cloudy (A) 01/16/2020 1125   LABSPEC 1.024 02/05/2020 1429   PHURINE 5.0  02/05/2020 1429   GLUCOSEU NEGATIVE 02/05/2020 1429   HGBUR SMALL (A) 02/05/2020 1429   BILIRUBINUR NEGATIVE 02/05/2020 1429   BILIRUBINUR Negative 01/16/2020 1125   KETONESUR NEGATIVE 02/05/2020 1429   PROTEINUR NEGATIVE 02/05/2020 1429   NITRITE POSITIVE (A) 02/05/2020 1429   LEUKOCYTESUR MODERATE (A) 02/05/2020 1429   Sepsis Labs Invalid input(s): PROCALCITONIN,  WBC,  LACTICIDVEN Microbiology Recent Results (from the past 240 hour(s))  Urine culture     Status: Abnormal   Collection Time: 02/05/20  2:29 PM   Specimen: Urine, Random  Result Value Ref Range Status   Specimen Description   Final    URINE, RANDOM Performed at [facility], [address], [place], [place] [address]    Special Requests   Final    NONE Performed at [facility], [address] [place], [place] [address]    Culture (A)  Final    >=100,000 COLONIES/mL ESCHERICHIA COLI Confirmed Extended Spectrum Beta-Lactamase Producer (ESBL).  In bloodstream infections from ESBL organisms, carbapenems are preferred over piperacillin/tazobactam. They are shown to have a lower risk of mortality.    Report Status 02/08/2020 FINAL  Final   Organism ID, Bacteria ESCHERICHIA COLI (A)  Final      Susceptibility   Escherichia coli - MIC*    AMPICILLIN >=32 RESISTANT Resistant     CEFAZOLIN >=64 RESISTANT Resistant     CEFTRIAXONE >=64 RESISTANT Resistant     CIPROFLOXACIN >=4 RESISTANT Resistant     GENTAMICIN <=1 SENSITIVE Sensitive     IMIPENEM <=0.25 SENSITIVE Sensitive     NITROFURANTOIN 128 RESISTANT Resistant     TRIMETH/SULFA >=320 RESISTANT Resistant     AMPICILLIN/SULBACTAM >=32 RESISTANT Resistant     PIP/TAZO <=4 SENSITIVE Sensitive     * >=100,000 COLONIES/mL ESCHERICHIA COLI  SARS Coronavirus 2 by RT PCR (hospital order, performed in [place] hospital lab) Nasopharyngeal Nasopharyngeal Swab     Status: None   Collection Time: 02/05/20  5:48 PM   Specimen: Nasopharyngeal Swab    Result Value Ref Range Status   SARS Coronavirus 2 NEGATIVE NEGATIVE Final    Comment: (NOTE) SARS-CoV-2 target nucleic acids are NOT DETECTED.  The  SARS-CoV-2 RNA is generally detectable in upper and lower respiratory specimens during the acute phase of infection. The lowest concentration of SARS-CoV-2 viral copies this assay can detect is 250 copies / mL. A negative result does not preclude SARS-CoV-2 infection and should not be used as the sole basis for treatment or other patient management decisions.  A negative result may occur with improper specimen collection / handling, submission of specimen other than nasopharyngeal swab, presence of viral mutation(s) within the areas targeted by this assay, and inadequate number of viral copies (<250 copies / mL). A negative result must be combined with clinical observations, patient history, and epidemiological information.  Fact Sheet for Patients:   StrictlyIdeas.no  Fact Sheet for Healthcare Providers: BankingDealers.co.za  This test is not yet approved or  cleared by the [place] FDA and has been authorized for detection and/or diagnosis of SARS-CoV-2 by FDA under an Emergency Use Authorization (EUA).  This EUA will remain in effect (meaning this test can be used) for the duration of the COVID-19 declaration under Section 564(b)(1) of the Act, 21 U.S.C. section 360bbb-3(b)(1), unless the authorization is terminated or revoked sooner.  Performed at [facility], [address], [place], [place] [address]      Time coordinating discharge: 35 minutes  SIGNED:   Cordelia Poche, MD [facility] 02/12/2020, 12:30 PM

## 2020-02-18 ENCOUNTER — Other Ambulatory Visit: Payer: Medicare Other | Admitting: Urology

## 2020-02-23 ENCOUNTER — Telehealth: Payer: Self-pay

## 2020-02-23 MED ORDER — DIAZEPAM 2 MG PO TABS
ORAL_TABLET | ORAL | 0 refills | Status: DC
Start: 2020-02-23 — End: 2020-02-27

## 2020-02-23 NOTE — Telephone Encounter (Signed)
Patients mother called triage because patient has an upcoming cysto appt scheduled for 8/4 and is having a lot of anxiety about procedure. Patients mother would like to know if there is anything you can send in for him before the procedure to calm him. Patients pharmacy verified - [facility] [place], [place].

## 2020-02-23 NOTE — Telephone Encounter (Signed)
Notified patient as instructed, patient pleased. Discussed follow-up appointments, patient agrees  

## 2020-02-23 NOTE — Telephone Encounter (Signed)
Rx sent to pharmacy.  He will need a driver.

## 2020-02-25 ENCOUNTER — Emergency Department: Payer: Medicare Other

## 2020-02-25 ENCOUNTER — Ambulatory Visit (INDEPENDENT_AMBULATORY_CARE_PROVIDER_SITE_OTHER): Payer: Medicare Other | Admitting: Urology

## 2020-02-25 ENCOUNTER — Encounter: Payer: Self-pay | Admitting: Urology

## 2020-02-25 ENCOUNTER — Other Ambulatory Visit: Payer: Self-pay

## 2020-02-25 ENCOUNTER — Inpatient Hospital Stay
Admission: EM | Admit: 2020-02-25 | Discharge: 2020-02-27 | DRG: 948 | Disposition: A | Discharge status: Home Health | Payer: Medicare Other | Attending: Hospitalist | Admitting: Hospitalist

## 2020-02-25 VITALS — BP 150/74 | HR 84 | Ht 72.0 in | Wt 290.0 lb

## 2020-02-25 DIAGNOSIS — R300 Dysuria: Secondary | ICD-10-CM | POA: Diagnosis not present

## 2020-02-25 DIAGNOSIS — N200 Calculus of kidney: Secondary | ICD-10-CM | POA: Diagnosis present

## 2020-02-25 DIAGNOSIS — Z96651 Presence of right artificial knee joint: Secondary | ICD-10-CM | POA: Diagnosis present

## 2020-02-25 DIAGNOSIS — Z1612 Extended spectrum beta lactamase (ESBL) resistance: Secondary | ICD-10-CM | POA: Diagnosis present

## 2020-02-25 DIAGNOSIS — F329 Major depressive disorder, single episode, unspecified: Secondary | ICD-10-CM | POA: Diagnosis present

## 2020-02-25 DIAGNOSIS — Z87891 Personal history of nicotine dependence: Secondary | ICD-10-CM

## 2020-02-25 DIAGNOSIS — F419 Anxiety disorder, unspecified: Secondary | ICD-10-CM | POA: Diagnosis present

## 2020-02-25 DIAGNOSIS — Z87442 Personal history of urinary calculi: Secondary | ICD-10-CM

## 2020-02-25 DIAGNOSIS — Z8619 Personal history of other infectious and parasitic diseases: Secondary | ICD-10-CM

## 2020-02-25 DIAGNOSIS — J189 Pneumonia, unspecified organism: Secondary | ICD-10-CM

## 2020-02-25 DIAGNOSIS — B9620 Unspecified Escherichia coli [E. coli] as the cause of diseases classified elsewhere: Secondary | ICD-10-CM | POA: Diagnosis present

## 2020-02-25 DIAGNOSIS — I5032 Chronic diastolic (congestive) heart failure: Secondary | ICD-10-CM | POA: Diagnosis present

## 2020-02-25 DIAGNOSIS — R932 Abnormal findings on diagnostic imaging of liver and biliary tract: Secondary | ICD-10-CM

## 2020-02-25 DIAGNOSIS — Z20822 Contact with and (suspected) exposure to covid-19: Secondary | ICD-10-CM | POA: Diagnosis present

## 2020-02-25 DIAGNOSIS — Z87440 Personal history of urinary (tract) infections: Secondary | ICD-10-CM

## 2020-02-25 DIAGNOSIS — I110 Hypertensive heart disease with heart failure: Secondary | ICD-10-CM | POA: Diagnosis present

## 2020-02-25 DIAGNOSIS — Z79899 Other long term (current) drug therapy: Secondary | ICD-10-CM

## 2020-02-25 DIAGNOSIS — R310 Gross hematuria: Secondary | ICD-10-CM

## 2020-02-25 DIAGNOSIS — N390 Urinary tract infection, site not specified: Secondary | ICD-10-CM

## 2020-02-25 DIAGNOSIS — Z7982 Long term (current) use of aspirin: Secondary | ICD-10-CM

## 2020-02-25 DIAGNOSIS — E119 Type 2 diabetes mellitus without complications: Secondary | ICD-10-CM

## 2020-02-25 DIAGNOSIS — R531 Weakness: Secondary | ICD-10-CM | POA: Diagnosis not present

## 2020-02-25 DIAGNOSIS — I10 Essential (primary) hypertension: Secondary | ICD-10-CM | POA: Diagnosis present

## 2020-02-25 DIAGNOSIS — Z1624 Resistance to multiple antibiotics: Secondary | ICD-10-CM | POA: Diagnosis present

## 2020-02-25 DIAGNOSIS — Z9884 Bariatric surgery status: Secondary | ICD-10-CM

## 2020-02-25 DIAGNOSIS — Z8249 Family history of ischemic heart disease and other diseases of the circulatory system: Secondary | ICD-10-CM

## 2020-02-25 DIAGNOSIS — K219 Gastro-esophageal reflux disease without esophagitis: Secondary | ICD-10-CM | POA: Diagnosis present

## 2020-02-25 DIAGNOSIS — K829 Disease of gallbladder, unspecified: Secondary | ICD-10-CM

## 2020-02-25 DIAGNOSIS — Z833 Family history of diabetes mellitus: Secondary | ICD-10-CM

## 2020-02-25 DIAGNOSIS — J9811 Atelectasis: Secondary | ICD-10-CM | POA: Diagnosis present

## 2020-02-25 DIAGNOSIS — N400 Enlarged prostate without lower urinary tract symptoms: Secondary | ICD-10-CM

## 2020-02-25 DIAGNOSIS — E860 Dehydration: Secondary | ICD-10-CM | POA: Diagnosis present

## 2020-02-25 LAB — TSH: TSH: 3.506 u[IU]/mL (ref 0.350–4.500)

## 2020-02-25 LAB — URINALYSIS, COMPLETE (UACMP) WITH MICROSCOPIC
Glucose, UA: NEGATIVE mg/dL
Ketones, ur: NEGATIVE mg/dL
Nitrite: NEGATIVE
Protein, ur: NEGATIVE mg/dL
Specific Gravity, Urine: 1.028 (ref 1.005–1.030)
WBC, UA: >50 WBC/hpf — ABNORMAL HIGH (ref 0–5)
pH: 5 (ref 5.0–8.0)

## 2020-02-25 LAB — CBC WITH DIFFERENTIAL/PLATELET
Abs Immature Granulocytes: 0.04 10*3/uL (ref 0.00–0.07)
Basophils Absolute: 0.1 10*3/uL (ref 0.0–0.1)
Basophils Relative: 1 %
Eosinophils Absolute: 0.4 10*3/uL (ref 0.0–0.5)
Eosinophils Relative: 4 %
HCT: 45.1 % (ref 39.0–52.0)
Hemoglobin: 14 g/dL (ref 13.0–17.0)
Immature Granulocytes: 0 %
Lymphocytes Relative: 18 %
Lymphs Abs: 2 10*3/uL (ref 0.7–4.0)
MCH: 30.4 pg (ref 26.0–34.0)
MCHC: 31 g/dL (ref 30.0–36.0)
MCV: 98 fL (ref 80.0–100.0)
Monocytes Absolute: 0.9 10*3/uL (ref 0.1–1.0)
Monocytes Relative: 8 %
Neutro Abs: 7.6 10*3/uL (ref 1.7–7.7)
Neutrophils Relative %: 69 %
Platelets: 190 10*3/uL (ref 150–400)
RBC: 4.6 MIL/uL (ref 4.22–5.81)
RDW: 14.1 % (ref 11.5–15.5)
WBC: 10.9 10*3/uL — ABNORMAL HIGH (ref 4.0–10.5)
nRBC: 0 % (ref 0.0–0.2)

## 2020-02-25 LAB — LACTIC ACID, PLASMA
Lactic Acid, Venous: 1.3 mmol/L (ref 0.5–1.9)
Lactic Acid, Venous: 1 mmol/L (ref 0.5–1.9)

## 2020-02-25 LAB — COMPREHENSIVE METABOLIC PANEL
ALT: 11 U/L (ref 0–44)
AST: 13 U/L — ABNORMAL LOW (ref 15–41)
Albumin: 2.9 g/dL — ABNORMAL LOW (ref 3.5–5.0)
Alkaline Phosphatase: 69 U/L (ref 38–126)
Anion gap: 8 (ref 5–15)
BUN: 28 mg/dL — ABNORMAL HIGH (ref 8–23)
CO2: 33 mmol/L — ABNORMAL HIGH (ref 22–32)
Calcium: 8.4 mg/dL — ABNORMAL LOW (ref 8.9–10.3)
Chloride: 99 mmol/L (ref 98–111)
Creatinine, Ser: 0.76 mg/dL (ref 0.61–1.24)
GFR calc Af Amer: >60 mL/min (ref >60)
GFR calc non Af Amer: >60 mL/min (ref >60)
Glucose, Bld: 85 mg/dL (ref 70–99)
Potassium: 3.9 mmol/L (ref 3.5–5.1)
Sodium: 140 mmol/L (ref 135–145)
Total Bilirubin: 0.7 mg/dL (ref 0.3–1.2)
Total Protein: 6.3 g/dL — ABNORMAL LOW (ref 6.5–8.1)

## 2020-02-25 LAB — MAGNESIUM: Magnesium: 1.9 mg/dL (ref 1.7–2.4)

## 2020-02-25 LAB — TROPONIN I (HIGH SENSITIVITY)
Troponin I (High Sensitivity): 21 ng/L — ABNORMAL HIGH (ref <18)
Troponin I (High Sensitivity): 18 ng/L — ABNORMAL HIGH (ref <18)

## 2020-02-25 LAB — SARS CORONAVIRUS 2 BY RT PCR (HOSPITAL ORDER, PERFORMED IN ~~LOC~~ HOSPITAL LAB): SARS Coronavirus 2: NEGATIVE

## 2020-02-25 MED ORDER — SODIUM CHLORIDE 0.9 % IV SOLN: INTRAVENOUS | Status: DC

## 2020-02-25 MED ORDER — INSULIN ASPART 100 UNIT/ML ~~LOC~~ SOLN: 0.0000 [IU] | Freq: Every day | SUBCUTANEOUS | Status: DC

## 2020-02-25 MED ORDER — SODIUM CHLORIDE 0.9 % IV SOLN
500.0000 mg | Freq: Once | INTRAVENOUS | Status: AC
  Administered 2020-02-25: 500 mg via INTRAVENOUS
  Filled 2020-02-25: qty 500

## 2020-02-25 MED ORDER — LACTATED RINGERS IV BOLUS
500.0000 mL | Freq: Once | INTRAVENOUS | Status: AC
  Administered 2020-02-25: 500 mL via INTRAVENOUS

## 2020-02-25 MED ORDER — SODIUM CHLORIDE 0.9% FLUSH
3.0000 mL | Freq: Once | INTRAVENOUS | Status: AC
  Administered 2020-02-25: 3 mL via INTRAVENOUS

## 2020-02-25 MED ORDER — SODIUM CHLORIDE 0.9 % IV SOLN
500.0000 mg | INTRAVENOUS | Status: DC
  Filled 2020-02-25: qty 500

## 2020-02-25 MED ORDER — SODIUM CHLORIDE 0.9 % IV SOLN
2.0000 g | INTRAVENOUS | Status: DC
  Filled 2020-02-25: qty 20

## 2020-02-25 MED ORDER — IOHEXOL 300 MG/ML  SOLN
100.0000 mL | Freq: Once | INTRAMUSCULAR | Status: AC | PRN
  Administered 2020-02-25: 100 mL via INTRAVENOUS

## 2020-02-25 MED ORDER — ENOXAPARIN SODIUM 40 MG/0.4ML ~~LOC~~ SOLN
40.0000 mg | SUBCUTANEOUS | Status: DC
  Administered 2020-02-26 – 2020-02-27 (×2): 40 mg via SUBCUTANEOUS
  Filled 2020-02-25 (×2): qty 0.4

## 2020-02-25 MED ORDER — INSULIN ASPART 100 UNIT/ML ~~LOC~~ SOLN: 0.0000 [IU] | Freq: Three times a day (TID) | SUBCUTANEOUS | Status: DC

## 2020-02-25 MED ORDER — SODIUM CHLORIDE 0.9 % IV SOLN
1.0000 g | Freq: Once | INTRAVENOUS | Status: AC
  Administered 2020-02-25: 1 g via INTRAVENOUS
  Filled 2020-02-25: qty 10

## 2020-02-25 NOTE — ED Provider Notes (Signed)
[facility] Emergency Department Provider Note  ____________________________________________   First MD Initiated Contact with Patient 02/25/20 1751     (approximate)  I have reviewed the triage vital signs and the nursing notes.   HISTORY  Chief Complaint Urinary Tract Infection   HPI Curtis Alexander is a 74 y.o. male with medical history significant ofessential hypertension, diabetes without complication, GERD, essential tremor,h/o renal stones,recurrent urinary tract infections who presents for assessment of 4 to 5 days of global weakness and fatigue.  Patient denies any focal pain specifically any headache, chest pain, abdominal pain, back pain, genital pain, or extremity pain.  He denies any fevers, chills, cough, vomiting, diarrhea, dysuria, blood in stool, blood in his urine, burning with urination, or any other acute symptoms.  States he feels too weak to get out of bed and that he is typically able to ambulate with a walker using wheelchair but currently is too weak to do so.  States this is how he felt when he last had a urinary tract infection and states he did not have any symptoms at that time either.  No clear alleviating aggravating factors.  States this feels very similar to prior episode.         Past Medical History:  Diagnosis Date  . Arthritis   . Cervical spinal stenosis   . Diabetes mellitus without complication ([place])   . Essential tremor   . GERD (gastroesophageal reflux disease)   . History of hiatal hernia    fixed with gastric bypass surgery  . History of kidney stones   . Hypertension   . Hypoglycemia   . Non-traumatic rupture of right patellar tendon 03/25/2019  . Sleep apnea    had gastric bypass and no longer uses cpap machine  . Spinal stenosis, lumbar     Patient Active Problem List   Diagnosis Date Noted  . Generalized weakness 02/25/2020  . RLL pneumonia 02/25/2020  . Recurrent UTI 02/25/2020  . Abnormal  findings on diagnostic imaging of gallbladder 02/25/2020  . BPH (benign prostatic hyperplasia) 02/25/2020  . Hyperlipidemia 02/12/2020  . Chronic diastolic CHF (congestive heart failure) ([place]) 02/12/2020  . Urinary tract infection due to extended-spectrum beta lactamase (ESBL) producing Escherichia coli 02/05/2020  . Urinary tract infection 01/09/2020  . Non-traumatic rupture of right patellar tendon 03/25/2019  . 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 Essential tremor 03/04/2019  . Gout 03/04/2019  . Intestinal malabsorption 09/29/2018  . 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 Lumbar herniated disc 12/17/2012  . Nephrolithiasis 06/11/2012  . Anxiety disorder 03/12/2012  . Status post abdominoplasty 04/30/2007  . Absence of bladder continence 11/04/2003  . Back pain 11/04/2003  . Depression 11/04/2003  . Diabetes mellitus, type II ([place]) 11/04/2003  . Hypertension 11/04/2003  . Insomnia 11/04/2003  . Obesity, Class III, BMI 40-49.9 (morbid obesity) ([place]) 11/04/2003  . Orthopnea 11/04/2003  . Peptic ulcer disease 11/04/2003  . Sleep apnea 11/04/2003  . Venous insufficiency 11/04/2003    Past Surgical History:  Procedure Laterality Date  . FRACTURE SURGERY Left    age 6  . GASTRIC BYPASS     fixed hiatal hernia  . KNEE ARTHROPLASTY Right 03/05/2019   Procedure: COMPUTER ASSISTED TOTAL KNEE ARTHROPLASTY;  Surgeon: Dereck Leep, MD;  Location: [facility] ORS;  Service: Orthopedics;  Laterality: Right;  . PATELLAR TENDON REPAIR Right 03/28/2019   Procedure: PATELLA TENDON REPAIR;  Surgeon: Dereck Leep, MD;  Location: [facility] ORS;  Service: Orthopedics;  Laterality: Right;  . SKIN SURGERY     after gastric bypass and pt became septic and had wound vac placed  . VENA CAVA FILTER PLACEMENT     WAS PLACED DURING PTS GASTRIC BYPASS SURGERY PER PT    Prior to Admission  medications   Medication Sig Start Date End Date Taking? Authorizing Provider  allopurinol (ZYLOPRIM) 300 MG tablet Take 300 mg by mouth daily with lunch.    [provider]  aspirin EC 81 MG tablet Take 81 mg by mouth daily. Swallow whole.    [provider]  brimonidine (ALPHAGAN) 0.2 % ophthalmic solution Place 1 drop into the right eye 2 (two) times daily.    [provider]  Cholecalciferol (VITAMIN D) 50 MCG (2000 UT) CAPS Take 2,000 Units by mouth daily.    [provider]  clonazePAM (KLONOPIN) 0.5 MG tablet Take 0.25 in the morning, 0.25 at lunch, 0.5 mg at bedtime 02/12/20   Mariel Aloe, MD  Cranberry 250 MG TABS Take 250 mg by mouth daily.    [provider]  Cyanocobalamin (B-12) 2500 MCG TABS Take 2,500 mcg by mouth daily.     [provider]  cyclobenzaprine (FLEXERIL) 10 MG tablet Take 10 mg by mouth 2 (two) times daily.    [provider]  diazepam (VALIUM) 2 MG tablet 1 tab po 30 min prior to procedure 02/23/20   Stoioff, Ronda Fairly, MD  ferrous sulfate 325 (65 FE) MG tablet Take 1 tablet (325 mg total) by mouth 2 (two) times daily with a meal. Patient taking differently: Take 325 mg by mouth every other day.  03/09/19   [name], PA-C  finasteride (PROSCAR) 5 MG tablet Take 5 mg by mouth daily with lunch.    [provider]  furosemide (LASIX) 20 MG tablet Take 1 tablet (20 mg total) by mouth daily. 01/11/20 01/10/21  Rai, Vernelle Emerald, MD  gabapentin (NEURONTIN) 300 MG capsule Take 300 mg by mouth 2 (two) times daily. 12/29/19   [provider]  latanoprost (XALATAN) 0.005 % ophthalmic solution Place 1 drop into the right eye at bedtime.    [provider]  loratadine (CLARITIN) 10 MG tablet Take 10 mg by mouth daily as needed for allergies.    [provider]  mirtazapine (REMERON) 45 MG tablet Take 45 mg by mouth at bedtime.    [provider]  nystatin (MYCOSTATIN/NYSTOP)  powder Apply topically 2 (two) times daily. Apply to abdominal folds /chest wall crease 01/11/20   Rai, Ripudeep K, MD  omeprazole (PRILOSEC) 40 MG capsule Take 40 mg by mouth daily with lunch.    [provider]  primidone (MYSOLINE) 50 MG tablet Take 50-75 mg by mouth See admin instructions. Take 1 tablets (75mg ) by mouth every morning and take 1 tablet (50mg ) by mouth every afternoon    [provider]  propranolol (INDERAL) 40 MG tablet Take 40 mg by mouth 2 (two) times daily.     [provider]  simvastatin (ZOCOR) 20 MG tablet Take 1 tablet by mouth every evening.    [provider]  tamsulosin (FLOMAX) 0.4 MG CAPS capsule Take 0.4 mg by mouth at bedtime.    [provider]  zolpidem (AMBIEN) 5 MG tablet Take 1 tablet (5 mg total) by mouth at bedtime. 02/12/20   Mariel Aloe, MD    Allergies Patient has  no known allergies.  History reviewed. No pertinent family history.  Social History Social History   Tobacco Use  . Smoking status: Never Smoker  . Smokeless tobacco: Never Used  Vaping Use  . Vaping Use: Never used  Substance Use Topics  . Alcohol use: Yes    Comment: occ 1 glass per week  . Drug use: Never    Review of Systems  Review of Systems  Constitutional: Positive for malaise/fatigue. Negative for chills and fever.  HENT: Negative for sore throat.   Eyes: Negative for pain.  Respiratory: Negative for cough and stridor.   Cardiovascular: Negative for chest pain.  Gastrointestinal: Negative for vomiting.  Skin: Negative for rash.  Neurological: Negative for seizures, loss of consciousness and headaches.  Psychiatric/Behavioral: Negative for suicidal ideas.  All other systems reviewed and are negative.     ____________________________________________   PHYSICAL EXAM:  VITAL SIGNS: ED Triage Vitals [02/25/20 1536]  Enc Vitals Group     BP 124/66     Pulse Rate 64     Resp 18     Temp 98.9 F (37.2 C)      Temp Source Oral     SpO2 93 %     Weight 288 lb 12.8 oz (131 kg)     Height 6' (1.829 m)     Head Circumference      Peak Flow      Pain Score 0     Pain Loc      Pain Edu?      Excl. in [place]?    Vitals:   02/25/20 1536 02/25/20 1723  BP: 124/66 100/75  Pulse: 64 68  Resp: 18 16  Temp: 98.9 F (37.2 C) 97.7 F (36.5 C)  SpO2: 93% 92%   Physical Exam Vitals and nursing note reviewed.  Constitutional:      Appearance: He is well-developed. He is obese. He is ill-appearing.  HENT:     Head: Normocephalic and atraumatic.     Right Ear: External ear normal.     Left Ear: External ear normal.     Nose: Nose normal.     Mouth/Throat:     Mouth: Mucous membranes are moist.  Eyes:     Conjunctiva/sclera: Conjunctivae normal.  Cardiovascular:     Rate and Rhythm: Normal rate and regular rhythm.     Pulses: Normal pulses.     Heart sounds: No murmur heard.   Pulmonary:     Effort: Pulmonary effort is normal. No respiratory distress.     Breath sounds: Normal breath sounds.  Abdominal:     Palpations: Abdomen is soft.     Tenderness: There is no abdominal tenderness.  Musculoskeletal:     Cervical back: Neck supple. No rigidity.  Skin:    General: Skin is warm and dry.     Capillary Refill: Capillary refill takes less than 2 seconds.  Neurological:     General: No focal deficit present.     Mental Status: He is alert and oriented to person, place, and time.  Psychiatric:        Mood and Affect: Mood normal.     Patient has no focal deficits on exam although is too weak to move from chair to bed on his own and required assistance of myself and bedside nurse. ____________________________________________   LABS (all labs ordered are listed, but only abnormal results are displayed)  Labs Reviewed  COMPREHENSIVE METABOLIC PANEL - Abnormal; Notable for the following components:  Result Value   CO2 33 (*)    BUN 28 (*)    Calcium 8.4 (*)    Total Protein 6.3 (*)     Albumin 2.9 (*)    AST 13 (*)    All other components within normal limits  CBC WITH DIFFERENTIAL/PLATELET - Abnormal; Notable for the following components:   WBC 10.9 (*)    All other components within normal limits  URINALYSIS, COMPLETE (UACMP) WITH MICROSCOPIC - Abnormal; Notable for the following components:   Color, Urine AMBER (*)    APPearance CLOUDY (*)    Hgb urine dipstick SMALL (*)    Bilirubin Urine SMALL (*)    Leukocytes,Ua SMALL (*)    WBC, UA >50 (*)    Bacteria, UA MANY (*)    All other components within normal limits  TROPONIN I (HIGH SENSITIVITY) - Abnormal; Notable for the following components:   Troponin I (High Sensitivity) 21 (*)    All other components within normal limits  SARS CORONAVIRUS 2 BY RT PCR (HOSPITAL ORDER, [place])  CULTURE, BLOOD (ROUTINE X 2)  CULTURE, BLOOD (ROUTINE X 2)  LACTIC ACID, PLASMA  TSH  MAGNESIUM  LACTIC ACID, PLASMA  TROPONIN I (HIGH SENSITIVITY)   ____________________________________________  EKG  Sinus rhythm with a ventricular rate of 70, right bundle branch block, left anterior fascicle block, and T wave changes in the anterior leads that are unchanged when compared to prior without other evidence of acute ischemia or other significant arrhythmia. ____________________________________________  RADIOLOGY   Official radiology report(s): DG Chest 2 View  Result Date: 02/25/2020 CLINICAL DATA:  Fatigue. EXAM: CHEST - 2 VIEW COMPARISON:  February 05, 2020. FINDINGS: Stable cardiomediastinal silhouette. No pneumothorax or pleural effusion is noted. Left lung is clear. Mild right basilar atelectasis or infiltrate is noted. Bony thorax is unremarkable. IMPRESSION: Mild right basilar subsegmental atelectasis or infiltrate. Electronically Signed   By: Marijo Conception M.D.   On: 02/25/2020 16:10   CT ABDOMEN PELVIS W CONTRAST  Result Date: 02/25/2020 CLINICAL DATA:  Global weakness and fatigue peer  concern for urinary tract infection. EXAM: CT ABDOMEN AND PELVIS WITH CONTRAST TECHNIQUE: Multidetector CT imaging of the abdomen and pelvis was performed using the standard protocol following bolus administration of intravenous contrast. CONTRAST:  171mL OMNIPAQUE IOHEXOL 300 MG/ML  SOLN COMPARISON:  CT dated February 09, 2020 FINDINGS: Lower chest: There is a new airspace opacity at the right lung base concerning for pneumonia.The heart size is mildly enlarged. The main pulmonary artery is dilated measuring approximately 4.2 cm. There are coronary artery calcifications. Hepatobiliary: There is a stable cyst within the right hepatic lobe. There is mild fat stranding about the gallbladder. There is a new small focus of gas within the gallbladder (axial series 2, image 31).There is no biliary ductal dilation. Pancreas: Normal contours without ductal dilatation. No peripancreatic fluid collection. Spleen: Unremarkable. Adrenals/Urinary Tract: --Adrenal glands: There is a stable right adrenal adenoma. --Right kidney/ureter: Again noted is a large nonobstructing stone in the right kidney measuring approximately 3.2 cm. There is no right-sided hydronephrosis. --Left kidney/ureter: No hydronephrosis or radiopaque kidney stones. 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 The bladder is otherwise unremarkable. Stomach/Bowel: --Stomach/Duodenum: The patient is status post prior gastric bypass. --Small bowel: Unremarkable. --Colon: There is a large amount of stool in the colon. --Appendix: Normal. Vascular/Lymphatic: Atherosclerotic calcification is present within the non-aneurysmal abdominal aorta, without hemodynamically significant stenosis. There is  a well-positioned non retrievable IVC filter in place. --No retroperitoneal lymphadenopathy. --No mesenteric lymphadenopathy. --No pelvic or inguinal lymphadenopathy. Reproductive: Unremarkable Other: There is a small amount of free fluid  in the patient's pelvis. There is no free air. Musculoskeletal. Advanced multilevel degenerative changes are noted throughout the visualized thoracolumbar spine. IMPRESSION: 1. New airspace opacity at the right lung base concerning for pneumonia. 2. There is mild fat stranding about the gallbladder. There is a new small focus of gas within the gallbladder. This could be secondary to recent instrumentation or infection. Other differential considerations include peptic ulcer disease correlation with laboratory studies is recommended. If there is clinical concern for acute cholecystitis, follow-up with ultrasound is recommended. 3. Large nonobstructing stone in the right kidney, similar to prior study. 4. Stable right adrenal adenoma. 5. Large amount of stool in the colon. Aortic Atherosclerosis (ICD10-I70.0). Electronically Signed   By: Constance Holster M.D.   On: 02/25/2020 19:54    ____________________________________________   PROCEDURES  Procedure(s) performed (including Critical Care):  Procedures   ____________________________________________   INITIAL IMPRESSION / ASSESSMENT AND PLAN / ED COURSE        Overall unclear allergy for patient's global fatigue and general lysed weakness.  Patient is afebrile and hemodynamically stable on arrival.  However on exam he was noted to be too weak to move from chair to bed.  ED work-up does show concerning findings for right lower lobe pneumonia as well as possible UTI although given he does have stones is not entirely unexpected for him to have significant white blood cells in his urine.  No evidence of pyelonephritis, diverticulitis, appendicitis, or common bile duct dilation on CT abdomen pelvis.  Her gallbladder is noted to be enlarged clinically patient's presentation is not consistent with acute cholecystitis and has no elevation of his LFTs or tenderness in his right upper quadrant to suggest this.  No other significant electrolyte derangements  noted on labs although his troponin is noted to be mildly elevated which is likely secondary to mild demand ischemia in the setting of some mild dehydration.  Patient given below noted antibiotics as well as IV fluid hydration.  Given significant weakness likely secondary to pneumonia versus UTI we will plan to admit to medicine service for evaluation management.     Medications  sodium chloride flush (NS) 0.9 % injection 3 mL (has no administration in time range)  lactated ringers bolus 500 mL (has no administration in time range)  azithromycin (ZITHROMAX) 500 mg in sodium chloride 0.9 % 250 mL IVPB (has no administration in time range)  cefTRIAXone (ROCEPHIN) 1 g in sodium chloride 0.9 % 100 mL IVPB (1 g Intravenous New Bag/Given 02/25/20 1919)  iohexol (OMNIPAQUE) 300 MG/ML solution 100 mL (100 mLs Intravenous Contrast Given 02/25/20 1930)    ____________________________________________   FINAL CLINICAL IMPRESSION(S) / ED DIAGNOSES  Final diagnoses:  Gall bladder disease  Dysuria  Dehydration  Pneumonia of right lower lobe due to infectious organism     ED Discharge Orders    None       Note:  This document was prepared using Dragon voice recognition software and may include unintentional dictation errors.   Lucrezia Starch, MD 02/25/20 2025

## 2020-02-25 NOTE — ED Notes (Signed)
Dr. Duncan at bedside 

## 2020-02-25 NOTE — ED Triage Notes (Signed)
Pt reports weakness and fatigue after UTI symptoms continuing. Pt was d/c a few weeks ago for same. Pt reports burning with urination and fatigue. Denies other symptoms.

## 2020-02-25 NOTE — H&P (Signed)
History and Physical    Curtis Alexander IEP:[id number] DOB: [birth year] DOA: 02/25/2020  PCP: [name]   Patient coming from: home  I have personally briefly reviewed patient's old medical records in [place]  Chief Complaint: Generalized weakness  HPI: Curtis Alexander is a 74 y.o. male with medical history significant for diabetes, HTN, recurrent UTIs with ESBL UTI in 02/2019 and again 01/2020, as well as nephrolithiasis and BPH , who presents to the emergency room with generalized weakness that started several days ago with no other notable symptoms.  Patient was recently hospitalized from 7/15-7/22 with ESBL E. coli, treated with meropenem and transition to Zosyn completing 7 days of antibiotics prior to discharged with urology follow-up to evaluate for etiology of recurrent UTI.  Patient reports protracted weakness and inability to care for self at home.  Denies fever, chills, abdominal pain or dysuria.  Denies nausea vomiting or diarrhea ED Course: On arrival vitals within normal limits.  WBC 10.9, lactic acid normal.  Urinalysis with small leukocyte esterase.  CT abdomen and pelvis showed possible RLL infiltrate, mild fat stranding about the gallbladder, large nonobstructing stone in the right kidney similar to prior study.  Patient started on Rocephin and azithromycin.  Hospitalist consulted for admission.  Review of Systems: As per HPI otherwise all other systems on review of systems negative.    Past Medical History:  Diagnosis Date  . Arthritis   . Cervical spinal stenosis   . Diabetes mellitus without complication ([place])   . Essential tremor   . GERD (gastroesophageal reflux disease)   . History of hiatal hernia    fixed with gastric bypass surgery  . History of kidney stones   . Hypertension   . Hypoglycemia   . Non-traumatic rupture of right patellar tendon 03/25/2019  . Sleep apnea    had gastric bypass and no longer uses cpap machine  . Spinal stenosis, lumbar      Past Surgical History:  Procedure Laterality Date  . FRACTURE SURGERY Left    age 58  . GASTRIC BYPASS     fixed hiatal hernia  . KNEE ARTHROPLASTY Right 03/05/2019   Procedure: COMPUTER ASSISTED TOTAL KNEE ARTHROPLASTY;  Surgeon: Dereck Leep, MD;  Location: [facility] ORS;  Service: Orthopedics;  Laterality: Right;  . PATELLAR TENDON REPAIR Right 03/28/2019   Procedure: PATELLA TENDON REPAIR;  Surgeon: Dereck Leep, MD;  Location: [facility] ORS;  Service: Orthopedics;  Laterality: Right;  . SKIN SURGERY     after gastric bypass and pt became septic and had wound vac placed  . VENA CAVA FILTER PLACEMENT     WAS PLACED DURING PTS GASTRIC BYPASS SURGERY PER PT     reports that he has never smoked. He has never used smokeless tobacco. He reports current alcohol use. He reports that he does not use drugs.  No Known Allergies  History reviewed. No pertinent family history.    Prior to Admission medications   Medication Sig Start Date End Date Taking? Authorizing Provider  zolpidem (AMBIEN) 5 MG tablet Take 1 tablet (5 mg total) by mouth at bedtime. 02/12/20  Yes Mariel Aloe, MD  allopurinol (ZYLOPRIM) 300 MG tablet Take 300 mg by mouth daily with lunch.    [provider]  aspirin EC 81 MG tablet Take 81 mg by mouth daily. Swallow whole.    [provider]  brimonidine (ALPHAGAN) 0.2 % ophthalmic solution Place 1 drop into the right eye 2 (two) times daily.  [provider]  Cholecalciferol (VITAMIN D) 50 MCG (2000 UT) CAPS Take 2,000 Units by mouth daily.    [provider]  clonazePAM (KLONOPIN) 0.5 MG tablet Take 0.25 in the morning, 0.25 at lunch, 0.5 mg at bedtime 02/12/20   Mariel Aloe, MD  Cranberry 250 MG TABS Take 250 mg by mouth daily.    [provider]  Cyanocobalamin (B-12) 2500 MCG TABS Take 2,500 mcg by mouth daily.     [provider]  cyclobenzaprine (FLEXERIL) 10 MG tablet Take 10 mg by mouth 2 (two) times  daily.    [provider]  diazepam (VALIUM) 2 MG tablet 1 tab po 30 min prior to procedure Patient not taking: Reported on 02/25/2020 02/23/20   Abbie Sons, MD  ferrous sulfate 325 (65 FE) MG tablet Take 1 tablet (325 mg total) by mouth 2 (two) times daily with a meal. Patient taking differently: Take 325 mg by mouth every other day.  03/09/19   [name], PA-C  finasteride (PROSCAR) 5 MG tablet Take 5 mg by mouth daily with lunch.    [provider]  furosemide (LASIX) 20 MG tablet Take 1 tablet (20 mg total) by mouth daily. 01/11/20 01/10/21  Rai, Vernelle Emerald, MD  gabapentin (NEURONTIN) 300 MG capsule Take 300 mg by mouth 2 (two) times daily. 12/29/19   [provider]  latanoprost (XALATAN) 0.005 % ophthalmic solution Place 1 drop into the right eye at bedtime.    [provider]  loratadine (CLARITIN) 10 MG tablet Take 10 mg by mouth daily as needed for allergies.    [provider]  mirtazapine (REMERON) 45 MG tablet Take 45 mg by mouth at bedtime.    [provider]  nystatin (MYCOSTATIN/NYSTOP) powder Apply topically 2 (two) times daily. Apply to abdominal folds /chest wall crease 01/11/20   Rai, Ripudeep K, MD  omeprazole (PRILOSEC) 40 MG capsule Take 40 mg by mouth daily with lunch.    [provider]  primidone (MYSOLINE) 50 MG tablet Take 50-75 mg by mouth See admin instructions. Take 1 tablets (75mg ) by mouth every morning and take 1 tablet (50mg ) by mouth every afternoon    [provider]  propranolol (INDERAL) 40 MG tablet Take 40 mg by mouth 2 (two) times daily.     [provider]  simvastatin (ZOCOR) 20 MG tablet Take 1 tablet by mouth every evening.    [provider]  tamsulosin (FLOMAX) 0.4 MG CAPS capsule Take 0.4 mg by mouth at bedtime.    [provider]    Physical Exam: Vitals:   02/25/20 1536 02/25/20 1723  BP: 124/66 100/75  Pulse: 64 68  Resp: 18 16  Temp: 98.9 F  (37.2 C) 97.7 F (36.5 C)  TempSrc: Oral Oral  SpO2: 93% 92%  Weight: 131 kg   Height: 6' (1.829 m)      Vitals:   02/25/20 1536 02/25/20 1723  BP: 124/66 100/75  Pulse: 64 68  Resp: 18 16  Temp: 98.9 F (37.2 C) 97.7 F (36.5 C)  TempSrc: Oral Oral  SpO2: 93% 92%  Weight: 131 kg   Height: 6' (1.829 m)       Constitutional:  Appears chronically ill but no acute distress HEENT:      Head: Normocephalic and atraumatic.         Eyes: PERLA, EOMI, Conjunctivae are normal. Sclera is non-icteric.       Mouth/Throat: Mucous membranes are  moist.       Neck: Supple with no signs of meningismus. Cardiovascular: Regular rate and rhythm. No murmurs, gallops, or rubs. 2+ symmetrical distal pulses are present . No JVD. No LE edema Respiratory: Respiratory effort normal .Lungs sounds clear bilaterally. No wheezes, crackles, or rhonchi.  Gastrointestinal: Soft, non tender, and non distended with positive bowel sounds. No rebound or guarding. Genitourinary: No CVA tenderness. Musculoskeletal: Nontender with normal range of motion in all extremities. No cyanosis, or erythema of extremities. Neurologic:. Face is symmetric. Moving all extremities. No gross focal neurologic deficits . Skin: Skin is warm, dry.  No rash or ulcers Psychiatric: Mood and affect are normal  Labs on Admission: I have personally reviewed following labs and imaging studies  CBC: Recent Labs  Lab 02/25/20 1541  WBC 10.9*  NEUTROABS 7.6  HGB 14.0  HCT 45.1  MCV 98.0  PLT 329   Basic Metabolic Panel: Recent Labs  Lab 02/25/20 1541  NA 140  K 3.9  CL 99  CO2 33*  GLUCOSE 85  BUN 28*  CREATININE 0.76  CALCIUM 8.4*  MG 1.9   GFR: Estimated Creatinine Clearance: 110 mL/min (by C-G formula based on SCr of 0.76 mg/dL). Liver Function Tests: Recent Labs  Lab 02/25/20 1541  AST 13*  ALT 11  ALKPHOS 69  BILITOT 0.7  PROT 6.3*  ALBUMIN 2.9*   No results for input(s): LIPASE, AMYLASE in the last  168 hours. No results for input(s): AMMONIA in the last 168 hours. Coagulation Profile: No results for input(s): INR, PROTIME in the last 168 hours. Cardiac Enzymes: No results for input(s): CKTOTAL, CKMB, CKMBINDEX, TROPONINI in the last 168 hours. BNP (last 3 results) No results for input(s): PROBNP in the last 8760 hours. HbA1C: No results for input(s): HGBA1C in the last 72 hours. CBG: No results for input(s): GLUCAP in the last 168 hours. Lipid Profile: No results for input(s): CHOL, HDL, LDLCALC, TRIG, CHOLHDL, LDLDIRECT in the last 72 hours. Thyroid Function Tests: Recent Labs    02/25/20 1541  TSH 3.506   Anemia Panel: No results for input(s): VITAMINB12, FOLATE, FERRITIN, TIBC, IRON, RETICCTPCT in the last 72 hours. Urine analysis:    Component Value Date/Time   COLORURINE AMBER (A) 02/25/2020 1823   APPEARANCEUR CLOUDY (A) 02/25/2020 1823   APPEARANCEUR Cloudy (A) 01/16/2020 1125   LABSPEC 1.028 02/25/2020 1823   PHURINE 5.0 02/25/2020 1823   GLUCOSEU NEGATIVE 02/25/2020 1823   HGBUR SMALL (A) 02/25/2020 1823   BILIRUBINUR SMALL (A) 02/25/2020 1823   BILIRUBINUR Negative 01/16/2020 [place] 02/25/2020 1823   PROTEINUR NEGATIVE 02/25/2020 1823   NITRITE NEGATIVE 02/25/2020 1823   LEUKOCYTESUR SMALL (A) 02/25/2020 1823    Radiological Exams on Admission: DG Chest 2 View  Result Date: 02/25/2020 CLINICAL DATA:  Fatigue. EXAM: CHEST - 2 VIEW COMPARISON:  February 05, 2020. FINDINGS: Stable cardiomediastinal silhouette. No pneumothorax or pleural effusion is noted. Left lung is clear. Mild right basilar atelectasis or infiltrate is noted. Bony thorax is unremarkable. IMPRESSION: Mild right basilar subsegmental atelectasis or infiltrate. Electronically Signed   By: Marijo Conception M.D.   On: 02/25/2020 16:10   CT ABDOMEN PELVIS W CONTRAST  Result Date: 02/25/2020 CLINICAL DATA:  Global weakness and fatigue peer concern for urinary tract infection. EXAM:  CT ABDOMEN AND PELVIS WITH CONTRAST TECHNIQUE: Multidetector CT imaging of the abdomen and pelvis was performed using the standard protocol following bolus administration of intravenous contrast. CONTRAST:  193mL OMNIPAQUE  IOHEXOL 300 MG/ML  SOLN COMPARISON:  CT dated February 09, 2020 FINDINGS: Lower chest: There is a new airspace opacity at the right lung base concerning for pneumonia.The heart size is mildly enlarged. The main pulmonary artery is dilated measuring approximately 4.2 cm. There are coronary artery calcifications. Hepatobiliary: There is a stable cyst within the right hepatic lobe. There is mild fat stranding about the gallbladder. There is a new small focus of gas within the gallbladder (axial series 2, image 31).There is no biliary ductal dilation. Pancreas: Normal contours without ductal dilatation. No peripancreatic fluid collection. Spleen: Unremarkable. Adrenals/Urinary Tract: --Adrenal glands: There is a stable right adrenal adenoma. --Right kidney/ureter: Again noted is a large nonobstructing stone in the right kidney measuring approximately 3.2 cm. There is no right-sided hydronephrosis. --Left kidney/ureter: No hydronephrosis or radiopaque kidney stones. 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 The bladder is otherwise unremarkable. Stomach/Bowel: --Stomach/Duodenum: The patient is status post prior gastric bypass. --Small bowel: Unremarkable. --Colon: There is a large amount of stool in the colon. --Appendix: Normal. Vascular/Lymphatic: Atherosclerotic calcification is present within the non-aneurysmal abdominal aorta, without hemodynamically significant stenosis. There is a well-positioned non retrievable IVC filter in place. --No retroperitoneal lymphadenopathy. --No mesenteric lymphadenopathy. --No pelvic or inguinal lymphadenopathy. Reproductive: Unremarkable Other: There is a small amount of free fluid in the patient's pelvis. There is no free  air. Musculoskeletal. Advanced multilevel degenerative changes are noted throughout the visualized thoracolumbar spine. IMPRESSION: 1. New airspace opacity at the right lung base concerning for pneumonia. 2. There is mild fat stranding about the gallbladder. There is a new small focus of gas within the gallbladder. This could be secondary to recent instrumentation or infection. Other differential considerations include peptic ulcer disease correlation with laboratory studies is recommended. If there is clinical concern for acute cholecystitis, follow-up with ultrasound is recommended. 3. Large nonobstructing stone in the right kidney, similar to prior study. 4. Stable right adrenal adenoma. 5. Large amount of stool in the colon. Aortic Atherosclerosis (ICD10-I70.0). Electronically Signed   By: Constance Holster M.D.   On: 02/25/2020 19:54    EKG: Independently reviewed. Interpretation : Sinus rhythm with no acute ST-T wave changes  Assessment/Plan 74 year old male with history of diabetes, HTN, recurrent UTIs with ESBL UTI in 02/2019 and again 01/2020, as well as nephrolithiasis and BPH , presenting with generalized weakness.  Recently hospitalized from 7/15-7/22 with ESBL E. coli, treated with meropenem and transition to Zosyn completing 7 days of antibiotics, discharged with urology follow-up to evaluate for etiology of recurrent UTI.      Generalized weakness -Generalized weakness suspect related to possible acute infection in combination with generalized physical deconditioning related to recent acute illness and hospitalization -Treat acute illness -Physical therapy evaluation for possible rehab placement which patient declined during recent hospitalization    RLL pneumonia on CT -Symptomatic for generalized weakness but no cough or shortness of breath, fever or chills -Continue Rocephin and azithromycin pending procalcitonin -Procalcitonin    Recurrent UTI with recent history of ESBL E. coli  infection, 02/05/2020 -Urinalysis minimally positive -No strong suspicion for current UTI -Recently completed treatment for ESBL E. coli -Await cultures -Contact precautions given recent ESBL -Consider urology consult in the a.m. patient was supposed to follow-up with Dr. Bernardo Heater for outpatient cystoscopy    Abnormal findings on diagnostic imaging of gallbladder -Clinical findings not strongly consistent with acute cholecystitis -Follow ultrasound ordered from the ER    Diabetes mellitus, type II ([place]) -  Sliding scale insulin coverage pending med rec    Hypertension -Continue home meds pending med rec    Chronic diastolic CHF (congestive heart failure) ([facility]) -Continue Lasix and propranolol    BPH (benign prostatic hyperplasia) -Continue Proscar and Flomax       DVT prophylaxis: Lovenox  Code Status: full code  Family Communication:  none  Disposition Plan: Back to previous home environment Consults called: none  Status: Observation    Athena Masse MD [facility]     02/25/2020, 8:36 PM

## 2020-02-25 NOTE — ED Notes (Signed)
US at bedside

## 2020-02-26 ENCOUNTER — Other Ambulatory Visit: Payer: Self-pay

## 2020-02-26 DIAGNOSIS — R918 Other nonspecific abnormal finding of lung field: Secondary | ICD-10-CM

## 2020-02-26 DIAGNOSIS — Z79899 Other long term (current) drug therapy: Secondary | ICD-10-CM | POA: Diagnosis not present

## 2020-02-26 DIAGNOSIS — N200 Calculus of kidney: Secondary | ICD-10-CM | POA: Diagnosis present

## 2020-02-26 DIAGNOSIS — Z1624 Resistance to multiple antibiotics: Secondary | ICD-10-CM | POA: Diagnosis present

## 2020-02-26 DIAGNOSIS — K219 Gastro-esophageal reflux disease without esophagitis: Secondary | ICD-10-CM | POA: Diagnosis present

## 2020-02-26 DIAGNOSIS — Z7982 Long term (current) use of aspirin: Secondary | ICD-10-CM | POA: Diagnosis not present

## 2020-02-26 DIAGNOSIS — J9811 Atelectasis: Secondary | ICD-10-CM | POA: Diagnosis present

## 2020-02-26 DIAGNOSIS — Z87440 Personal history of urinary (tract) infections: Secondary | ICD-10-CM | POA: Diagnosis not present

## 2020-02-26 DIAGNOSIS — F419 Anxiety disorder, unspecified: Secondary | ICD-10-CM | POA: Diagnosis present

## 2020-02-26 DIAGNOSIS — R5383 Other fatigue: Secondary | ICD-10-CM

## 2020-02-26 DIAGNOSIS — I110 Hypertensive heart disease with heart failure: Secondary | ICD-10-CM | POA: Diagnosis present

## 2020-02-26 DIAGNOSIS — Z1612 Extended spectrum beta lactamase (ESBL) resistance: Secondary | ICD-10-CM | POA: Diagnosis present

## 2020-02-26 DIAGNOSIS — B9620 Unspecified Escherichia coli [E. coli] as the cause of diseases classified elsewhere: Secondary | ICD-10-CM | POA: Diagnosis present

## 2020-02-26 DIAGNOSIS — Z87891 Personal history of nicotine dependence: Secondary | ICD-10-CM | POA: Diagnosis not present

## 2020-02-26 DIAGNOSIS — R300 Dysuria: Secondary | ICD-10-CM | POA: Diagnosis present

## 2020-02-26 DIAGNOSIS — N390 Urinary tract infection, site not specified: Secondary | ICD-10-CM

## 2020-02-26 DIAGNOSIS — R531 Weakness: Secondary | ICD-10-CM | POA: Diagnosis present

## 2020-02-26 DIAGNOSIS — F329 Major depressive disorder, single episode, unspecified: Secondary | ICD-10-CM | POA: Diagnosis present

## 2020-02-26 DIAGNOSIS — Z8619 Personal history of other infectious and parasitic diseases: Secondary | ICD-10-CM | POA: Diagnosis not present

## 2020-02-26 DIAGNOSIS — N400 Enlarged prostate without lower urinary tract symptoms: Secondary | ICD-10-CM | POA: Diagnosis present

## 2020-02-26 DIAGNOSIS — Z87442 Personal history of urinary calculi: Secondary | ICD-10-CM | POA: Diagnosis not present

## 2020-02-26 DIAGNOSIS — I5032 Chronic diastolic (congestive) heart failure: Secondary | ICD-10-CM | POA: Diagnosis present

## 2020-02-26 DIAGNOSIS — Z9884 Bariatric surgery status: Secondary | ICD-10-CM | POA: Diagnosis not present

## 2020-02-26 DIAGNOSIS — Z96651 Presence of right artificial knee joint: Secondary | ICD-10-CM | POA: Diagnosis present

## 2020-02-26 DIAGNOSIS — Z8249 Family history of ischemic heart disease and other diseases of the circulatory system: Secondary | ICD-10-CM | POA: Diagnosis not present

## 2020-02-26 DIAGNOSIS — Z20822 Contact with and (suspected) exposure to covid-19: Secondary | ICD-10-CM | POA: Diagnosis present

## 2020-02-26 DIAGNOSIS — E860 Dehydration: Secondary | ICD-10-CM | POA: Diagnosis present

## 2020-02-26 DIAGNOSIS — Z833 Family history of diabetes mellitus: Secondary | ICD-10-CM | POA: Diagnosis not present

## 2020-02-26 LAB — HEMOGLOBIN A1C
Hgb A1c MFr Bld: 5.1 % (ref 4.8–5.6)
Mean Plasma Glucose: 99.67 mg/dL

## 2020-02-26 LAB — HIV ANTIBODY (ROUTINE TESTING W REFLEX): HIV Screen 4th Generation wRfx: NONREACTIVE

## 2020-02-26 LAB — GLUCOSE, CAPILLARY: Glucose-Capillary: 81 mg/dL (ref 70–99)

## 2020-02-26 LAB — PROCALCITONIN: Procalcitonin: <0.1 ng/mL

## 2020-02-26 MED ORDER — BRIMONIDINE TARTRATE 0.2 % OP SOLN
1.0000 [drp] | Freq: Two times a day (BID) | OPHTHALMIC | Status: DC
  Administered 2020-02-27: 1 [drp] via OPHTHALMIC
  Filled 2020-02-26: qty 5

## 2020-02-26 MED ORDER — POLYETHYLENE GLYCOL 3350 17 G PO PACK
34.0000 g | PACK | ORAL | Status: AC
  Administered 2020-02-26: 34 g via ORAL
  Filled 2020-02-26: qty 2

## 2020-02-26 MED ORDER — SIMVASTATIN 20 MG PO TABS
20.0000 mg | ORAL_TABLET | Freq: Every evening | ORAL | Status: DC
  Administered 2020-02-26: 20 mg via ORAL
  Filled 2020-02-26 (×2): qty 1

## 2020-02-26 MED ORDER — CLONAZEPAM 0.25 MG PO TBDP
0.2500 mg | ORAL_TABLET | Freq: Two times a day (BID) | ORAL | Status: DC | PRN
  Administered 2020-02-27: 0.25 mg via ORAL
  Filled 2020-02-26: qty 1

## 2020-02-26 MED ORDER — CYCLOBENZAPRINE HCL 10 MG PO TABS
10.0000 mg | ORAL_TABLET | Freq: Two times a day (BID) | ORAL | Status: DC
  Administered 2020-02-26 – 2020-02-27 (×2): 10 mg via ORAL
  Filled 2020-02-26 (×2): qty 1

## 2020-02-26 MED ORDER — FINASTERIDE 5 MG PO TABS
5.0000 mg | ORAL_TABLET | Freq: Every day | ORAL | Status: DC
  Administered 2020-02-27: 5 mg via ORAL
  Filled 2020-02-26: qty 1

## 2020-02-26 MED ORDER — PANTOPRAZOLE SODIUM 40 MG PO TBEC
40.0000 mg | DELAYED_RELEASE_TABLET | Freq: Every day | ORAL | Status: DC
  Administered 2020-02-27: 40 mg via ORAL
  Filled 2020-02-26: qty 1

## 2020-02-26 MED ORDER — GABAPENTIN 300 MG PO CAPS
300.0000 mg | ORAL_CAPSULE | Freq: Two times a day (BID) | ORAL | Status: DC
  Administered 2020-02-26 – 2020-02-27 (×2): 300 mg via ORAL
  Filled 2020-02-26 (×2): qty 1

## 2020-02-26 MED ORDER — ALLOPURINOL 100 MG PO TABS
300.0000 mg | ORAL_TABLET | Freq: Every day | ORAL | Status: DC
  Administered 2020-02-27: 300 mg via ORAL
  Filled 2020-02-26: qty 3

## 2020-02-26 MED ORDER — CLONAZEPAM 0.25 MG PO TBDP
0.5000 mg | ORAL_TABLET | Freq: Every evening | ORAL | Status: DC | PRN
  Administered 2020-02-26: 0.5 mg via ORAL
  Filled 2020-02-26: qty 2

## 2020-02-26 MED ORDER — LATANOPROST 0.005 % OP SOLN
1.0000 [drp] | Freq: Every day | OPHTHALMIC | Status: DC
  Administered 2020-02-26: 1 [drp] via OPHTHALMIC
  Filled 2020-02-26: qty 2.5

## 2020-02-26 MED ORDER — MIRTAZAPINE 15 MG PO TABS
45.0000 mg | ORAL_TABLET | Freq: Every day | ORAL | Status: DC
  Administered 2020-02-26: 45 mg via ORAL
  Filled 2020-02-26: qty 3

## 2020-02-26 MED ORDER — ZOLPIDEM TARTRATE 5 MG PO TABS
5.0000 mg | ORAL_TABLET | Freq: Every evening | ORAL | Status: DC | PRN
  Administered 2020-02-26: 5 mg via ORAL
  Filled 2020-02-26: qty 1

## 2020-02-26 MED ORDER — TAMSULOSIN HCL 0.4 MG PO CAPS
0.4000 mg | ORAL_CAPSULE | Freq: Every day | ORAL | Status: DC
  Administered 2020-02-26: 0.4 mg via ORAL
  Filled 2020-02-26: qty 1

## 2020-02-26 MED ORDER — ASPIRIN EC 81 MG PO TBEC
81.0000 mg | DELAYED_RELEASE_TABLET | Freq: Every day | ORAL | Status: DC
  Administered 2020-02-27: 81 mg via ORAL
  Filled 2020-02-26: qty 1

## 2020-02-26 NOTE — Progress Notes (Addendum)
PROGRESS NOTE    Curtis Alexander  BSJ:[id number] DOB: 1946/04/24 DOA: 02/25/2020 PCP: [name]   Chief Complaint: Generalized weakness  Assessment & Plan:   Principal Problem:   Generalized weakness Active Problems:   Diabetes mellitus, type II ([place])   Hypertension   Chronic diastolic CHF (congestive heart failure) (HCC)   RLL pneumonia   Recurrent UTI   Abnormal findings on diagnostic imaging of gallbladder   BPH (benign prostatic hyperplasia)   History of ESBL E. coli infection    Curtis Alexander is a 74 y.o. male with medical history significant for diabetes, HTN, recurrent UTIs with ESBL UTI in 02/2019 and again 01/2020, as well as nephrolithiasis and BPH , who presents to the emergency room with generalized weakness that started several days ago with no other notable symptoms.  Patient was recently hospitalized from 7/15-7/22 with ESBL E. coli, treated with meropenem and transition to Zosyn completing 7 days of antibiotics prior to discharged with urology follow-up to evaluate for etiology of recurrent UTI.  Patient reports protracted weakness and inability to care for self at home.   Generalized weakness -Generalized weakness suspect related to possible acute infection in combination with generalized physical deconditioning related to recent acute illness and hospitalization PLAN: -Treat acute illness --PT eval, however, pt already decided not to go to rehab    RLL pneumonia on CT -Symptomatic for generalized weakness but no cough or shortness of breath, fever or chills --started on Rocephin and azithromycin on presentation --procal neg PLAN: --continue ceftriaxone and azithromycin  Hx of Recurrent UTI  recent history of ESBL E. coli infection, 02/05/2020 -Urinalysis minimally positive -No strong suspicion for current UTI -Recently completed treatment for ESBL E. coli PLAN: --ID consult    Abnormal findings on diagnostic imaging of gallbladder -Clinical  findings not strongly consistent with acute cholecystitis -US abdomen showed "No definite cholelithiasis or other evidence of acute Cholecystitis."  Pt has no abdominal pain.  Hx of Diabetes mellitus, type II, not currently active --A1c wnl -No need for BG checks or SSI    Hypertension --BP BP varied, sometimes low in 90's --Hold home propanolol due to low BP    Chronic diastolic CHF (congestive heart failure) (HCC) --Hold home Lasix and propranolol     BPH (benign prostatic hyperplasia) --continue home Proscar and Flomax  Anxiety  --continue home Klonopin as TID PRN   DVT prophylaxis: Lovenox SQ Code Status: Full code  Family Communication:  Status is: change to inpatient Dispo:   The patient is from: home Anticipated d/c is to: home with HHPT Anticipated d/c date is: tomorrow Patient currently is not medically stable to d/c due to: ID consult pending   Subjective and Interval History:  Pt complained of being weak and urine looking dark and smelly, which he believes are due to another UTI.  No urinary symptoms.    Objective: Vitals:   02/25/20 2359 02/26/20 0029 02/26/20 0500 02/26/20 1223  BP: 112/71 (!) 97/50 (!) 99/57 (!) 99/57  Pulse: 77 75 76 79  Resp: 18 20 15 17   Temp:  97.8 F (36.6 C) 97.9 F (36.6 C) 98.6 F (37 C)  TempSrc:  Oral Oral Oral  SpO2: 94% 100% 97% 97%  Weight:      Height:        Intake/Output Summary (Last 24 hours) at 02/26/2020 1639 Last data filed at 02/26/2020 1251 Gross per 24 hour  Intake 1652.42 ml  Output 925 ml  Net 727.42 ml   Curtis Alexander  Weights   02/25/20 1536  Weight: 131 kg    Examination:   Constitutional: NAD, AAOx3 HEENT: conjunctivae and lids normal, EOMI CV: RRR no M,R,G. Distal pulses +2.  No cyanosis.   RESP: CTA B/L over anterior, normal respiratory effort, on 2L GI: +BS, NTND Extremities: 1+ pitting edema in BLE SKIN: warm, dry and intact Neuro: II - XII grossly intact.  Sensation intact   Data  Reviewed: I have personally reviewed following labs and imaging studies  CBC: Recent Labs  Lab 02/25/20 1541  WBC 10.9*  NEUTROABS 7.6  HGB 14.0  HCT 45.1  MCV 98.0  PLT 431   Basic Metabolic Panel: Recent Labs  Lab 02/25/20 1541  NA 140  K 3.9  CL 99  CO2 33*  GLUCOSE 85  BUN 28*  CREATININE 0.76  CALCIUM 8.4*  MG 1.9   GFR: Estimated Creatinine Clearance: 110 mL/min (by C-G formula based on SCr of 0.76 mg/dL). Liver Function Tests: Recent Labs  Lab 02/25/20 1541  AST 13*  ALT 11  ALKPHOS 69  BILITOT 0.7  PROT 6.3*  ALBUMIN 2.9*   No results for input(s): LIPASE, AMYLASE in the last 168 hours. No results for input(s): AMMONIA in the last 168 hours. Coagulation Profile: No results for input(s): INR, PROTIME in the last 168 hours. Cardiac Enzymes: No results for input(s): CKTOTAL, CKMB, CKMBINDEX, TROPONINI in the last 168 hours. BNP (last 3 results) No results for input(s): PROBNP in the last 8760 hours. HbA1C: Recent Labs    02/26/20 0421  HGBA1C 5.1   CBG: Recent Labs  Lab 02/26/20 0743  GLUCAP 81   Lipid Profile: No results for input(s): CHOL, HDL, LDLCALC, TRIG, CHOLHDL, LDLDIRECT in the last 72 hours. Thyroid Function Tests: Recent Labs    02/25/20 1541  TSH 3.506   Anemia Panel: No results for input(s): VITAMINB12, FOLATE, FERRITIN, TIBC, IRON, RETICCTPCT in the last 72 hours. Sepsis Labs: Recent Labs  Lab 02/25/20 1541 02/25/20 1738 02/26/20 0421  PROCALCITON  --   --  <0.10  LATICACIDVEN 1.3 1.0  --     Recent Results (from the past 240 hour(s))  SARS Coronavirus 2 by RT PCR (hospital order, performed in [facility] hospital lab) Nasopharyngeal     Status: None   Collection Time: 02/25/20  6:05 PM   Specimen: Nasopharyngeal  Result Value Ref Range Status   SARS Coronavirus 2 NEGATIVE NEGATIVE Final    Comment: (NOTE) SARS-CoV-2 target nucleic acids are NOT DETECTED.  The SARS-CoV-2 RNA is generally detectable in upper  and lower respiratory specimens during the acute phase of infection. The lowest concentration of SARS-CoV-2 viral copies this assay can detect is 250 copies / mL. A negative result does not preclude SARS-CoV-2 infection and should not be used as the sole basis for treatment or other patient management decisions.  A negative result may occur with improper specimen collection / handling, submission of specimen other than nasopharyngeal swab, presence of viral mutation(s) within the areas targeted by this assay, and inadequate number of viral copies (<250 copies / mL). A negative result must be combined with clinical observations, patient history, and epidemiological information.  Fact Sheet for Patients:   StrictlyIdeas.no  Fact Sheet for Healthcare Providers: BankingDealers.co.za  This test is not yet approved or  cleared by the [place] FDA and has been authorized for detection and/or diagnosis of SARS-CoV-2 by FDA under an Emergency Use Authorization (EUA).  This EUA will remain in effect (meaning this test  can be used) for the duration of the COVID-19 declaration under Section 564(b)(1) of the Act, 21 U.S.C. section 360bbb-3(b)(1), unless the authorization is terminated or revoked sooner.  Performed at [facility], [address] [place], [place] [address]   Blood culture (routine x 2)     Status: None (Preliminary result)   Collection Time: 02/25/20  9:15 PM   Specimen: BLOOD  Result Value Ref Range Status   Specimen Description BLOOD BLOOD LEFT FOREARM  Final   Special Requests   Final    BOTTLES DRAWN AEROBIC AND ANAEROBIC Blood Culture results may not be optimal due to an inadequate volume of blood received in culture bottles   Culture   Final    NO GROWTH < 12 HOURS Performed at [facility], [address], [place], [place] [address]    Report Status PENDING  Incomplete  Blood culture (routine x  2)     Status: None (Preliminary result)   Collection Time: 02/25/20  9:15 PM   Specimen: BLOOD  Result Value Ref Range Status   Specimen Description BLOOD BLOOD RIGHT FOREARM  Final   Special Requests   Final    BOTTLES DRAWN AEROBIC AND ANAEROBIC Blood Culture adequate volume   Culture   Final    NO GROWTH < 12 HOURS Performed at [facility], [address] [place], [place] [address]    Report Status PENDING  Incomplete      Radiology Studies: DG Chest 2 View  Result Date: 02/25/2020 CLINICAL DATA:  Fatigue. EXAM: CHEST - 2 VIEW COMPARISON:  February 05, 2020. FINDINGS: Stable cardiomediastinal silhouette. No pneumothorax or pleural effusion is noted. Left lung is clear. Mild right basilar atelectasis or infiltrate is noted. Bony thorax is unremarkable. IMPRESSION: Mild right basilar subsegmental atelectasis or infiltrate. Electronically Signed   By: Marijo Conception M.D.   On: 02/25/2020 16:10   CT ABDOMEN PELVIS W CONTRAST  Result Date: 02/25/2020 CLINICAL DATA:  Global weakness and fatigue peer concern for urinary tract infection. EXAM: CT ABDOMEN AND PELVIS WITH CONTRAST TECHNIQUE: Multidetector CT imaging of the abdomen and pelvis was performed using the standard protocol following bolus administration of intravenous contrast. CONTRAST:  157mL OMNIPAQUE IOHEXOL 300 MG/ML  SOLN COMPARISON:  CT dated February 09, 2020 FINDINGS: Lower chest: There is a new airspace opacity at the right lung base concerning for pneumonia.The heart size is mildly enlarged. The main pulmonary artery is dilated measuring approximately 4.2 cm. There are coronary artery calcifications. Hepatobiliary: There is a stable cyst within the right hepatic lobe. There is mild fat stranding about the gallbladder. There is a new small focus of gas within the gallbladder (axial series 2, image 31).There is no biliary ductal dilation. Pancreas: Normal contours without ductal dilatation. No peripancreatic fluid collection.  Spleen: Unremarkable. Adrenals/Urinary Tract: --Adrenal glands: There is a stable right adrenal adenoma. --Right kidney/ureter: Again noted is a large nonobstructing stone in the right kidney measuring approximately 3.2 cm. There is no right-sided hydronephrosis. --Left kidney/ureter: No hydronephrosis or radiopaque kidney stones. 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 The bladder is otherwise unremarkable. Stomach/Bowel: --Stomach/Duodenum: The patient is status post prior gastric bypass. --Small bowel: Unremarkable. --Colon: There is a large amount of stool in the colon. --Appendix: Normal. Vascular/Lymphatic: Atherosclerotic calcification is present within the non-aneurysmal abdominal aorta, without hemodynamically significant stenosis. There is a well-positioned non retrievable IVC filter in place. --No retroperitoneal lymphadenopathy. --No mesenteric lymphadenopathy. --No pelvic  or inguinal lymphadenopathy. Reproductive: Unremarkable Other: There is a small amount of free fluid in the patient's pelvis. There is no free air. Musculoskeletal. Advanced multilevel degenerative changes are noted throughout the visualized thoracolumbar spine. IMPRESSION: 1. New airspace opacity at the right lung base concerning for pneumonia. 2. There is mild fat stranding about the gallbladder. There is a new small focus of gas within the gallbladder. This could be secondary to recent instrumentation or infection. Other differential considerations include peptic ulcer disease correlation with laboratory studies is recommended. If there is clinical concern for acute cholecystitis, follow-up with ultrasound is recommended. 3. Large nonobstructing stone in the right kidney, similar to prior study. 4. Stable right adrenal adenoma. 5. Large amount of stool in the colon. Aortic Atherosclerosis (ICD10-I70.0). Electronically Signed   By: Constance Holster M.D.   On: 02/25/2020 19:54   US  Abdomen Limited RUQ  Result Date: 02/25/2020 CLINICAL DATA:  Gallbladder disease.  Abnormal CT EXAM: ULTRASOUND ABDOMEN LIMITED RIGHT UPPER QUADRANT COMPARISON:  CT abdomen pelvis 02/25/2020 FINDINGS: Gallbladder: Limited visualization due to poor acoustic window. There is no gallbladder wall thickening or pericholecystic fluid. No definite stones visualized within the gallbladder. Common bile duct: Diameter: 4 mm Liver: Increased hepatic echogenicity. There is a cyst in the right hepatic lobe measures 2.5 x 2.5 x 2.0 cm. Portal vein is patent on color Doppler imaging with normal direction of blood flow towards the liver. Other: None. IMPRESSION: Limited visualization of the gallbladder due to poor acoustic window. No definite cholelithiasis or other evidence of acute cholecystitis. Electronically Signed   By: Ulyses Jarred M.D.   On: 02/25/2020 21:14     Scheduled Meds: . enoxaparin (LOVENOX) injection  40 mg Subcutaneous Q24H  . polyethylene glycol  34 g Oral Q2H   Continuous Infusions: . azithromycin    . cefTRIAXone (ROCEPHIN)  IV       LOS: 0 days     Enzo Bi, MD [facility] If 7PM-7AM, please contact night-coverage 02/26/2020, 4:39 PM

## 2020-02-26 NOTE — Consult Note (Signed)
NAME: Curtis Alexander  DOB: Apr 26, 1946  MRN: [id number]  Date/Time: 02/26/2020 1:25 PM  REQUESTING PROVIDER: Dr. Billie Ruddy Subjective:  REASON FOR CONSULT: UTI ? Curtis Alexander is a 74 y.o. male diabetes mellitus, hypertension, OSA, history of gastric bypass     Admitted with generalized weakness.    Patient assumes that he has urinary tract infection when he feels weak every single time.    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  He had been referred to urology for ruling out recurrent UTIs and was scheduled to undergo a cystoscopy but patient ended up in the hospital instead. Patient wanted to have left total knee replacement but Dr. Marry Guan consider him to be high risk candidate and because of this UTI he was referred to urology to figure out a solution. Patient has been hospitalized for the same condition many times in the past. In August 2020 he underwent  right total knee arthroplasty following that he became drowsy and had low blood pressure.  During the time of blood in the urine culture was sent and the urine had ESBL E. coli and he was treated for 48 hours with meropenem but there was a concern that his hypotension and drowsiness could have been from fentanyl and dehydration.  A cortisol level was checked then and it was normal.  He also had macrocytic anemia at that time.  After his knee replacement he went to rehab and stayed there for many months.  Since getting out of the rehab he has been hospitalized at least 3 times at [facility].  Every single time it has been generalized weakness and the urine is tested and it is found to have bacteria and pyuria and hence he is being diagnosed as UTI and given antibiotics with some improvement only for the weakness to return immediately.Marland Kitchen    He was first in the hospital between 6/18 -01/11/20 Complaining of generalized weakness  and treated as UTI. He was readmitted on 02/05/2020 with weakness and fatigue of 2 to 3 days duration and told the physicians that he has UTI and got treated again with meropenem. Other than dark urine and foul-smelling urine patient did not have any fever or leukocytosis or abdominal pain or dysuria.  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   During this ED presentation the vitals were temperature of 98.9, blood pressure of 150/74, pulse ox of 93% and heart rate of 84.  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  Incidentally was noted  right lower lobe infiltrate.  Patient had a UA and that showed more than 50 WBC.  Urine culture has been sent and patient has been started on ceftriaxone and azithromycin for possible pneumonia.  Patient on questioning says he has a cough and is bringing out sputum more varied color from light green to dark red.  The procalcitonin is less than 0.10 making bacterial pneumonia unlikely. I am asked to see the patient to see whether he has UTI or pneumonia. Dr. Billie Ruddy had told the patient that he did not have either 1 of those and wanted to stop  his antibiotics and patient wanted infectious disease to see him.    Past medical history Osteoarthritis Diabetes mellitus Cervical spine stenosis Essential tremor GERD Hiatal hernia Kidney stones Hypertension Sleep apnea Lumbar stenosis Morbid obesity for which he underwent gastric bypass surgery. Depression RT TKA -aug 2020  Past surgical history Gastric bypass done on 01/18/2004. Insertion of [place] filter in 2005 Abdominoplasty and repair of ventral hernia 04/29/2007.   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 Debridement of abdominal wall necrosis on 05/14/2007. Fracture surgery age 65.  Social history Quit smoking 39 years ago  Family history Cancer mother Diabetes Father Heart failure Father Bipolar disorder daughter   No Known Allergies   ? Current Facility-Administered Medications  Medication Dose Route Frequency Provider Last Rate Last Admin  . azithromycin (ZITHROMAX) 500 mg in sodium chloride 0.9 % 250 mL IVPB  500 mg Intravenous Q24H Judd Gaudier V, MD      . cefTRIAXone (ROCEPHIN) 2 g in sodium chloride 0.9 % 100 mL IVPB  2 g Intravenous Q24H Judd Gaudier V, MD      . enoxaparin (LOVENOX) injection 40 mg  40 mg Subcutaneous Q24H Athena Masse, MD   40 mg at 02/26/20 1252  . polyethylene glycol (MIRALAX / GLYCOLAX) packet 34 g  34 g Oral Einar Gip, MD   34 g at 02/26/20 1252     Abtx:  Anti-infectives (From admission, onward)   Start     Dose/Rate Route Frequency Ordered Stop   02/26/20 2000  azithromycin (ZITHROMAX) 500 mg in sodium chloride 0.9 % 250 mL IVPB     Discontinue     500 mg 250 mL/hr over 60 Minutes Intravenous Every 24 hours 02/25/20 2353 03/02/20 1959   02/26/20 1800  cefTRIAXone (ROCEPHIN) 2 g in sodium chloride 0.9 % 100 mL IVPB     Discontinue     2 g 200 mL/hr over 30 Minutes Intravenous Every 24 hours 02/25/20 2353 03/02/20 1759   02/25/20 2015  azithromycin (ZITHROMAX) 500 mg in sodium chloride 0.9 % 250 mL IVPB        500 mg 250 mL/hr over 60 Minutes Intravenous  Once 02/25/20 2004 02/25/20 2350   02/25/20 1900  cefTRIAXone (ROCEPHIN) 1 g in sodium chloride 0.9 % 100 mL IVPB        1 g 200 mL/hr over 30 Minutes Intravenous  Once 02/25/20 1848 02/25/20 2115      REVIEW OF SYSTEMS:  Const: negative fever, negative chills, negative weight loss Eyes: negative diplopia or visual changes, negative eye pain ENT: negative coryza, negative sore throat Resp: negative cough, hemoptysis, dyspnea Cards: negative for  chest pain, palpitations, lower extremity edema GU: negative for frequency, dysuria and hematuria GI: Negative for abdominal pain, diarrhea, bleeding, constipation Skin: negative for rash and pruritus Heme: negative for easy bruising and gum/nose bleeding MS: Generalized weakness Neurolo:negative for headaches, dizziness, vertigo, memory problems  Psych:anxiety, depression  Endocrine:, diabetes Allergy/Immunology- negative for any medication or food allergies ?  Objective:  VITALS:  BP (!) 99/57 (BP Location: Left Arm)   Pulse 79   Temp 98.6 F (37 C) (Oral)   Resp 17   Ht 6' (1.829 m)   Wt 131 kg   SpO2 97%   BMI 39.17 kg/m  PHYSICAL EXAM:  General: Sallow complexion, alert, cooperative, no distress, appears stated age.  Head: Normocephalic, without obvious abnormality, atraumatic. Eyes: Conjunctivae clear, anicteric sclerae. Pupils are equal ENT Nares  normal. No drainage or sinus tenderness. Lips, mucosa, and tongue normal. No Thrush Neck: Supple, symmetrical, no adenopathy, thyroid: non tender no carotid bruit and no JVD. Back: Did not examine Lungs: Bilateral air entry.  Decreased bases Heart: Regular rate and rhythm, no murmur, rub or gallop. Abdomen: Soft, non-tender,not distended. Bowel sounds normal. No masses Lap scar Extremities: Edema legs Skin: Old scars and scabs on the right leg.  Left knee abrasion  Lymph: Cervical, supraclavicular normal. Neurologic: Grossly non-focal Pertinent Labs Lab Results CBC    Component Value Date/Time   WBC 10.9 (H) 02/25/2020 1541   RBC 4.60 02/25/2020 1541   HGB 14.0 02/25/2020 1541   HCT 45.1 02/25/2020 1541   PLT 190 02/25/2020 1541   MCV 98.0 02/25/2020 1541   MCH 30.4 02/25/2020 1541   MCHC 31.0 02/25/2020 1541   RDW 14.1 02/25/2020 1541   LYMPHSABS 2.0 02/25/2020 1541   MONOABS 0.9 02/25/2020 1541   EOSABS 0.4 02/25/2020 1541   BASOSABS 0.1 02/25/2020 1541    CMP Latest Ref Rng & Units 02/25/2020 02/08/2020  02/06/2020  Glucose 70 - 99 mg/dL 85 102(H) 97  BUN 8 - 23 mg/dL 28(H) 19 21  Creatinine 0.61 - 1.24 mg/dL 0.76 0.65 0.66  Sodium 135 - 145 mmol/L 140 141 143  Potassium 3.5 - 5.1 mmol/L 3.9 4.4 3.8  Chloride 98 - 111 mmol/L 99 96(L) 100  CO2 22 - 32 mmol/L 33(H) 33(H) 34(H)  Calcium 8.9 - 10.3 mg/dL 8.4(L) 8.6(L) 8.2(L)  Total Protein 6.5 - 8.1 g/dL 6.3(L) - 5.5(L)  Total Bilirubin 0.3 - 1.2 mg/dL 0.7 - 0.6  Alkaline Phos 38 - 126 U/L 69 - 69  AST 15 - 41 U/L 13(L) - 17  ALT 0 - 44 U/L 11 - 12      Microbiology: Recent Results (from the past 240 hour(s))  SARS Coronavirus 2 by RT PCR (hospital order, performed in [place] hospital lab) Nasopharyngeal     Status: None   Collection Time: 02/25/20  6:05 PM   Specimen: Nasopharyngeal  Result Value Ref Range Status   SARS Coronavirus 2 NEGATIVE NEGATIVE Final    Comment: (NOTE) SARS-CoV-2 target nucleic acids are NOT DETECTED.  The SARS-CoV-2 RNA is generally detectable in upper and lower respiratory specimens during the acute phase of infection. The lowest concentration of SARS-CoV-2 viral copies this assay can detect is 250 copies / mL. A negative result does not preclude SARS-CoV-2 infection and should not be used as the sole basis for treatment or other patient management decisions.  A negative result may occur with improper specimen collection / handling, submission of specimen other than nasopharyngeal swab, presence of viral mutation(s) within the areas targeted by this assay, and inadequate number of viral copies (<250 copies / mL). A negative result must be combined with clinical observations, patient history, and epidemiological information.  Fact Sheet for Patients:   StrictlyIdeas.no  Fact Sheet for Healthcare Providers: BankingDealers.co.za  This test is not yet approved or  cleared by the [place] FDA and has been authorized for detection and/or diagnosis of  SARS-CoV-2 by FDA under an Emergency Use Authorization (EUA).  This EUA will remain in effect (meaning this test can be used) for the duration of the COVID-19 declaration under Section 564(b)(1) of the Act, 21 U.S.C. section 360bbb-3(b)(1), unless the authorization is terminated or revoked sooner.  Performed at [facility], [address] [place], [place] [address]   Blood culture (routine x 2)  Status: None (Preliminary result)   Collection Time: 02/25/20  9:15 PM   Specimen: BLOOD  Result Value Ref Range Status   Specimen Description BLOOD BLOOD LEFT FOREARM  Final   Special Requests   Final    BOTTLES DRAWN AEROBIC AND ANAEROBIC Blood Culture results may not be optimal due to an inadequate volume of blood received in culture bottles   Culture   Final    NO GROWTH < 12 HOURS Performed at [facility], [address], [place], [place] [address]    Report Status PENDING  Incomplete  Blood culture (routine x 2)     Status: None (Preliminary result)   Collection Time: 02/25/20  9:15 PM   Specimen: BLOOD  Result Value Ref Range Status   Specimen Description BLOOD BLOOD RIGHT FOREARM  Final   Special Requests   Final    BOTTLES DRAWN AEROBIC AND ANAEROBIC Blood Culture adequate volume   Culture   Final    NO GROWTH < 12 HOURS Performed at [facility], [address], [place],  [address]    Report Status PENDING  Incomplete    IMAGING RESULTS:  I have personally reviewed the films ? Impression/Recommendation 74 year old male presenting with generalized weakness.  This is his fourth presentation.  Every single time is being treated as UTI because he thinks he has UTI.  He is colonized with ESBL E. Coli.   Colonization with ESBL E. coli.  I do not think he has an acute infection currently and his weakness is not secondary to a urinary tract infection. But the big question is why is he colonized with ESBL E. coli which has the potential  to become an infection. 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 coli. 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 Would do a post void bladder scan to look for incomplete emptying. Would recommend urology follow-up as a cystoscopy would help to evaluate this diverticulum. Also it may be worth looking to see whether the anatomical deformity of his genitalia can be corrected.  Before cystoscopy he can be treated for the E. coli in the urine with a the IV ertapenem followed by oral fosfomycin.  But I would not recommend treating him with antibiotics currently.   We need to look for other causes of fatigue and weakness.  History status post gastric bypass surgery.  Need to make sure that his minerals micro and micronutrient levels are fine He has depression and is on 2 medications.  Need to rule out medications causing weakness and low blood pressure His thyroid function is normal.   will also recommend checking a cortisol level as is the blood pressure seems to fluctuate.  ?Incidental finding of right lower lobe infiltrate.  This could be atelectasis.  He has no leukocytosis, procalcitonin is less than 0.1.  So bacterial infection is less likely.  So would hold off on giving ceftriaxone and azithromycin and watch him closely.  I explained this to the patient in great detail ? __Thank you for the consult and I will follow the patient along with you.  _________________________________________________ Discussed with patient, requesting provider Note:  This document was prepared using Dragon voice recognition software and may include unintentional dictation errors.

## 2020-02-26 NOTE — Evaluation (Signed)
Physical Therapy Evaluation Patient Details Name: Curtis Alexander MRN: [id number] DOB: 07/01/1946 Today's Date: 02/26/2020   History of Present Illness  Pt is a 74 y.o. male with medical history significant for diabetes, HTN, recurrent UTIs with ESBL UTI in 02/2019 and again 01/2020, as well as nephrolithiasis and BPH , who presents to the emergency room with generalized weakness that started several days ago with no other notable symptoms.  Patient was recently hospitalized from 7/15-7/22 with ESBL E. coli, treated with meropenem and transition to Zosyn completing 7 days of antibiotics prior to discharged with urology follow-up to evaluate for etiology of recurrent UTI.  Patient reports protracted weakness and inability to care for self at home.    Clinical Impression  Pt alert, in bed, agreeable to PT with mod encouragement, pt reported he was too weak for mobility. Pt with extensive DME at home and family assistance, ramped entrance and bed/bath on same level. Per pt he was able to ambulate ~30ft with RW and close chair follow from his daughter. Pt does sleep in his lift chair and reported he uses this to help him stand, and pivots to [facility] with walker.  The patient was able to lift all extremities against gravity, good UE strength noted. Pt able to lift LEs independently. The patient was able to perform supine to sit with bed rails and extended time, modI. He was able to sit EOB for several minutes with good balance. Pt adamantly did not want to attempt standing today due to being "weak as a kitten" despite education and encouragement. Returned to supine with supervision, repositioned in bed modI.  Overall the patient demonstrated deficits (see "PT Problem List") that impede the patient's functional abilities, safety, and mobility and would benefit from skilled PT intervention. Recommendation is HHPT pending pt progress.     Follow Up Recommendations Home health PT;Supervision/Assistance - 24 hour     Equipment Recommendations  None recommended by PT    Recommendations for Other Services       Precautions / Restrictions Precautions Precautions: Fall Precaution Comments: per chart pt has knee brace that he wears and walks better with Restrictions Weight Bearing Restrictions: No      Mobility  Bed Mobility Overal bed mobility: Needs Assistance Bed Mobility: Sit to Supine;Supine to Sit     Supine to sit: Modified independent (Device/Increase time);HOB elevated Sit to supine: Modified independent (Device/Increase time);HOB elevated   General bed mobility comments: bed placed in trendelenburg to scoot up in bed, no physical assist needed  Transfers                 General transfer comment: Pt did not want to do any standing today despite education and encouragement. reported he was too weak  Ambulation/Gait                Stairs            Wheelchair Mobility    Modified Rankin (Stroke Patients Only)       Balance Overall balance assessment: Needs assistance Sitting-balance support: Feet supported Sitting balance-Leahy Scale: Good Sitting balance - Comments: steady sitting EOB       Standing balance comment: deferred                             Pertinent Vitals/Pain Pain Assessment: No/denies pain    Home Living Family/patient expects to be discharged to:: Private residence Living Arrangements: Children Available Help at  Discharge: Family;Available 24 hours/day (daughter and granddaughter (2yr old)) Type of Home: House Home Access: Ramped entrance     Home Layout: Able to live on main level with bedroom/bathroom Home Equipment: Walker - 2 wheels;Bedside commode;Shower seat;Grab bars - toilet;Wheelchair - manual;Other (comment) (lift chair) Additional Comments: Per PT note, all living space is in the upper floor. Pt enters this top floor from car port. Can pull himself up from toilet. Rolls a chair into the bathroom to sit  during sponge bathing and grooming at the sink. lift chair in home, sleeps in chair    Prior Function Level of Independence: Needs assistance   Gait / Transfers Assistance Needed: was walking with walker 79ft after rehab in May. daughter provides chair follow with WC when pt is walking in the house. Pt does endorse a fall yesterday (8/4). fell trying to get into the car, did need fire department to help him up.  ADL's / Homemaking Assistance Needed: Pt reports Setup A for sponge bathing and dressing. He uses a sock aide. Requires assistance to get into bathroom to transfer to commode for BM. Uses urinal otherwise. Daughter assists pt with meal prep, household chores, driving, etc.  Comments: Patient reports he had R knee surgery last Aug and then a patellar tendon rupture on the same knee in September. He went to rehab and was able to walk 30 feet with the RW. He has had at least 5 UTIs since that surgery. Since his surgery, he has had difficulty with static standing endurance, but prior to this hospitalization, he was able to ambulate 17-18 feet in his home using the walker with the home PT following him with a w/c.     Hand Dominance   Dominant Hand: Left    Extremity/Trunk Assessment   Upper Extremity Assessment RUE Deficits / Details: 3+/5 shoulder flexion, grossly 4-/5 otherwise, has a chronic rotator cuff injury LUE Deficits / Details: grossly 4/5    Lower Extremity Assessment RLE Deficits / Details: difficulty with TKE in sitting bilaterally. Able to lift against gravity in bed and in sitting    Cervical / Trunk Assessment Cervical / Trunk Assessment: Kyphotic  Communication   Communication: No difficulties  Cognition Arousal/Alertness: Awake/alert Behavior During Therapy: WFL for tasks assessed/performed Overall Cognitive Status: Within Functional Limits for tasks assessed                                 General Comments: Pt is A&O x4. He is motivated to  participate in therapy and demonstrates great safety awareness.      General Comments      Exercises     Assessment/Plan    PT Assessment Patient needs continued PT services  PT Problem List Decreased strength;Decreased range of motion;Decreased activity tolerance;Decreased balance;Obesity;Decreased mobility;Decreased skin integrity       PT Treatment Interventions DME instruction;Balance training;Gait training;Neuromuscular re-education;Cognitive remediation;Functional mobility training;Patient/family education;Therapeutic activities;Therapeutic exercise;Wheelchair mobility training    PT Goals (Current goals can be found in the Care Plan section)  Acute Rehab PT Goals Patient Stated Goal: to go home PT Goal Formulation: With patient Time For Goal Achievement: 03/11/20 Potential to Achieve Goals: Fair    Frequency Min 2X/week   Barriers to discharge        Co-evaluation               AM-PAC PT "6 Clicks" Mobility  Outcome Measure Help needed turning from your  back to your side while in a flat bed without using bedrails?: A Little Help needed moving from lying on your back to sitting on the side of a flat bed without using bedrails?: A Little Help needed moving to and from a bed to a chair (including a wheelchair)?: A Little Help needed standing up from a chair using your arms (e.g., wheelchair or bedside chair)?: A Little Help needed to walk in hospital room?: A Lot Help needed climbing 3-5 steps with a railing? : Total 6 Click Score: 15    End of Session Equipment Utilized During Treatment: Gait belt Activity Tolerance: Patient limited by fatigue;Other (comment) (pt self limiting) Patient left: in bed;with bed alarm set;with call bell/phone within reach Nurse Communication: Mobility status PT Visit Diagnosis: Muscle weakness (generalized) (M62.81);Difficulty in walking, not elsewhere classified (R26.2)    Time: 9672-8979 PT Time Calculation (min) (ACUTE  ONLY): 28 min   Charges:   PT Evaluation $PT Eval Low Complexity: 1 Low PT Treatments $Therapeutic Exercise: 8-22 mins       Lieutenant Diego PT, DPT 2:28 PM,02/26/20

## 2020-02-26 NOTE — Progress Notes (Signed)
Patient canceled cystoscopy stating he was going to the ED and wanted to be admitted for a UTI

## 2020-02-27 LAB — BASIC METABOLIC PANEL
Anion gap: 6 (ref 5–15)
BUN: 17 mg/dL (ref 8–23)
CO2: 35 mmol/L — ABNORMAL HIGH (ref 22–32)
Calcium: 8 mg/dL — ABNORMAL LOW (ref 8.9–10.3)
Chloride: 101 mmol/L (ref 98–111)
Creatinine, Ser: 0.53 mg/dL — ABNORMAL LOW (ref 0.61–1.24)
GFR calc Af Amer: >60 mL/min (ref >60)
GFR calc non Af Amer: >60 mL/min (ref >60)
Glucose, Bld: 90 mg/dL (ref 70–99)
Potassium: 4.2 mmol/L (ref 3.5–5.1)
Sodium: 142 mmol/L (ref 135–145)

## 2020-02-27 LAB — URINALYSIS, COMPLETE
Bilirubin, UA: NEGATIVE
Glucose, UA: NEGATIVE
Ketones, UA: NEGATIVE
Nitrite, UA: NEGATIVE
Specific Gravity, UA: 1.025 (ref 1.005–1.030)
Urobilinogen, Ur: 2 mg/dL — ABNORMAL HIGH (ref 0.2–1.0)
pH, UA: 6 (ref 5.0–7.5)

## 2020-02-27 LAB — MAGNESIUM: Magnesium: 1.9 mg/dL (ref 1.7–2.4)

## 2020-02-27 LAB — CBC
HCT: 40.4 % (ref 39.0–52.0)
Hemoglobin: 12.7 g/dL — ABNORMAL LOW (ref 13.0–17.0)
MCH: 30.8 pg (ref 26.0–34.0)
MCHC: 31.4 g/dL (ref 30.0–36.0)
MCV: 97.8 fL (ref 80.0–100.0)
Platelets: 147 10*3/uL — ABNORMAL LOW (ref 150–400)
RBC: 4.13 MIL/uL — ABNORMAL LOW (ref 4.22–5.81)
RDW: 13.7 % (ref 11.5–15.5)
WBC: 5.2 10*3/uL (ref 4.0–10.5)
nRBC: 0 % (ref 0.0–0.2)

## 2020-02-27 LAB — MICROSCOPIC EXAMINATION

## 2020-02-27 LAB — EXPECTORATED SPUTUM ASSESSMENT W GRAM STAIN, RFLX TO RESP C: Special Requests: NORMAL

## 2020-02-27 LAB — CORTISOL-AM, BLOOD: Cortisol - AM: 10.3 ug/dL (ref 6.7–22.6)

## 2020-02-27 MED ORDER — ZOLPIDEM TARTRATE 5 MG PO TABS: 5.0000 mg | ORAL_TABLET | Freq: Every evening | ORAL | 0 refills | Status: AC | PRN

## 2020-02-27 MED ORDER — FERROUS SULFATE 325 (65 FE) MG PO TABS: 325.0000 mg | ORAL_TABLET | ORAL | Status: AC

## 2020-02-27 MED ORDER — PROPRANOLOL HCL 40 MG PO TABS: ORAL_TABLET | ORAL | Status: AC

## 2020-02-27 NOTE — Progress Notes (Signed)
Curtis Alexander and O x4. VSS. Pt tolerating diet well. No complaints of nausea or vomiting. IV removed intact, prescriptions given. Pt voices understanding of discharge instructions with no further questions. Patient discharged via wheelchair with RN and NT  Allergies as of 02/27/2020   No Known Allergies     Medication List    STOP taking these medications   diazepam 2 MG tablet Commonly known as: VALIUM     TAKE these medications   allopurinol 300 MG tablet Commonly known as: ZYLOPRIM Take 300 mg by mouth daily with lunch.   aspirin EC 81 MG tablet Take 81 mg by mouth daily. Swallow whole.   B-12 2500 MCG Tabs Take 2,500 mcg by mouth daily.   brimonidine 0.2 % ophthalmic solution Commonly known as: ALPHAGAN Place 1 drop into the right eye 2 (two) times daily.   clonazePAM 0.5 MG tablet Commonly known as: KLONOPIN Take 0.25 in the morning, 0.25 at lunch, 0.5 mg at bedtime   Cranberry 250 MG Tabs Take 250 mg by mouth daily.   cyclobenzaprine 10 MG tablet Commonly known as: FLEXERIL Take 10 mg by mouth 2 (two) times daily.   ferrous sulfate 325 (65 FE) MG tablet Take 1 tablet (325 mg total) by mouth every other day.   finasteride 5 MG tablet Commonly known as: PROSCAR Take 5 mg by mouth daily with lunch.   furosemide 20 MG tablet Commonly known as: Lasix Take 1 tablet (20 mg total) by mouth daily.   gabapentin 300 MG capsule Commonly known as: NEURONTIN Take 300 mg by mouth 2 (two) times daily.   latanoprost 0.005 % ophthalmic solution Commonly known as: XALATAN Place 1 drop into the right eye at bedtime.   loratadine 10 MG tablet Commonly known as: CLARITIN Take 10 mg by mouth daily as needed for allergies.   mirtazapine 45 MG tablet Commonly known as: REMERON Take 45 mg by mouth at bedtime.   nystatin powder Commonly known as: MYCOSTATIN/NYSTOP Apply topically 2 (two) times daily. Apply to abdominal folds /chest wall crease   omeprazole 40 MG  capsule Commonly known as: PRILOSEC Take 40 mg by mouth daily with lunch.   primidone 50 MG tablet Commonly known as: MYSOLINE Take 50-75 mg by mouth See admin instructions. Take 1 tablets (75mg ) by mouth every morning and take 1 tablet (50mg ) by mouth every afternoon   propranolol 40 MG tablet Commonly known as: INDERAL Hold this medication until outpatient doctor's followup because your blood pressure has been low normal during your hospitalization. What changed:   how much to take  how to take this  when to take this  additional instructions   simvastatin 20 MG tablet Commonly known as: ZOCOR Take 1 tablet by mouth every evening.   tamsulosin 0.4 MG Caps capsule Commonly known as: FLOMAX Take 0.4 mg by mouth at bedtime.   Vitamin D 50 MCG (2000 UT) Caps Take 2,000 Units by mouth daily.   zolpidem 5 MG tablet Commonly known as: AMBIEN Take 1 tablet (5 mg total) by mouth at bedtime as needed for sleep. What changed:   when to take this  reasons to take this       Vitals:   02/27/20 0428 02/27/20 1348  BP: 109/60 130/63  Pulse: 97 97  Resp: 20 20  Temp: 98.2 F (36.8 C) 98.9 F (37.2 C)  SpO2: 93% 93%    Curtis Alexander

## 2020-02-27 NOTE — TOC Transition Note (Signed)
Transition of Care [facility]) - CM/SW Discharge Note   Patient Details  Name: Curtis Alexander MRN: [id number] Date of Birth: December 28, 1945  Transition of [facility]) CM/SW Contact:  Candie Chroman, LCSW Phone Number: 02/27/2020, 3:10 PM   Clinical Narrative: Readmission prevention screen is complete. CSW met with patient. No supports at bedside. CSW introduced role and explained that discharge planning would be discussed. Patient's daughter lives with him. His wife is a long-term resident at a SNF in [place], [place]. His other daughter lives in [place], [place]. Patient's daughter that lives with him transports him as needed. He receives home health PT through [facility]. They are able to add OT as well and will discuss possibility of a private pay aide with him. Patient thinks this would be cheaper than going to a SNF again. Patient recently private paid to go to [facility]. It cost him around $300 a day because he has used up all of his SNF days. Discussed patient's preference to return home rather than SNF with daughter [name]. Patient stands and pivots to walker and wheelchair at home. He sleeps in a recliner. No further concerns. Patient has orders to discharge home today. His daughter will pick him up. CSW signing off.  Final next level of care: Home w Home Health Services Barriers to Discharge: No Barriers Identified   Patient Goals and CMS Choice     Choice offered to / list presented to : NA  Discharge Placement                       Discharge Plan and Services     Post Acute Care Choice: Resumption of Svcs/PTA Provider                    HH Arranged: PT, OT [place] Agency: [place] Date [place]: 02/27/20      Social Determinants of Health (SDOH) Interventions     Readmission Risk Interventions Readmission Risk Prevention Plan 02/27/2020 03/30/2019  Transportation Screening Complete Complete  PCP or Specialist Appt within 5-7 Days -  Complete  PCP or Specialist Appt within 3-5 Days Complete -  Home Care Screening - Complete  Medication Review (RN CM) - Complete  HRI or Home Care Consult Complete -  Social Work Consult for Recovery Care Planning/Counseling Complete -  Palliative Care Screening Not Applicable -

## 2020-02-27 NOTE — Discharge Summary (Signed)
Physician Discharge Summary   Curtis Alexander  male DOB: 1945-11-28  QIO:[id number]  PCP: [name]  Admit date: 02/25/2020 Discharge date: 02/27/2020  Admitted From: home Disposition:  home Home Health: Yes CODE STATUS: Full code  Discharge Instructions    Discharge instructions   Complete by: As directed    You have had Infectious Disease consult during this hospitalization who does not think you have urinary track infection or pneumonia, so you don't need any antibiotics currently.  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    Please follow up with your outpatient urologist.  Per ID, your urologist will need to give you antibiotic before and after urologic procedures to prevent bacteria in the urine from getting to other parts of your body.  Your weakness is likely due to deconditioning and possible poor nutrition.  Please continue physical therapy and follow up with your primary care doctor.   Dr. Enzo Bi [facility] Course:  For full details, please see H&P, progress notes, consult notes and ancillary notes.  Briefly,  Curtis Alexander a 74 y.o.Caucasian malewith medical history significant fordiabetes, HTN, recurrent UTIs with ESBL UTI in 02/2019 and again 01/2020, as well as nephrolithiasis and BPH, who presented to the emergency room with generalized weakness that started several days ago with no other notable symptoms.   Patient was recently hospitalized from 7/15-7/22 with ESBL E. coli, treated with meropenem and transition to Zosyn completing 7 days of antibiotics prior to [facility] follow-up to evaluate for etiology of recurrent UTI. Patient reported protracted weakness and inability to care for self at home.   Generalized weakness Generalized weakness due to generalized physical deconditioning related to recent  acute illness and hospitalization.  At baseline, pt is mostly sedentary, can pivot to wheelchair with walker.  Pt with extensive DME at home and family assistance, ramped entrance and bed/bath on same level.  Pt does not have more Medicare days left for rehab, and pt himself also adamantly refused to go back to rehab.  Pt is ordered [facility] and OT at discharge.    RLL pneumoniaruled out Pt reported generalized weakness but no cough or shortness of breath, fever or chills.  CT a/p reported "New airspace opacity at the right lung base" therefore pt was started on Rocephin and azithromycin on presentation.  Procal neg.  No leukocytosis.  ID was consulted who believed opacity due to atelectasis, and d/c'ed abx.    Hx of frequent treatment of asymptomatic UTI history of ESBL E. coli bacteruria, 02/05/2020 Colonization with ESBL E. coli Pt presented convinced that he had an UTI that needed treatment.  No urinary symptoms, no fever, no leukocytosis.  Pt reported weakness, dark and foul-smelling urine evidence of his UTI.  ID consulted who did not believe pt has an active UTI that needs abx treatment.  "Because of recurrent use of antibiotics pt has now developed a multidrug-resistant organism which has been present for the past year."  ID recommended urology followup and cystoscopy to evaluate for the presence of kidney stone or bladder diverticulum to assess the etiology of chronic bacterial colonization.    Pt had actually canceled a scheduled cystoscopy with Dr. Bernardo Heater to come to be admitted for UTI treatment.  Discussed with Dr. Bernardo Heater prior to discharge, who said pt can follow up with him as outpatient and have cystoscopy done as  outpatient.  ID recommended that "Before cystoscopy he can be treated for the E. coli in the urine with a the IV ertapenem followed by oral fosfomycin" to prevent spread.   Abnormal findings on diagnostic imaging of gallbladder CT reported "There is mild fat stranding about  the gallbladder. There is a new  small focus of gas within the gallbladder."  US abdomen showed "No definite cholelithiasis or other evidence of acute cholecystitis."  Pt had no abdominal pain.  Hx of Diabetes mellitus, type II, not currently active A1c wnl now and for the past year.  No need for BG checks or SSI  Hypertension BP BP varied, sometimes low in 90's.  Home propanolol held due to low BP, and continued to be held at discharge pending outpatient followup.  Chronic diastolic CHF (congestive heart failure) (HCC) Stable.    BPH (benign prostatic hyperplasia) continued home Proscar and Flomax  Anxiety  continue homed Klonopin as TID PRN   Discharge Diagnoses:  Principal Problem:   Generalized weakness Active Problems:   Diabetes mellitus, type II (HCC)   Hypertension   Chronic diastolic CHF (congestive heart failure) (HCC)   RLL pneumonia   Recurrent UTI   Abnormal findings on diagnostic imaging of gallbladder   BPH (benign prostatic hyperplasia)   History of ESBL E. coli infection    Discharge Instructions:  Allergies as of 02/27/2020   No Known Allergies     Medication List    STOP taking these medications   diazepam 2 MG tablet Commonly known as: VALIUM     TAKE these medications   allopurinol 300 MG tablet Commonly known as: ZYLOPRIM Take 300 mg by mouth daily with lunch.   aspirin EC 81 MG tablet Take 81 mg by mouth daily. Swallow whole.   B-12 2500 MCG Tabs Take 2,500 mcg by mouth daily.   brimonidine 0.2 % ophthalmic solution Commonly known as: ALPHAGAN Place 1 drop into the right eye 2 (two) times daily.   clonazePAM 0.5 MG tablet Commonly known as: KLONOPIN Take 0.25 in the morning, 0.25 at lunch, 0.5 mg at bedtime   Cranberry 250 MG Tabs Take 250 mg by mouth daily.   cyclobenzaprine 10 MG tablet Commonly known as: FLEXERIL Take 10 mg by mouth 2 (two) times daily.   ferrous sulfate 325 (65 FE) MG tablet Take 1 tablet  (325 mg total) by mouth every other day.   finasteride 5 MG tablet Commonly known as: PROSCAR Take 5 mg by mouth daily with lunch.   furosemide 20 MG tablet Commonly known as: Lasix Take 1 tablet (20 mg total) by mouth daily.   gabapentin 300 MG capsule Commonly known as: NEURONTIN Take 300 mg by mouth 2 (two) times daily.   latanoprost 0.005 % ophthalmic solution Commonly known as: XALATAN Place 1 drop into the right eye at bedtime.   loratadine 10 MG tablet Commonly known as: CLARITIN Take 10 mg by mouth daily as needed for allergies.   mirtazapine 45 MG tablet Commonly known as: REMERON Take 45 mg by mouth at bedtime.   nystatin powder Commonly known as: MYCOSTATIN/NYSTOP Apply topically 2 (two) times daily. Apply to abdominal folds /chest wall crease   omeprazole 40 MG capsule Commonly known as: PRILOSEC Take 40 mg by mouth daily with lunch.   primidone 50 MG tablet Commonly known as: MYSOLINE Take 50-75 mg by mouth See admin instructions. Take 1 tablets (75mg ) by mouth every morning and take 1 tablet (50mg ) by mouth every afternoon  propranolol 40 MG tablet Commonly known as: INDERAL Hold this medication until outpatient doctor's followup because your blood pressure has been low normal during your hospitalization. What changed:   how much to take  how to take this  when to take this  additional instructions   simvastatin 20 MG tablet Commonly known as: ZOCOR Take 1 tablet by mouth every evening.   tamsulosin 0.4 MG Caps capsule Commonly known as: FLOMAX Take 0.4 mg by mouth at bedtime.   Vitamin D 50 MCG (2000 UT) Caps Take 2,000 Units by mouth daily.   zolpidem 5 MG tablet Commonly known as: AMBIEN Take 1 tablet (5 mg total) by mouth at bedtime as needed for sleep. What changed:   when to take this  reasons to take this        Follow-up Information    [name]. Schedule an appointment as soon as possible for a visit in 1  week(s).   Specialty: Family Medicine Contact information: [address] [place] [address] [phone]        Your outpatient urologist Follow up.               No Known Allergies   The results of significant diagnostics from this hospitalization (including imaging, microbiology, ancillary and laboratory) are listed below for reference.   Consultations:   Procedures/Studies: DG Chest 2 View  Result Date: 02/25/2020 CLINICAL DATA:  Fatigue. EXAM: CHEST - 2 VIEW COMPARISON:  February 05, 2020. FINDINGS: Stable cardiomediastinal silhouette. No pneumothorax or pleural effusion is noted. Left lung is clear. Mild right basilar atelectasis or infiltrate is noted. Bony thorax is unremarkable. IMPRESSION: Mild right basilar subsegmental atelectasis or infiltrate. Electronically Signed   By: Marijo Conception M.D.   On: 02/25/2020 16:10   DG Chest 2 View  Result Date: 02/05/2020 CLINICAL DATA:  Weakness, fatigue, urinary tract infection EXAM: CHEST - 2 VIEW COMPARISON:  01/09/2020 FINDINGS: Lung volumes are small, but are stable since prior examination. Unchanged mild elevation of the right hemidiaphragm. Lungs are clear. No pneumothorax or pleural effusion. Cardiac size is within normal limits. The thoracic aorta appears ectatic, unchanged from prior examination. No acute bone abnormality. IMPRESSION: No active cardiopulmonary disease. Electronically Signed   By: Fidela Salisbury MD   On: 02/05/2020 18:58   CT ABDOMEN PELVIS W CONTRAST  Result Date: 02/25/2020 CLINICAL DATA:  Global weakness and fatigue peer concern for urinary tract infection. EXAM: CT ABDOMEN AND PELVIS WITH CONTRAST TECHNIQUE: Multidetector CT imaging of the abdomen and pelvis was performed using the standard protocol following bolus administration of intravenous contrast. CONTRAST:  159mL OMNIPAQUE IOHEXOL 300 MG/ML  SOLN COMPARISON:  CT dated February 09, 2020 FINDINGS: Lower chest: There is a new airspace opacity at  the right lung base concerning for pneumonia.The heart size is mildly enlarged. The main pulmonary artery is dilated measuring approximately 4.2 cm. There are coronary artery calcifications. Hepatobiliary: There is a stable cyst within the right hepatic lobe. There is mild fat stranding about the gallbladder. There is a new small focus of gas within the gallbladder (axial series 2, image 31).There is no biliary ductal dilation. Pancreas: Normal contours without ductal dilatation. No peripancreatic fluid collection. Spleen: Unremarkable. Adrenals/Urinary Tract: --Adrenal glands: There is a stable right adrenal adenoma. --Right kidney/ureter: Again noted is a large nonobstructing stone in the right kidney measuring approximately 3.2 cm. There is no right-sided hydronephrosis. --Left kidney/ureter: No hydronephrosis or radiopaque kidney stones. --Urinary bladder: There is a  probable posterior bladder diverticulum on the right. The bladder is otherwise unremarkable. Stomach/Bowel: --Stomach/Duodenum: The patient is status post prior gastric bypass. --Small bowel: Unremarkable. --Colon: There is a large amount of stool in the colon. --Appendix: Normal. Vascular/Lymphatic: Atherosclerotic calcification is present within the non-aneurysmal abdominal aorta, without hemodynamically significant stenosis. There is a well-positioned non retrievable IVC filter in place. --No retroperitoneal lymphadenopathy. --No mesenteric lymphadenopathy. --No pelvic or inguinal lymphadenopathy. Reproductive: Unremarkable Other: There is a small amount of free fluid in the patient's pelvis. There is no free air. Musculoskeletal. Advanced multilevel degenerative changes are noted throughout the visualized thoracolumbar spine. IMPRESSION: 1. New airspace opacity at the right lung base concerning for pneumonia. 2. There is mild fat stranding about the gallbladder. There is a new small focus of gas within the gallbladder. This could be secondary to  recent instrumentation or infection. Other differential considerations include peptic ulcer disease correlation with laboratory studies is recommended. If there is clinical concern for acute cholecystitis, follow-up with ultrasound is recommended. 3. Large nonobstructing stone in the right kidney, similar to prior study. 4. Stable right adrenal adenoma. 5. Large amount of stool in the colon. Aortic Atherosclerosis (ICD10-I70.0). Electronically Signed   By: Constance Holster M.D.   On: 02/25/2020 19:54   CT HEMATURIA WORKUP  Result Date: 02/09/2020 CLINICAL DATA:  Recurrent urinary tract infections. EXAM: CT ABDOMEN AND PELVIS WITHOUT AND WITH CONTRAST TECHNIQUE: Multidetector CT imaging of the abdomen and pelvis was performed following the standard protocol before and following the bolus administration of intravenous contrast. CONTRAST:  189mL OMNIPAQUE IOHEXOL 300 MG/ML  SOLN COMPARISON:  None. FINDINGS: Lower chest: Minimal dependent volume loss bilaterally. Coronary artery calcification. Heart is at the upper limits of normal in size to mildly enlarged. No pericardial or pleural effusion. Distal esophagus is unremarkable. Hepatobiliary: 2.6 cm well-circumscribed low-attenuation lesion in segment 4 of the liver is likely a cyst. Liver and gallbladder are otherwise unremarkable. No biliary ductal dilatation. Pancreas: Negative. Spleen: Negative. Adrenals/Urinary Tract: Fluid density nodule in the right adrenal gland measures 2.2 cm. Left adrenal gland is unremarkable. Right renal stones measure up to approximately 2.4 x 3.4 cm. Nonenhancing cysts in the right kidney measure up to 3.4 cm. Kidneys are otherwise unremarkable. Distal ureters are poorly opacified, limiting evaluation. Otherwise, no filling defects in the opacified portions of the kidneys, ureters and bladder. Small right posterolateral bladder diverticulum. Stomach/Bowel: Gastric bypass and hiatal hernia repair. Stomach, small bowel and appendix  are otherwise unremarkable. Fair amount of stool is seen in the colon, indicative of constipation. Vascular/Lymphatic: IVC filter is in place. Atherosclerotic calcification of the aorta without aneurysm. No pathologically enlarged lymph nodes. Reproductive: Prostate is visualized. Other: No free fluid. Elevated right hemidiaphragm. Locules of air along the ventral abdominal wall indicate subcutaneous injections. Mesenteries and peritoneum are unremarkable. Musculoskeletal: Degenerative changes in the spine and hips. Flowing anterior osteophytosis in the thoracic spine. IMPRESSION: 1. Right renal stones. Distal ureters are poorly opacified, limiting evaluation. No additional findings to explain the patient's symptoms. 2. Right adrenal adenoma. 3. Aortic atherosclerosis (ICD10-I70.0). Coronary artery calcification. Electronically Signed   By: Lorin Picket M.D.   On: 02/09/2020 10:14   US Abdomen Limited RUQ  Result Date: 02/25/2020 CLINICAL DATA:  Gallbladder disease.  Abnormal CT EXAM: ULTRASOUND ABDOMEN LIMITED RIGHT UPPER QUADRANT COMPARISON:  CT abdomen pelvis 02/25/2020 FINDINGS: Gallbladder: Limited visualization due to poor acoustic window. There is no gallbladder wall thickening or pericholecystic fluid. No definite stones visualized within the gallbladder. Common  bile duct: Diameter: 4 mm Liver: Increased hepatic echogenicity. There is a cyst in the right hepatic lobe measures 2.5 x 2.5 x 2.0 cm. Portal vein is patent on color Doppler imaging with normal direction of blood flow towards the liver. Other: None. IMPRESSION: Limited visualization of the gallbladder due to poor acoustic window. No definite cholelithiasis or other evidence of acute cholecystitis. Electronically Signed   By: Ulyses Jarred M.D.   On: 02/25/2020 21:14      Labs: BNP (last 3 results) No results for input(s): BNP in the last 8760 hours. Basic Metabolic Panel: Recent Labs  Lab 02/25/20 1541 02/27/20 0456  NA 140 142    K 3.9 4.2  CL 99 101  CO2 33* 35*  GLUCOSE 85 90  BUN 28* 17  CREATININE 0.76 0.53*  CALCIUM 8.4* 8.0*  MG 1.9 1.9   Liver Function Tests: Recent Labs  Lab 02/25/20 1541  AST 13*  ALT 11  ALKPHOS 69  BILITOT 0.7  PROT 6.3*  ALBUMIN 2.9*   No results for input(s): LIPASE, AMYLASE in the last 168 hours. No results for input(s): AMMONIA in the last 168 hours. CBC: Recent Labs  Lab 02/25/20 1541 02/27/20 0456  WBC 10.9* 5.2  NEUTROABS 7.6  --   HGB 14.0 12.7*  HCT 45.1 40.4  MCV 98.0 97.8  PLT 190 147*   Cardiac Enzymes: No results for input(s): CKTOTAL, CKMB, CKMBINDEX, TROPONINI in the last 168 hours. BNP: Invalid input(s): POCBNP CBG: Recent Labs  Lab 02/26/20 0743  GLUCAP 81   D-Dimer No results for input(s): DDIMER in the last 72 hours. Hgb A1c Recent Labs    02/26/20 0421  HGBA1C 5.1   Lipid Profile No results for input(s): CHOL, HDL, LDLCALC, TRIG, CHOLHDL, LDLDIRECT in the last 72 hours. Thyroid function studies Recent Labs    02/25/20 1541  TSH 3.506   Anemia work up No results for input(s): VITAMINB12, FOLATE, FERRITIN, TIBC, IRON, RETICCTPCT in the last 72 hours. Urinalysis    Component Value Date/Time   COLORURINE AMBER (A) 02/25/2020 1823   APPEARANCEUR CLOUDY (A) 02/25/2020 1823   APPEARANCEUR Cloudy (A) 01/16/2020 1125   LABSPEC 1.028 02/25/2020 1823   PHURINE 5.0 02/25/2020 1823   GLUCOSEU NEGATIVE 02/25/2020 1823   HGBUR SMALL (A) 02/25/2020 1823   BILIRUBINUR SMALL (A) 02/25/2020 1823   BILIRUBINUR Negative 01/16/2020 1125   KETONESUR NEGATIVE 02/25/2020 1823   PROTEINUR NEGATIVE 02/25/2020 1823   NITRITE NEGATIVE 02/25/2020 1823   LEUKOCYTESUR SMALL (A) 02/25/2020 1823   Sepsis Labs Invalid input(s): PROCALCITONIN,  WBC,  LACTICIDVEN Microbiology Recent Results (from the past 240 hour(s))  SARS Coronavirus 2 by RT PCR (hospital order, performed in [place] hospital lab) Nasopharyngeal     Status: None   Collection  Time: 02/25/20  6:05 PM   Specimen: Nasopharyngeal  Result Value Ref Range Status   SARS Coronavirus 2 NEGATIVE NEGATIVE Final    Comment: (NOTE) SARS-CoV-2 target nucleic acids are NOT DETECTED.  The SARS-CoV-2 RNA is generally detectable in upper and lower respiratory specimens during the acute phase of infection. The lowest concentration of SARS-CoV-2 viral copies this assay can detect is 250 copies / mL. A negative result does not preclude SARS-CoV-2 infection and should not be used as the sole basis for treatment or other patient management decisions.  A negative result may occur with improper specimen collection / handling, submission of specimen other than nasopharyngeal swab, presence of viral mutation(s) within the areas targeted by  this assay, and inadequate number of viral copies (<250 copies / mL). A negative result must be combined with clinical observations, patient history, and epidemiological information.  Fact Sheet for Patients:   StrictlyIdeas.no  Fact Sheet for Healthcare Providers: BankingDealers.co.za  This test is not yet approved or  cleared by the [place] FDA and has been authorized for detection and/or diagnosis of SARS-CoV-2 by FDA under an Emergency Use Authorization (EUA).  This EUA will remain in effect (meaning this test can be used) for the duration of the COVID-19 declaration under Section 564(b)(1) of the Act, 21 U.S.C. section 360bbb-3(b)(1), unless the authorization is terminated or revoked sooner.  Performed at [facility], [address] [place], [place] [address]   Blood culture (routine x 2)     Status: None (Preliminary result)   Collection Time: 02/25/20  9:15 PM   Specimen: BLOOD  Result Value Ref Range Status   Specimen Description BLOOD BLOOD LEFT FOREARM  Final   Special Requests   Final    BOTTLES DRAWN AEROBIC AND ANAEROBIC Blood Culture results may not be optimal  due to an inadequate volume of blood received in culture bottles   Culture   Final    NO GROWTH 2 DAYS Performed at [facility], [address] [place], [place] [address]    Report Status PENDING  Incomplete  Blood culture (routine x 2)     Status: None (Preliminary result)   Collection Time: 02/25/20  9:15 PM   Specimen: BLOOD  Result Value Ref Range Status   Specimen Description BLOOD BLOOD RIGHT FOREARM  Final   Special Requests   Final    BOTTLES DRAWN AEROBIC AND ANAEROBIC Blood Culture adequate volume   Culture   Final    NO GROWTH 2 DAYS Performed at [facility], [address] [place], [place], [place] [address]    Report Status PENDING  Incomplete     Total time spend on discharging this patient, including the last patient exam, discussing the hospital stay, instructions for ongoing care as it relates to all pertinent caregivers, as well as preparing the medical discharge records, prescriptions, and/or referrals as applicable, is 50 minutes.    Enzo Bi, MD  [facility] 02/27/2020, 9:30 AM  If 7PM-7AM, please contact night-coverage

## 2020-02-29 LAB — CULTURE, RESPIRATORY W GRAM STAIN
Culture: NORMAL
Special Requests: NORMAL

## 2020-03-01 LAB — CULTURE, BLOOD (ROUTINE X 2)
Culture: NO GROWTH
Culture: NO GROWTH
Special Requests: ADEQUATE

## 2020-03-04 ENCOUNTER — Telehealth: Payer: Self-pay | Admitting: Urology

## 2020-03-04 DIAGNOSIS — R8271 Bacteriuria: Secondary | ICD-10-CM

## 2020-03-04 NOTE — Telephone Encounter (Signed)
[name] LMOM about coming in for a cysto appt and needs 3 days of abx prior.  I can schedule cysto, but wasn't sure about abx.  Please advise.  Wife is [name] [phone]

## 2020-03-05 NOTE — Telephone Encounter (Signed)
Daughter called would like to proceed with plan, also would like Valium prior to procedure sent. Ok to place referral to Infectious disease?

## 2020-03-05 NOTE — Telephone Encounter (Signed)
Infectious disease had recommended a dose of IV ertapenem prior to cystoscopy followed by oral fosfomycin.  Since we do not have the capacity for IV antibiotics in clinic may need to coordinate this with infectious disease

## 2020-03-07 MED ORDER — DIAZEPAM 5 MG PO TABS
ORAL_TABLET | ORAL | 0 refills | Status: DC
Start: 2020-03-07 — End: 2020-03-16

## 2020-03-07 NOTE — Telephone Encounter (Signed)
Rx Valium sent

## 2020-03-08 NOTE — Telephone Encounter (Signed)
Referral will need to be placed

## 2020-03-10 NOTE — Telephone Encounter (Signed)
Spoke with patient and notified him that ID would be getting in contact to schedule IV placement and Infusion. Cysto was scheduled and may be changed if needed to work with ID. Patient verbalized understanding

## 2020-03-16 ENCOUNTER — Encounter: Payer: Self-pay | Admitting: Internal Medicine

## 2020-03-16 ENCOUNTER — Encounter: Payer: Self-pay | Admitting: Infectious Diseases

## 2020-03-16 ENCOUNTER — Other Ambulatory Visit: Payer: Self-pay

## 2020-03-16 ENCOUNTER — Inpatient Hospital Stay: Payer: Medicare Other

## 2020-03-16 ENCOUNTER — Emergency Department: Payer: Medicare Other

## 2020-03-16 ENCOUNTER — Inpatient Hospital Stay
Admission: EM | Admit: 2020-03-16 | Discharge: 2020-03-24 | DRG: 871 | Disposition: E | Payer: Medicare Other | Attending: Internal Medicine | Admitting: Internal Medicine

## 2020-03-16 ENCOUNTER — Ambulatory Visit: Payer: Medicare Other | Attending: Infectious Diseases | Admitting: Infectious Diseases

## 2020-03-16 VITALS — BP 112/73 | HR 61 | Temp 97.9°F | Resp 16 | Ht 72.0 in | Wt 288.0 lb

## 2020-03-16 DIAGNOSIS — R569 Unspecified convulsions: Secondary | ICD-10-CM | POA: Diagnosis present

## 2020-03-16 DIAGNOSIS — E119 Type 2 diabetes mellitus without complications: Secondary | ICD-10-CM | POA: Diagnosis present

## 2020-03-16 DIAGNOSIS — I272 Pulmonary hypertension, unspecified: Secondary | ICD-10-CM | POA: Diagnosis present

## 2020-03-16 DIAGNOSIS — E662 Morbid (severe) obesity with alveolar hypoventilation: Secondary | ICD-10-CM | POA: Diagnosis present

## 2020-03-16 DIAGNOSIS — R0902 Hypoxemia: Secondary | ICD-10-CM | POA: Insufficient documentation

## 2020-03-16 DIAGNOSIS — Z515 Encounter for palliative care: Secondary | ICD-10-CM | POA: Diagnosis not present

## 2020-03-16 DIAGNOSIS — Z66 Do not resuscitate: Secondary | ICD-10-CM | POA: Diagnosis present

## 2020-03-16 DIAGNOSIS — Z6841 Body Mass Index (BMI) 40.0 and over, adult: Secondary | ICD-10-CM

## 2020-03-16 DIAGNOSIS — I5031 Acute diastolic (congestive) heart failure: Secondary | ICD-10-CM | POA: Diagnosis not present

## 2020-03-16 DIAGNOSIS — R4182 Altered mental status, unspecified: Secondary | ICD-10-CM

## 2020-03-16 DIAGNOSIS — Z791 Long term (current) use of non-steroidal anti-inflammatories (NSAID): Secondary | ICD-10-CM | POA: Diagnosis not present

## 2020-03-16 DIAGNOSIS — Z79899 Other long term (current) drug therapy: Secondary | ICD-10-CM | POA: Insufficient documentation

## 2020-03-16 DIAGNOSIS — R531 Weakness: Secondary | ICD-10-CM | POA: Insufficient documentation

## 2020-03-16 DIAGNOSIS — R5383 Other fatigue: Secondary | ICD-10-CM | POA: Diagnosis present

## 2020-03-16 DIAGNOSIS — I5033 Acute on chronic diastolic (congestive) heart failure: Secondary | ICD-10-CM | POA: Diagnosis present

## 2020-03-16 DIAGNOSIS — I248 Other forms of acute ischemic heart disease: Secondary | ICD-10-CM | POA: Diagnosis present

## 2020-03-16 DIAGNOSIS — A419 Sepsis, unspecified organism: Principal | ICD-10-CM | POA: Diagnosis present

## 2020-03-16 DIAGNOSIS — R6521 Severe sepsis with septic shock: Secondary | ICD-10-CM | POA: Diagnosis present

## 2020-03-16 DIAGNOSIS — N390 Urinary tract infection, site not specified: Secondary | ICD-10-CM | POA: Diagnosis present

## 2020-03-16 DIAGNOSIS — J189 Pneumonia, unspecified organism: Secondary | ICD-10-CM | POA: Diagnosis present

## 2020-03-16 DIAGNOSIS — J9621 Acute and chronic respiratory failure with hypoxia: Secondary | ICD-10-CM | POA: Diagnosis present

## 2020-03-16 DIAGNOSIS — E869 Volume depletion, unspecified: Secondary | ICD-10-CM | POA: Diagnosis present

## 2020-03-16 DIAGNOSIS — R0689 Other abnormalities of breathing: Secondary | ICD-10-CM

## 2020-03-16 DIAGNOSIS — E873 Alkalosis: Secondary | ICD-10-CM | POA: Diagnosis not present

## 2020-03-16 DIAGNOSIS — Z1612 Extended spectrum beta lactamase (ESBL) resistance: Secondary | ICD-10-CM | POA: Diagnosis present

## 2020-03-16 DIAGNOSIS — I110 Hypertensive heart disease with heart failure: Secondary | ICD-10-CM | POA: Diagnosis present

## 2020-03-16 DIAGNOSIS — Z7982 Long term (current) use of aspirin: Secondary | ICD-10-CM | POA: Insufficient documentation

## 2020-03-16 DIAGNOSIS — J9811 Atelectasis: Secondary | ICD-10-CM | POA: Diagnosis present

## 2020-03-16 DIAGNOSIS — Z9884 Bariatric surgery status: Secondary | ICD-10-CM | POA: Diagnosis not present

## 2020-03-16 DIAGNOSIS — Z96651 Presence of right artificial knee joint: Secondary | ICD-10-CM | POA: Diagnosis present

## 2020-03-16 DIAGNOSIS — Z20822 Contact with and (suspected) exposure to covid-19: Secondary | ICD-10-CM | POA: Diagnosis present

## 2020-03-16 DIAGNOSIS — J9622 Acute and chronic respiratory failure with hypercapnia: Secondary | ICD-10-CM | POA: Diagnosis present

## 2020-03-16 DIAGNOSIS — G92 Toxic encephalopathy: Secondary | ICD-10-CM | POA: Diagnosis present

## 2020-03-16 DIAGNOSIS — Z2239 Carrier of other specified bacterial diseases: Secondary | ICD-10-CM

## 2020-03-16 DIAGNOSIS — R258 Other abnormal involuntary movements: Secondary | ICD-10-CM

## 2020-03-16 LAB — URINALYSIS, COMPLETE (UACMP) WITH MICROSCOPIC
Bilirubin Urine: NEGATIVE
Glucose, UA: NEGATIVE mg/dL
Hgb urine dipstick: NEGATIVE
Ketones, ur: NEGATIVE mg/dL
Nitrite: NEGATIVE
Protein, ur: 30 mg/dL — AB
Specific Gravity, Urine: 1.025 (ref 1.005–1.030)
pH: 5 (ref 5.0–8.0)

## 2020-03-16 LAB — BLOOD GAS, ARTERIAL
Acid-Base Excess: 7.5 mmol/L — ABNORMAL HIGH (ref 0.0–2.0)
Acid-Base Excess: 8.6 mmol/L — ABNORMAL HIGH (ref 0.0–2.0)
Bicarbonate: 41.2 mmol/L — ABNORMAL HIGH (ref 20.0–28.0)
Bicarbonate: 33.5 mmol/L — ABNORMAL HIGH (ref 20.0–28.0)
FIO2: 0.28
FIO2: 0.5
MECHVT: 500 mL
Mechanical Rate: 26
O2 Saturation: 97.9 %
O2 Saturation: 99.3 %
PEEP: 5 cmH2O
Patient temperature: 37
Patient temperature: 37
pCO2 arterial: >120 mmHg (ref 32.0–48.0)
pCO2 arterial: 46 mmHg (ref 32.0–48.0)
pH, Arterial: 7.13 — CL (ref 7.350–7.450)
pH, Arterial: 7.47 — ABNORMAL HIGH (ref 7.350–7.450)
pO2, Arterial: 129 mmHg — ABNORMAL HIGH (ref 83.0–108.0)
pO2, Arterial: 143 mmHg — ABNORMAL HIGH (ref 83.0–108.0)

## 2020-03-16 LAB — BASIC METABOLIC PANEL
Anion gap: 9 (ref 5–15)
BUN: 27 mg/dL — ABNORMAL HIGH (ref 8–23)
CO2: 35 mmol/L — ABNORMAL HIGH (ref 22–32)
Calcium: 8.3 mg/dL — ABNORMAL LOW (ref 8.9–10.3)
Chloride: 103 mmol/L (ref 98–111)
Creatinine, Ser: 0.69 mg/dL (ref 0.61–1.24)
GFR calc Af Amer: >60 mL/min (ref >60)
GFR calc non Af Amer: >60 mL/min (ref >60)
Glucose, Bld: 104 mg/dL — ABNORMAL HIGH (ref 70–99)
Potassium: 3.9 mmol/L (ref 3.5–5.1)
Sodium: 147 mmol/L — ABNORMAL HIGH (ref 135–145)

## 2020-03-16 LAB — SARS CORONAVIRUS 2 BY RT PCR (HOSPITAL ORDER, PERFORMED IN ~~LOC~~ HOSPITAL LAB): SARS Coronavirus 2: NEGATIVE

## 2020-03-16 LAB — LACTIC ACID, PLASMA: Lactic Acid, Venous: 0.9 mmol/L (ref 0.5–1.9)

## 2020-03-16 LAB — CBC
HCT: 45.5 % (ref 39.0–52.0)
Hemoglobin: 13.5 g/dL (ref 13.0–17.0)
MCH: 30.2 pg (ref 26.0–34.0)
MCHC: 29.7 g/dL — ABNORMAL LOW (ref 30.0–36.0)
MCV: 101.8 fL — ABNORMAL HIGH (ref 80.0–100.0)
Platelets: 137 10*3/uL — ABNORMAL LOW (ref 150–400)
RBC: 4.47 MIL/uL (ref 4.22–5.81)
RDW: 14.5 % (ref 11.5–15.5)
WBC: 5.9 10*3/uL (ref 4.0–10.5)
nRBC: 0 % (ref 0.0–0.2)

## 2020-03-16 LAB — TSH: TSH: 3.754 u[IU]/mL (ref 0.350–4.500)

## 2020-03-16 LAB — PROCALCITONIN: Procalcitonin: <0.1 ng/mL

## 2020-03-16 LAB — TROPONIN I (HIGH SENSITIVITY): Troponin I (High Sensitivity): 36 ng/L — ABNORMAL HIGH (ref <18)

## 2020-03-16 LAB — AMMONIA: Ammonia: 48 umol/L — ABNORMAL HIGH (ref 9–35)

## 2020-03-16 MED ORDER — TOPIRAMATE 25 MG PO TABS: 100.0000 mg | ORAL_TABLET | Freq: Every day | ORAL | Status: DC

## 2020-03-16 MED ORDER — IPRATROPIUM-ALBUTEROL 0.5-2.5 (3) MG/3ML IN SOLN: 3.0000 mL | Freq: Four times a day (QID) | RESPIRATORY_TRACT | Status: DC | PRN

## 2020-03-16 MED ORDER — LATANOPROST 0.005 % OP SOLN
1.0000 [drp] | Freq: Every day | OPHTHALMIC | Status: DC
  Administered 2020-03-17: 1 [drp] via OPHTHALMIC
  Filled 2020-03-16 (×2): qty 2.5

## 2020-03-16 MED ORDER — FENTANYL 2500MCG IN NS 250ML (10MCG/ML) PREMIX INFUSION
INTRAVENOUS | Status: AC
Start: 2020-03-16 — End: 2020-03-17
  Filled 2020-03-16: qty 250

## 2020-03-16 MED ORDER — FAMOTIDINE IN NACL 20-0.9 MG/50ML-% IV SOLN
20.0000 mg | Freq: Two times a day (BID) | INTRAVENOUS | Status: DC
  Administered 2020-03-17 – 2020-03-18 (×4): 20 mg via INTRAVENOUS
  Filled 2020-03-16 (×4): qty 50

## 2020-03-16 MED ORDER — FERROUS SULFATE 325 (65 FE) MG PO TABS
325.0000 mg | ORAL_TABLET | ORAL | Status: DC
  Administered 2020-03-17: 325 mg via ORAL
  Filled 2020-03-16 (×3): qty 1

## 2020-03-16 MED ORDER — NOREPINEPHRINE 4 MG/250ML-% IV SOLN
2.0000 ug/min | INTRAVENOUS | Status: DC
  Administered 2020-03-16: 10 ug/min via INTRAVENOUS
  Administered 2020-03-17: 8 ug/min via INTRAVENOUS
  Administered 2020-03-17: 7 ug/min via INTRAVENOUS
  Administered 2020-03-18: 5 ug/min via INTRAVENOUS
  Filled 2020-03-16 (×4): qty 250

## 2020-03-16 MED ORDER — FENTANYL BOLUS VIA INFUSION
25.0000 ug | INTRAVENOUS | Status: DC | PRN
  Administered 2020-03-16 – 2020-03-17 (×3): 25 ug via INTRAVENOUS
  Filled 2020-03-16: qty 25

## 2020-03-16 MED ORDER — TOPIRAMATE 25 MG PO TABS
50.0000 mg | ORAL_TABLET | Freq: Every day | ORAL | Status: DC
  Administered 2020-03-17: 50 mg via ORAL
  Filled 2020-03-16 (×2): qty 2

## 2020-03-16 MED ORDER — POLYETHYLENE GLYCOL 3350 17 G PO PACK: 17.0000 g | PACK | Freq: Every day | ORAL | Status: DC | PRN

## 2020-03-16 MED ORDER — FENTANYL BOLUS VIA INFUSION
50.0000 ug | Freq: Once | INTRAVENOUS | Status: AC
  Administered 2020-03-16: 50 ug via INTRAVENOUS
  Filled 2020-03-16: qty 50

## 2020-03-16 MED ORDER — ROCURONIUM BROMIDE 50 MG/5ML IV SOLN
INTRAVENOUS | Status: DC | PRN
  Administered 2020-03-16: 30 mg via INTRAVENOUS

## 2020-03-16 MED ORDER — KETAMINE HCL 10 MG/ML IJ SOLN
0.5000 mg/kg | Freq: Once | INTRAMUSCULAR | Status: AC
  Administered 2020-03-16: 59 mg via INTRAVENOUS

## 2020-03-16 MED ORDER — FENTANYL CITRATE (PF) 100 MCG/2ML IJ SOLN: 25.0000 ug | Freq: Once | INTRAMUSCULAR | Status: DC

## 2020-03-16 MED ORDER — DOCUSATE SODIUM 50 MG/5ML PO LIQD
100.0000 mg | Freq: Two times a day (BID) | ORAL | Status: DC
  Administered 2020-03-17 – 2020-03-18 (×3): 100 mg
  Filled 2020-03-16 (×5): qty 10

## 2020-03-16 MED ORDER — BRIMONIDINE TARTRATE 0.2 % OP SOLN
1.0000 [drp] | Freq: Three times a day (TID) | OPHTHALMIC | Status: DC
  Administered 2020-03-17 – 2020-03-18 (×3): 1 [drp] via OPHTHALMIC
  Filled 2020-03-16 (×2): qty 5

## 2020-03-16 MED ORDER — PROPOFOL 1000 MG/100ML IV EMUL
INTRAVENOUS | Status: AC
  Administered 2020-03-16: 20 ug/kg/min via INTRAVENOUS
  Filled 2020-03-16: qty 100

## 2020-03-16 MED ORDER — ETOMIDATE 2 MG/ML IV SOLN
INTRAVENOUS | Status: DC | PRN
  Administered 2020-03-16: 30 mg via INTRAVENOUS

## 2020-03-16 MED ORDER — SODIUM CHLORIDE 0.9 % IV SOLN
1.0000 g | Freq: Once | INTRAVENOUS | Status: AC
  Administered 2020-03-16: 1 g via INTRAVENOUS
  Filled 2020-03-16: qty 10

## 2020-03-16 MED ORDER — ENOXAPARIN SODIUM 40 MG/0.4ML ~~LOC~~ SOLN
40.0000 mg | Freq: Two times a day (BID) | SUBCUTANEOUS | Status: DC
  Administered 2020-03-17 (×2): 40 mg via SUBCUTANEOUS
  Filled 2020-03-16 (×2): qty 0.4

## 2020-03-16 MED ORDER — MIDAZOLAM HCL 2 MG/2ML IJ SOLN
1.0000 mg | INTRAMUSCULAR | Status: AC | PRN
  Administered 2020-03-16 (×3): 1 mg via INTRAVENOUS

## 2020-03-16 MED ORDER — MIDAZOLAM HCL 2 MG/2ML IJ SOLN
3.0000 mg | Freq: Once | INTRAMUSCULAR | Status: AC
  Administered 2020-03-16: 3 mg via INTRAVENOUS

## 2020-03-16 MED ORDER — MIDAZOLAM HCL 2 MG/2ML IJ SOLN: 1.0000 mg | INTRAMUSCULAR | Status: DC | PRN

## 2020-03-16 MED ORDER — SODIUM CHLORIDE 0.9 % IV SOLN
2.0000 g | Freq: Three times a day (TID) | INTRAVENOUS | Status: DC
  Administered 2020-03-17: 2 g via INTRAVENOUS
  Filled 2020-03-16 (×3): qty 2

## 2020-03-16 MED ORDER — PROPOFOL 1000 MG/100ML IV EMUL
5.0000 ug/kg/min | INTRAVENOUS | Status: DC
  Administered 2020-03-17: 30 ug/kg/min via INTRAVENOUS
  Administered 2020-03-17: 35 ug/kg/min via INTRAVENOUS
  Administered 2020-03-17: 30 ug/kg/min via INTRAVENOUS
  Administered 2020-03-17: 5 ug/kg/min via INTRAVENOUS
  Administered 2020-03-17: 35 ug/kg/min via INTRAVENOUS
  Administered 2020-03-18: 30 ug/kg/min via INTRAVENOUS
  Administered 2020-03-18 (×2): 25 ug/kg/min via INTRAVENOUS
  Filled 2020-03-16 (×8): qty 100

## 2020-03-16 MED ORDER — MIDAZOLAM HCL 2 MG/2ML IJ SOLN
1.0000 mg | INTRAMUSCULAR | Status: DC | PRN
  Administered 2020-03-16: 2 mg via INTRAVENOUS
  Administered 2020-03-16: 1 mg via INTRAVENOUS
  Administered 2020-03-17 (×2): 2 mg via INTRAVENOUS
  Filled 2020-03-16 (×2): qty 2

## 2020-03-16 MED ORDER — PRIMIDONE 250 MG PO TABS
375.0000 mg | ORAL_TABLET | Freq: Every morning | ORAL | Status: DC
  Administered 2020-03-17 – 2020-03-18 (×2): 375 mg
  Filled 2020-03-16 (×3): qty 2

## 2020-03-16 MED ORDER — MIDAZOLAM HCL 2 MG/2ML IJ SOLN
2.0000 mg | Freq: Once | INTRAMUSCULAR | Status: DC
  Filled 2020-03-16: qty 2

## 2020-03-16 MED ORDER — TOPIRAMATE 100 MG PO TABS
100.0000 mg | ORAL_TABLET | Freq: Every morning | ORAL | Status: DC
  Administered 2020-03-17 – 2020-03-18 (×2): 100 mg via ORAL
  Filled 2020-03-16 (×3): qty 1

## 2020-03-16 MED ORDER — SODIUM CHLORIDE 0.9 % IV BOLUS
1000.0000 mL | Freq: Once | INTRAVENOUS | Status: AC
  Administered 2020-03-16: 1000 mL via INTRAVENOUS

## 2020-03-16 MED ORDER — VANCOMYCIN HCL IN DEXTROSE 1-5 GM/200ML-% IV SOLN
1000.0000 mg | Freq: Once | INTRAVENOUS | Status: AC
  Administered 2020-03-16: 1000 mg via INTRAVENOUS
  Filled 2020-03-16: qty 200

## 2020-03-16 MED ORDER — FENTANYL 2500MCG IN NS 250ML (10MCG/ML) PREMIX INFUSION
25.0000 ug/h | INTRAVENOUS | Status: DC
  Administered 2020-03-16: 25 ug/h via INTRAVENOUS
  Administered 2020-03-17 – 2020-03-18 (×2): 200 ug/h via INTRAVENOUS
  Filled 2020-03-16 (×2): qty 250

## 2020-03-16 MED ORDER — PRIMIDONE 250 MG PO TABS
250.0000 mg | ORAL_TABLET | Freq: Every evening | ORAL | Status: DC
  Administered 2020-03-17: 250 mg
  Filled 2020-03-16 (×2): qty 1

## 2020-03-16 MED ORDER — POLYETHYLENE GLYCOL 3350 17 G PO PACK
17.0000 g | PACK | Freq: Every day | ORAL | Status: DC
  Administered 2020-03-17 – 2020-03-18 (×2): 17 g
  Filled 2020-03-16 (×2): qty 1

## 2020-03-16 MED ORDER — NYSTATIN 100000 UNIT/GM EX POWD
Freq: Two times a day (BID) | CUTANEOUS | Status: DC
  Filled 2020-03-16 (×2): qty 15

## 2020-03-16 MED ORDER — TOPIRAMATE 25 MG PO TABS: 50.0000 mg | ORAL_TABLET | Freq: Every evening | ORAL | Status: DC

## 2020-03-16 MED ORDER — MIDAZOLAM HCL 2 MG/2ML IJ SOLN
2.0000 mg | Freq: Once | INTRAMUSCULAR | Status: AC
  Administered 2020-03-16: 2 mg via INTRAVENOUS

## 2020-03-16 MED ORDER — SIMVASTATIN 20 MG PO TABS
20.0000 mg | ORAL_TABLET | Freq: Every evening | ORAL | Status: DC
  Administered 2020-03-17: 20 mg
  Filled 2020-03-16: qty 1

## 2020-03-16 NOTE — Progress Notes (Signed)
Pharmacy Antibiotic Note  Curtis Alexander is a 74 y.o. male admitted on 02/29/2020 with pneumonia.  Pharmacy has been consulted for cefepime dosing.  Plan: Will start patient on cefepime 2g IV q8h per CrCl > 60 ml/min and will continue to monitor and adjust doses per changes in renal function.  Height: 5\' 7"  (170.2 cm) Weight: 117.9 kg (260 lb) IBW/kg (Calculated) : 66.1  Temp (24hrs), Avg:98.4 F (36.9 C), Min:97.9 F (36.6 C), Max:98.9 F (37.2 C)  Recent Labs  Lab 03/14/2020 1310 02/26/2020 1852  WBC 5.9  --   CREATININE 0.69  --   LATICACIDVEN  --  0.9    Estimated Creatinine Clearance: 96.4 mL/min (by C-G formula based on SCr of 0.69 mg/dL).    No Known Allergies   Thank you for allowing pharmacy to be a part of this patient's care.  Tobie Lords, PharmD, BCPS Clinical Pharmacist 03/18/2020 10:04 PM

## 2020-03-16 NOTE — H&P (Signed)
Name: Curtis Alexander MRN: [id number] DOB: 1945/11/02    ADMISSION DATE:  03/23/2020 CONSULTATION DATE: 03/02/2020  REFERRING MD : Dr. Archie Balboa   CHIEF COMPLAINT: Hypoxia   BRIEF PATIENT DESCRIPTION:  74 yo male admitted with acute on chronic hypoxic hypercapnic respiratory failure secondary to possible pneumonia complicated by OSA requiring mechanical intubation   SIGNIFICANT EVENTS/STUDIES:  08/24: Pt admitted to ICU mechanically intubated  08/24: CT Head revealed no acute intracranial pathology. Moderate age-related atrophy and chronic microvascular ischemic changes.  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  He presented to [facility] ER on 08/24 from infectious disease office per MD recommendations with hypoxia and fatigue/ weakness.  Upon arrival to the ER O2 sats were in the 80's, therefore pt placed on 3L O2.  Pt also hypotensive with sbp 80-90's requiring 2L NS bolus. Lab results revealed Na+ 147, CO2 35, glucose 104, troponin 36, pct <0.10, lactic acid 0.9, ammonia 48, and platelets 137.  CXR concerning for pneumonia, pt received ceftriaxone and vancomycin.  ABG revealed pH 7.13/pCO2 >120/pO2 129/bicarb 41.2, therefore pt required mechanical intubation.  PCCM contacted for ICU admission.   PAST MEDICAL HISTORY :   has a past medical history of Arthritis, Cervical spinal stenosis, Diabetes mellitus without complication ([place]), Essential tremor, GERD (gastroesophageal reflux disease), History of hiatal hernia, History of kidney stones, Hypertension, Hypoglycemia, Non-traumatic rupture of right patellar tendon (03/25/2019), Sleep apnea, and Spinal stenosis,  lumbar.  has a past surgical history that includes Gastric bypass; Skin surgery; Fracture surgery (Left); Vena cava filter placement; Knee Arthroplasty (Right, 03/05/2019); and Patellar tendon repair (Right, 03/28/2019). Prior to Admission medications   Medication Sig Start Date End Date Taking? Authorizing Provider  allopurinol (ZYLOPRIM) 300 MG tablet Take 300 mg by mouth daily with lunch.   Yes [provider]  aspirin EC 81 MG tablet Take 81 mg by mouth daily. Swallow whole.   Yes [provider]  brimonidine (ALPHAGAN) 0.2 % ophthalmic solution Place 1 drop into the right eye 3 (three) times daily.    Yes [provider]  Cholecalciferol (VITAMIN D) 50 MCG (2000 UT) CAPS Take 2,000 Units by mouth daily.   Yes [provider]  Cholecalciferol 25 MCG (1000 UT) capsule Take 1,000 Units by mouth daily.   Yes [provider]  clonazePAM (KLONOPIN) 0.5 MG tablet Take 0.25 in the morning, 0.25 at lunch, 0.5 mg at bedtime 02/12/20  Yes Mariel Aloe, MD  Cranberry 250 MG TABS Take 250 mg by mouth daily.   Yes [provider]  cyclobenzaprine (FLEXERIL) 10 MG tablet Take 10 mg by mouth 3 (three) times daily.    Yes [provider]  ferrous sulfate 325 (65 FE) MG tablet Take 1 tablet (325 mg total) by mouth every other day. 02/27/20  Yes Enzo Bi, MD  finasteride (PROSCAR) 5 MG tablet Take 5 mg by mouth daily with lunch.   Yes [provider]  gabapentin (NEURONTIN) 300 MG capsule Take 600 mg by mouth 2 (two) times daily.  12/29/19  Yes [provider]  latanoprost (XALATAN) 0.005 % ophthalmic solution Place 1 drop into the right eye at bedtime.   Yes [provider]  loratadine (CLARITIN) 10 MG tablet Take 10 mg by mouth daily as needed for allergies.   Yes [provider]  meloxicam (MOBIC) 15 MG tablet Take 15 mg by mouth as needed. 03/12/20  Yes [provider]  mirtazapine (REMERON) 45 MG tablet  Take 45 mg by mouth at bedtime.    Yes [provider]  nystatin (MYCOSTATIN/NYSTOP) powder Apply topically 2 (two) times daily. Apply to abdominal folds /chest wall crease 01/11/20  Yes Rai, Ripudeep K, MD  omeprazole (PRILOSEC) 40 MG capsule Take 40 mg by mouth daily with lunch.   Yes [provider]  primidone (MYSOLINE) 250 MG tablet TAKE 1 & 1/2 TABLETS BY MOUTH IN THE MORNING AND 1 TABLET BY MOUTH IN THE AFTERNOON   Yes [provider]  propranolol (INDERAL) 40 MG tablet Hold this medication until outpatient doctor's followup because your blood pressure has been low normal during your hospitalization. Patient taking differently: 2 (two) times daily. Hold this medication until outpatient doctor's followup because your blood pressure has been low normal during your hospitalization. 02/27/20  Yes Enzo Bi, MD  simvastatin (ZOCOR) 20 MG tablet Take 1 tablet by mouth every evening.   Yes [provider]  tamsulosin (FLOMAX) 0.4 MG CAPS capsule Take 0.4 mg by mouth at bedtime.   Yes [provider]  topiramate (TOPAMAX) 50 MG tablet 50 mg. TAKE 2 TABLETS BY MOUTH AT 9 AM AND 1 TABLET BY MOUTH AT 3 PM.   Yes [provider]  vitamin B-12 (CYANOCOBALAMIN) 1000 MCG tablet Take 1,000 mcg by mouth daily.   Yes [provider]  zolpidem (AMBIEN) 5 MG tablet Take 1 tablet (5 mg total) by mouth at bedtime as needed for sleep. 02/27/20  Yes Enzo Bi, MD  furosemide (LASIX) 20 MG tablet Take 1 tablet (20 mg total) by mouth daily. 01/11/20 01/10/21  Rai, Vernelle Emerald, MD   No Known Allergies  FAMILY HISTORY:  family history is not on file. SOCIAL HISTORY:  reports that he has never smoked. He has never used smokeless tobacco. He reports current alcohol use. He reports that he does not use drugs.  REVIEW OF SYSTEMS:   Unable to assess pt mechanically intubated  SUBJECTIVE:  Unable to assess pt mechanically intubated   VITAL SIGNS: Temp:  [97.9 F  (36.6 C)-98.9 F (37.2 C)] 98.9 F (37.2 C) (08/24 1257) Pulse Rate:  [61-80] 64 (08/24 2230) Resp:  [10-23] 16 (08/24 2230) BP: (82-122)/(50-88) 108/70 (08/24 2230) SpO2:  [88 %-100 %] 100 % (08/24 2230) FiO2 (%):  [50 %] 50 % (08/24 2143) Weight:  [117.9 kg-130.6 kg] 117.9 kg (08/24 1258)  PHYSICAL EXAMINATION: General: acutely ill appearing male, mechanically intubated  Neuro: awake/agitated, follows commands, PERRL HEENT: supple, no JVD  Cardiovascular: nsr, rrr, no R/G  Lungs: rhonchi throughout, even, non labored, copious blood tinged secretions  Abdomen: +BS x4, obese, soft, non distended  Musculoskeletal: 1+ bilateral lower extremity edema Skin: intact no rashes or lesions present   Recent Labs  Lab 02/28/2020 1310  NA 147*  K 3.9  CL 103  CO2 35*  BUN 27*  CREATININE 0.69  GLUCOSE 104*   Recent Labs  Lab 02/28/2020 1310  HGB 13.5  HCT 45.5  WBC 5.9  PLT 137*   DG Abdomen 1 View  Result Date: 03/07/2020 CLINICAL DATA:  Intubation and OG tube placement. EXAM: ABDOMEN - 1 VIEW COMPARISON:  CT 02/25/2020 FINDINGS: Tip of the enteric tube is below the diaphragm,  the side port is in the region of the gastroesophageal junction. Patient has history of gastric bypass. Stool-filled colon interposed under the right hemidiaphragm. No evidence of free air. IMPRESSION: Tip of the enteric tube below the diaphragm, side port in the region of the gastroesophageal junction. This is likely appropriately position in this patient who is post gastric bypass. Electronically Signed   By: Keith Rake M.D.   On: 03/17/2020 22:19   CT Head Wo Contrast  Result Date: 03/14/2020 CLINICAL DATA:  74 year old male with altered mental status. EXAM: CT HEAD WITHOUT CONTRAST TECHNIQUE: Contiguous axial images were obtained from the base of the skull through the vertex without intravenous contrast. COMPARISON:  None. FINDINGS: Brain: There is moderate age-related atrophy and chronic microvascular  ischemic changes. There is no acute intracranial hemorrhage. No mass effect midline shift. No extra-axial fluid collection. A subcentimeter focus of calcification along the inner table of the left frontal calvarium may represent a small meningioma. Vascular: No hyperdense vessel or unexpected calcification. Skull: Normal. Negative for fracture or focal lesion. Sinuses/Orbits: No acute finding. Other: None IMPRESSION: 1. No acute intracranial pathology. 2. Moderate age-related atrophy and chronic microvascular ischemic changes. Electronically Signed   By: Anner Crete M.D.   On: 02/28/2020 20:02   DG Chest Portable 1 View  Result Date: 03/12/2020 CLINICAL DATA:  Intubation. EXAM: PORTABLE CHEST 1 VIEW COMPARISON:  Radiograph earlier today. FINDINGS: Endotracheal tube tip at the level of the clavicular heads. Enteric tube in place with tip below the diaphragm not included on this chest field of view. Persistent low lung volumes with elevation of the right hemidiaphragm. Stable heart size and mediastinal contours. Patchy bibasilar airspace opacities, left greater than right. No pneumothorax, large pleural effusion, or pulmonary edema. IMPRESSION: 1. Endotracheal tube tip at the level of the clavicular heads. Enteric tube in place. 2. Persistent low lung volumes with patchy bibasilar opacities, atelectasis versus pneumonia. Electronically Signed   By: Keith Rake M.D.   On: 03/17/2020 22:17   DG Chest Portable 1 View  Result Date: 03/15/2020 CLINICAL DATA:  Hypoxia EXAM: PORTABLE CHEST 1 VIEW COMPARISON:  02/25/2020 FINDINGS: Low lung volumes. Persistent patchy density at the right lung base. Increased patchy density at the left lung base. No significant pleural effusion. No pneumothorax. Cardiomegaly. IMPRESSION: Low lung volumes with bibasilar atelectasis/consolidation. Electronically Signed   By: Macy Mis M.D.   On: 03/15/2020 18:00    ASSESSMENT / PLAN:  Acute on chronic hypoxic  hypercapnic respiratory failure secondary to possible pneumonia complicated by OSA Mechanical intubation  Full vent support for now-vent settings reviewed and established  SBT once all parameters met  VAP bundle implemented  Scheduled and prn bronchodilator therapy  IV steroids   Hypotension likely secondary to volume depletion  Mildly elevated troponin secondary to demand ischemia in the setting of acute respiratory failure  Hx: HTN Continuous telemetry monitoring  Trend troponin's  Hold outpatient antihypertensives; continue outpatient simvastatin  Prn levophed gtt to maintain map >65 Trend BMP and monitor UOP  Possible pneumonia  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  Trend WBC and monitor fever curve  Trend PCT  Follow cultures  Continue cefepime Per ID consider possible cystoscopy while hospitalized due to ESBL colonization   Type II diabetes mellitus  CBG q4hrs  SSI   Acute encephalopathy secondary to hypercapnia Hx: Seizures and essential tremors  Maintain RASS goal 0 to -1 Propofol and fentanyl gtts to maintain RASS goal  WUA daily  Continue outpatient anticonvulsants  Best Practice: VTE px: subq lovenox SUP px: iv pepcid  Code status: Full Family: no family at bedside to update at this time   Marda Stalker, [place] Pager [phone] (please enter 7 digits) PCCM Consult Pager [phone] (please enter 7 digits)

## 2020-03-16 NOTE — Progress Notes (Signed)
Pharmacy Electrolyte Monitoring Consult:  Pharmacy consulted to assist in monitoring and replacing electrolytes in this 74 y.o. male admitted on 03/10/2020 with Weakness   Labs:  Sodium (mmol/L)  Date Value  02/22/2020 147 (H)   Potassium (mmol/L)  Date Value  03/10/2020 3.9   Magnesium (mg/dL)  Date Value  02/27/2020 1.9   Calcium (mg/dL)  Date Value  03/21/2020 8.3 (L)   Albumin (g/dL)  Date Value  02/25/2020 2.9 (L)    Assessment/Plan: Patient is hypernatremic w/ primary hypercapnic respiratory acidosis w/ non anion gap mixed metabolic alkalosis, K WNL, Mg/phos pending w/ am labs, no replacement needed at this time will continue to monitor.  Tobie Lords, PharmD, BCPS Clinical Pharmacist 03/15/2020 9:53 PM

## 2020-03-16 NOTE — ED Notes (Signed)
MD goodman made aware of patients blood pressure 99/54. Verbal order for 1096mL bolus

## 2020-03-16 NOTE — ED Triage Notes (Addendum)
Pt here with hyopxia and lethargy and unable to stay awake that started today. Pt also c/o of right hand that will drop unexpectedly. Pt sats in the 80s in triage. 3L placed on pt.

## 2020-03-16 NOTE — ED Notes (Signed)
attmepted to cath for urine at this time with no output pt receiving fluid bolus at this time

## 2020-03-16 NOTE — ED Provider Notes (Signed)
[facility] Emergency Department Provider Note  ____________________________________________   I have reviewed the triage vital signs and the nursing notes.   HISTORY  Chief Complaint Weakness   History limited by and level 5 caveat due to: Somnolence  HPI Curtis Alexander is a 74 y.o. male who presents to the emergency department today because of concern for inability to stay awake. On exam patient is quite somnolent so cannot give good history as to what his symptoms have been recently. The patient denies any chest pain or shortness of breath. The patient was recently admitted to the hospital.   Records reviewed. Per medical record review patient has a history of DM, GERD, HTN. Recent admissions for UTI and pneumonia.  Past Medical History:  Diagnosis Date  . Arthritis   . Cervical spinal stenosis   . Diabetes mellitus without complication ([name])   . Essential tremor   . GERD (gastroesophageal reflux disease)   . History of hiatal hernia    fixed with gastric bypass surgery  . History of kidney stones   . Hypertension   . Hypoglycemia   . Non-traumatic rupture of right patellar tendon 03/25/2019  . Sleep apnea    had gastric bypass and no longer uses cpap machine  . Spinal stenosis, lumbar     Patient Active Problem List   Diagnosis Date Noted  . Generalized weakness 02/25/2020  . RLL pneumonia 02/25/2020  . Recurrent UTI 02/25/2020  . Abnormal findings on diagnostic imaging of gallbladder 02/25/2020  . BPH (benign prostatic hyperplasia) 02/25/2020  . History of ESBL E. coli infection 02/25/2020  . Hyperlipidemia 02/12/2020  . Chronic diastolic CHF (congestive heart failure) ([place]) 02/12/2020  . Urinary tract infection due to extended-spectrum beta lactamase (ESBL) producing Escherichia coli 02/05/2020  . Urinary tract infection 01/09/2020  . Non-traumatic rupture of right patellar tendon 03/25/2019  . 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 Essential tremor 03/04/2019  . Gout 03/04/2019  . Intestinal malabsorption 09/29/2018  . 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 Lumbar herniated disc 12/17/2012  . Nephrolithiasis 06/11/2012  . Anxiety disorder 03/12/2012  . Status post abdominoplasty 04/30/2007  . Absence of bladder continence 11/04/2003  . Back pain 11/04/2003  . Depression 11/04/2003  . Diabetes mellitus, type II ([place]) 11/04/2003  . Hypertension 11/04/2003  . Insomnia 11/04/2003  . Obesity, Class III, BMI 40-49.9 (morbid obesity) ([place]) 11/04/2003  . Orthopnea 11/04/2003  . Peptic ulcer disease 11/04/2003  . Sleep apnea 11/04/2003  . Venous insufficiency 11/04/2003    Past Surgical History:  Procedure Laterality Date  . FRACTURE SURGERY Left    age 2  . GASTRIC BYPASS     fixed hiatal hernia  . KNEE ARTHROPLASTY Right 03/05/2019   Procedure: COMPUTER ASSISTED TOTAL KNEE ARTHROPLASTY;  Surgeon: Dereck Leep, MD;  Location: [facility] ORS;  Service: Orthopedics;  Laterality: Right;  . PATELLAR TENDON REPAIR Right 03/28/2019   Procedure: PATELLA TENDON REPAIR;  Surgeon: Dereck Leep, MD;  Location: [facility] ORS;  Service: Orthopedics;  Laterality: Right;  . SKIN SURGERY     after gastric bypass and pt became septic and had wound vac placed  . VENA CAVA FILTER PLACEMENT     WAS PLACED DURING PTS GASTRIC BYPASS SURGERY PER PT    Prior to Admission medications   Medication Sig Start Date End Date Taking? Authorizing Provider  allopurinol (ZYLOPRIM) 300 MG tablet Take  300 mg by mouth daily with lunch.    [provider]  aspirin EC 81 MG tablet Take 81 mg by mouth daily. Swallow whole.    [provider]  brimonidine (ALPHAGAN) 0.2 % ophthalmic solution Place 1 drop into the right eye 2 (two) times daily.    [provider]  Cholecalciferol (VITAMIN D) 50 MCG (2000 UT) CAPS Take  2,000 Units by mouth daily.    [provider]  clonazePAM (KLONOPIN) 0.5 MG tablet Take 0.25 in the morning, 0.25 at lunch, 0.5 mg at bedtime 02/12/20   Mariel Aloe, MD  Cranberry 250 MG TABS Take 250 mg by mouth daily.    [provider]  Cyanocobalamin (B-12) 2500 MCG TABS Take 2,500 mcg by mouth daily.     [provider]  cyclobenzaprine (FLEXERIL) 10 MG tablet Take 10 mg by mouth 2 (two) times daily.    [provider]  diazepam (VALIUM) 5 MG tablet 1 tab po 30 min prior to procedure 03/07/20   Stoioff, Ronda Fairly, MD  ferrous sulfate 325 (65 FE) MG tablet Take 1 tablet (325 mg total) by mouth every other day. 02/27/20   Enzo Bi, MD  finasteride (PROSCAR) 5 MG tablet Take 5 mg by mouth daily with lunch.    [provider]  furosemide (LASIX) 20 MG tablet Take 1 tablet (20 mg total) by mouth daily. 01/11/20 01/10/21  Rai, Vernelle Emerald, MD  gabapentin (NEURONTIN) 300 MG capsule Take 300 mg by mouth 2 (two) times daily. 12/29/19   [provider]  latanoprost (XALATAN) 0.005 % ophthalmic solution Place 1 drop into the right eye at bedtime.    [provider]  loratadine (CLARITIN) 10 MG tablet Take 10 mg by mouth daily as needed for allergies.    [provider]  meloxicam (MOBIC) 15 MG tablet Take 15 mg by mouth as needed. 03/12/20   [provider]  mirtazapine (REMERON) 45 MG tablet Take 45 mg by mouth at bedtime.    [provider]  nystatin (MYCOSTATIN/NYSTOP) powder Apply topically 2 (two) times daily. Apply to abdominal folds /chest wall crease 01/11/20   Rai, Ripudeep K, MD  omeprazole (PRILOSEC) 40 MG capsule Take 40 mg by mouth daily with lunch.    [provider]  primidone (MYSOLINE) 50 MG tablet Take 50-75 mg by mouth See admin instructions. Take 1 tablets (75mg ) by mouth every morning and take 1 tablet (50mg ) by mouth every afternoon    [provider]  propranolol (INDERAL) 40 MG  tablet Hold this medication until outpatient doctor's followup because your blood pressure has been low normal during your hospitalization. 02/27/20   Enzo Bi, MD  simvastatin (ZOCOR) 20 MG tablet Take 1 tablet by mouth every evening.    [provider]  tamsulosin (FLOMAX) 0.4 MG CAPS capsule Take 0.4 mg by mouth at bedtime.    [provider]  zolpidem (AMBIEN) 5 MG tablet Take 1 tablet (5 mg total) by mouth at bedtime as needed for sleep. 02/27/20   Enzo Bi, MD    Allergies Patient has no known allergies.  No family history on file.  Social History Social History   Tobacco Use  . Smoking status: Never Smoker  . Smokeless tobacco: Never Used  Vaping Use  . Vaping Use: Never used  Substance Use Topics  . Alcohol use: Yes    Comment: occ 1 glass per week  . Drug use: Never  Review of Systems Unable to obtain full ROS secondary to somnolence. Constitutional: No fever Cardiovascular: Denies chest pain. Respiratory: Denies shortness of breath. Gastrointestinal: No abdominal pain.    ____________________________________________   PHYSICAL EXAM:  VITAL SIGNS: ED Triage Vitals  Enc Vitals Group     BP 03/21/2020 1257 (!) 102/54     Pulse Rate 03/07/2020 1257 64     Resp 02/23/2020 1257 18     Temp 02/24/2020 1257 98.9 F (37.2 C)     Temp Source 03/07/2020 1257 Oral     SpO2 03/08/2020 1257 91 %     Weight 03/09/2020 1258 260 lb (117.9 kg)     Height 02/25/2020 1258 5\' 7"  (1.702 m)     Head Circumference --      Peak Flow --      Pain Score 02/23/2020 1258 0   Constitutional: Somnolent. Awakens to physical stimuli and can answer briefly before falling back asleep. Eyes: Conjunctivae are normal.  ENT      Head: Normocephalic and atraumatic.      Nose: No congestion/rhinnorhea.      Mouth/Throat: Mucous membranes are moist.      Neck: No stridor. Cardiovascular: Normal rate, regular rhythm.  No murmurs, rubs, or gallops.  Respiratory: Normal respiratory effort  without tachypnea nor retractions. Breath sounds are clear and equal bilaterally. No wheezes/rales/rhonchi. Gastrointestinal: Soft and non tender. No rebound. No guarding.  Genitourinary: Deferred Musculoskeletal: Slight edema in all extremities.  Neurologic:  Somnolent. Moving all extremities.  Skin:  Skin is warm, dry and intact. No rash noted.  ____________________________________________    LABS (pertinent positives/negatives)  CBC wbc 5.9, hgb 13.5, plt 137 BMP na 147, k 3.9, glu 104, cr 0.69 UA cloudy, protein 30, trace leukocytes, 21-50 wbc, many bacteria Ammonia 48 Trop hs 36 ABG pH 7.13, pCO2 >120.0 ____________________________________________   EKG  I, Nance Pear, attending physician, personally viewed and interpreted this EKG  EKG Time: 1303 Rate: 63 Rhythm: sinus rhythm Axis: left axis deviation Intervals: qtc 454 QRS: RBBB, LAFB ST changes: no st elevation Impression: abnormal ekg  ____________________________________________    RADIOLOGY  CXR Patchy opacities in bilateral bases  CT head No acute abnormality  CXR Opacities in bilateral lung bases concerning for atelectasis vs consolidation  CXR ET tube at level of clavicular heads ____________________________________________   PROCEDURES  Procedures  CRITICAL CARE Performed by: Nance Pear   Total critical care time: 50 minutes  Critical care time was exclusive of separately billable procedures and treating other patients.  Critical care was necessary to treat or prevent imminent or life-threatening deterioration.  Critical care was time spent personally by me on the following activities: development of treatment plan with patient and/or surrogate as well as nursing, discussions with consultants, evaluation of patient's response to treatment, examination of patient, obtaining history from patient or surrogate, ordering and performing treatments and interventions, ordering and  review of laboratory studies, ordering and review of radiographic studies, pulse oximetry and re-evaluation of patient's condition.  INTUBATION Performed by: Nance Pear  Required items: required blood products, implants, devices, and special equipment available Patient identity confirmed: provided demographic data and hospital-assigned identification number Time out: Immediately prior to procedure a "time out" was called to verify the correct patient, procedure, equipment, support staff and site/side marked as required.  Indications: AMS, hypercapnia  Intubation method: Glidescope Laryngoscopy   Preoxygenation: BVM  Sedatives: Etomidate Paralytic: Rocuronium  Tube Size: 7.5 cuffed  Post-procedure assessment: chest rise and ETCO2 monitor  Breath sounds: equal and absent over the epigastrium Tube secured with: ETT holder Chest x-ray interpreted by radiologist and me.  Chest x-ray findings: endotracheal tube in appropriate position  Patient tolerated the procedure well with no immediate complications.   ____________________________________________   INITIAL IMPRESSION / ASSESSMENT AND PLAN / ED COURSE  Pertinent labs & imaging results that were available during my care of the patient were reviewed by me and considered in my medical decision making (see chart for details).   Patient presented to the emergency department today because of concerns for increased somnolence. On initial exam patient was somnolent but arousable to physical stimuli. Broad work-up was initiated. I did have some concern for possible recurrent urinary tract infection given recent hospitalization for the same. CT head was negative. Ammonia only minimally elevated. ABG however returned with significant hypercapnia. Because of this and his significant altered mental status I did make the decision to intubate patient to try to help reverse the hypercapnia. After intubation the patient did have difficulty with  sedation given low blood pressure. Did advise pressors to be started. Patient was given multiple doses of fentanyl and Versed as well as did attempt ketamine to see if that would help with sedation and also help with blood pressure. Patient will be admitted to the ICU.  ____________________________________________   FINAL CLINICAL IMPRESSION(S) / ED DIAGNOSES  Final diagnoses:  Altered mental status, unspecified altered mental status type  Hypercapnia  Lower urinary tract infectious disease     Note: This dictation was prepared with Dragon dictation. Any transcriptional errors that result from this process are unintentional     Nance Pear, MD 03/18/2020 2358

## 2020-03-16 NOTE — ED Notes (Signed)
ED Provider at bedside. 

## 2020-03-16 NOTE — Progress Notes (Signed)
NAME: Curtis Alexander  DOB: 1946-01-30  MRN: [id number]  Date/Time: 03/03/2020 12:00 PM   Subjective:  Pt was recently in hospital with fatigue, weakness and Sob - pulse ox 88%   BP 112/73   Pulse 61   Temp 97.9 F (36.6 C)   Resp 16   Ht 6' (1.829 m)   Wt 288 lb (130.6 kg)   SpO2 91% Comment: removed mask  BMI 39.06 kg/m    Pt here with his daughter , who says she had trouble getting him for the visit as he as weak and could not walk ?  Past Surgical History:  Procedure Laterality Date  . FRACTURE SURGERY Left    age 74  . GASTRIC BYPASS     fixed hiatal hernia  . KNEE ARTHROPLASTY Right 03/05/2019   Procedure: COMPUTER ASSISTED TOTAL KNEE ARTHROPLASTY;  Surgeon: Dereck Leep, MD;  Location: [facility] ORS;  Service: Orthopedics;  Laterality: Right;  . PATELLAR TENDON REPAIR Right 03/28/2019   Procedure: PATELLA TENDON REPAIR;  Surgeon: Dereck Leep, MD;  Location: [facility] ORS;  Service: Orthopedics;  Laterality: Right;  . SKIN SURGERY     after gastric bypass and pt became septic and had wound vac placed  . VENA CAVA FILTER PLACEMENT     WAS PLACED DURING PTS GASTRIC BYPASS SURGERY PER PT    Social History   Socioeconomic History  . Marital status: Married    Spouse name: Not on file  . Number of children: Not on file  . Years of education: Not on file  . Highest education level: Not on file  Occupational History  . Not on file  Tobacco Use  . Smoking status: Never Smoker  . Smokeless tobacco: Never Used  Vaping Use  . Vaping Use: Never used  Substance and Sexual Activity  . Alcohol use: Yes    Comment: occ 1 glass per week  . Drug use: Never  . Sexual activity: Not on file  Other Topics Concern  . Not on file  Social History Narrative  . Not on file   Social Determinants of Health   Financial Resource Strain:   . Difficulty of Paying Living Expenses: Not on file  Food Insecurity:   . Worried About Charity fundraiser in the Last Year: Not on file  . Ran  Out of Food in the Last Year: Not on file  Transportation Needs:   . Lack of Transportation (Medical): Not on file  . Lack of Transportation (Non-Medical): Not on file  Physical Activity:   . Days of Exercise per Week: Not on file  . Minutes of Exercise per Session: Not on file  Stress:   . Feeling of Stress : Not on file  Social Connections:   . Frequency of Communication with Friends and Family: Not on file  . Frequency of Social Gatherings with Friends and Family: Not on file  . Attends Religious Services: Not on file  . Active Member of Clubs or Organizations: Not on file  . Attends Archivist Meetings: Not on file  . Marital Status: Not on file  Intimate Partner Violence:   . Fear of Current or Ex-Partner: Not on file  . Emotionally Abused: Not on file  . Physically Abused: Not on file  . Sexually Abused: Not on file    No family history on file. No Known Allergies  ? Current Outpatient Medications  Medication Sig Dispense Refill  . allopurinol (ZYLOPRIM) 300 MG tablet Take  300 mg by mouth daily with lunch.    Marland Kitchen aspirin EC 81 MG tablet Take 81 mg by mouth daily. Swallow whole.    . brimonidine (ALPHAGAN) 0.2 % ophthalmic solution Place 1 drop into the right eye 2 (two) times daily.    . Cholecalciferol (VITAMIN D) 50 MCG (2000 UT) CAPS Take 2,000 Units by mouth daily.    . clonazePAM (KLONOPIN) 0.5 MG tablet Take 0.25 in the morning, 0.25 at lunch, 0.5 mg at bedtime 5 tablet 0  . Cranberry 250 MG TABS Take 250 mg by mouth daily.    . Cyanocobalamin (B-12) 2500 MCG TABS Take 2,500 mcg by mouth daily.     . cyclobenzaprine (FLEXERIL) 10 MG tablet Take 10 mg by mouth 2 (two) times daily.    . diazepam (VALIUM) 5 MG tablet 1 tab po 30 min prior to procedure 1 tablet 0  . ferrous sulfate 325 (65 FE) MG tablet Take 1 tablet (325 mg total) by mouth every other day.    . finasteride (PROSCAR) 5 MG tablet Take 5 mg by mouth daily with lunch.    . furosemide (LASIX) 20 MG  tablet Take 1 tablet (20 mg total) by mouth daily. 30 tablet 11  . gabapentin (NEURONTIN) 300 MG capsule Take 300 mg by mouth 2 (two) times daily.    Marland Kitchen latanoprost (XALATAN) 0.005 % ophthalmic solution Place 1 drop into the right eye at bedtime.    Marland Kitchen loratadine (CLARITIN) 10 MG tablet Take 10 mg by mouth daily as needed for allergies.    . meloxicam (MOBIC) 15 MG tablet Take 15 mg by mouth as needed.    . mirtazapine (REMERON) 45 MG tablet Take 45 mg by mouth at bedtime.    Marland Kitchen nystatin (MYCOSTATIN/NYSTOP) powder Apply topically 2 (two) times daily. Apply to abdominal folds /chest wall crease 30 g 3  . omeprazole (PRILOSEC) 40 MG capsule Take 40 mg by mouth daily with lunch.    . primidone (MYSOLINE) 50 MG tablet Take 50-75 mg by mouth See admin instructions. Take 1 tablets (75mg ) by mouth every morning and take 1 tablet (50mg ) by mouth every afternoon    . propranolol (INDERAL) 40 MG tablet Hold this medication until outpatient doctor's followup because your blood pressure has been low normal during your hospitalization.    . simvastatin (ZOCOR) 20 MG tablet Take 1 tablet by mouth every evening.    . tamsulosin (FLOMAX) 0.4 MG CAPS capsule Take 0.4 mg by mouth at bedtime.    Marland Kitchen zolpidem (AMBIEN) 5 MG tablet Take 1 tablet (5 mg total) by mouth at bedtime as needed for sleep. 5 tablet 0   No current facility-administered medications for this visit.     Abtx:  Anti-infectives (From admission, onward)   None     Objective:  VITALS:  BP 112/73   Pulse 61   Temp 97.9 F (36.6 C)   Resp 16   Ht 6' (1.829 m)   Wt 288 lb (130.6 kg)   SpO2 91% Comment: removed mask  BMI 39.06 kg/m  PHYSICAL EXAM:  General: Alert, chronically ill, pale cooperative, lethargic Pulse ox was 88% and then went upto to 91%  Head: Normocephalic, without obvious abnormality, atraumatic. Lungs: Clear to auscultation bilaterally. No Wheezing or Rhonchi. No rales. Heart: Regular rate and rhythm, no murmur, rub or  gallop.HR 62 Abdomen: did not examine Extremities: atraumatic, no cyanosis. No edema. No clubbing Neurologic: jerks arms Pertinent Labs Lab Results CBC  Component Value Date/Time   WBC 5.2 02/27/2020 0456   RBC 4.13 (L) 02/27/2020 0456   HGB 12.7 (L) 02/27/2020 0456   HCT 40.4 02/27/2020 0456   PLT 147 (L) 02/27/2020 0456   MCV 97.8 02/27/2020 0456   MCH 30.8 02/27/2020 0456   MCHC 31.4 02/27/2020 0456   RDW 13.7 02/27/2020 0456   LYMPHSABS 2.0 02/25/2020 1541   MONOABS 0.9 02/25/2020 1541   EOSABS 0.4 02/25/2020 1541   BASOSABS 0.1 02/25/2020 1541    CMP Latest Ref Rng & Units 02/27/2020 02/25/2020 02/08/2020  Glucose 70 - 99 mg/dL 90 85 102(H)  BUN 8 - 23 mg/dL 17 28(H) 19  Creatinine 0.61 - 1.24 mg/dL 0.53(L) 0.76 0.65  Sodium 135 - 145 mmol/L 142 140 141  Potassium 3.5 - 5.1 mmol/L 4.2 3.9 4.4  Chloride 98 - 111 mmol/L 101 99 96(L)  CO2 22 - 32 mmol/L 35(H) 33(H) 33(H)  Calcium 8.9 - 10.3 mg/dL 8.0(L) 8.4(L) 8.6(L)  Total Protein 6.5 - 8.1 g/dL - 6.3(L) -  Total Bilirubin 0.3 - 1.2 mg/dL - 0.7 -  Alkaline Phos 38 - 126 U/L - 69 -  AST 15 - 41 U/L - 13(L) -  ALT 0 - 44 U/L - 11 -      Microbiology: No results found for this or any previous visit (from the past 240 hour(s)).  IMAGING RESULTS: I have personally reviewed the films ? Impression/Recommendation ? ?Fatigue, weakness, jerking movts,  ? Drug induced- on polypharmacy- Klonopin primidone, flexeril, ambien, mirtazipine  ? Beta blocker induced  Hypoxia-- unclear etiology Could he have neuromuscular disorder like MG or lambert eaton??  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 So during this hospital stay try to get cystoscopy  Pt sent to ED ? ___________________________________________________ Discussed with patient,and daughter Note:  This document was  prepared using Dragon voice recognition software and may include unintentional dictation errors.

## 2020-03-17 DIAGNOSIS — A419 Sepsis, unspecified organism: Principal | ICD-10-CM

## 2020-03-17 DIAGNOSIS — E873 Alkalosis: Secondary | ICD-10-CM | POA: Diagnosis not present

## 2020-03-17 DIAGNOSIS — R6521 Severe sepsis with septic shock: Secondary | ICD-10-CM

## 2020-03-17 LAB — BLOOD GAS, ARTERIAL
Acid-Base Excess: 12.4 mmol/L — ABNORMAL HIGH (ref 0.0–2.0)
Bicarbonate: 35.8 mmol/L — ABNORMAL HIGH (ref 20.0–28.0)
FIO2: 0.5
MECHVT: 500 mL
O2 Saturation: 99.4 %
PEEP: 5 cmH2O
Patient temperature: 37
RATE: 20 resp/min
pCO2 arterial: 40 mmHg (ref 32.0–48.0)
pH, Arterial: 7.56 — ABNORMAL HIGH (ref 7.350–7.450)
pO2, Arterial: 140 mmHg — ABNORMAL HIGH (ref 83.0–108.0)

## 2020-03-17 LAB — TRIGLYCERIDES: Triglycerides: 64 mg/dL (ref <150)

## 2020-03-17 LAB — BASIC METABOLIC PANEL
Anion gap: 11 (ref 5–15)
BUN: 28 mg/dL — ABNORMAL HIGH (ref 8–23)
CO2: 34 mmol/L — ABNORMAL HIGH (ref 22–32)
Calcium: 8.5 mg/dL — ABNORMAL LOW (ref 8.9–10.3)
Chloride: 101 mmol/L (ref 98–111)
Creatinine, Ser: 0.81 mg/dL (ref 0.61–1.24)
GFR calc Af Amer: >60 mL/min (ref >60)
GFR calc non Af Amer: >60 mL/min (ref >60)
Glucose, Bld: 107 mg/dL — ABNORMAL HIGH (ref 70–99)
Potassium: 4.2 mmol/L (ref 3.5–5.1)
Sodium: 146 mmol/L — ABNORMAL HIGH (ref 135–145)

## 2020-03-17 LAB — PROCALCITONIN: Procalcitonin: <0.1 ng/mL

## 2020-03-17 LAB — CBC
HCT: 50.5 % (ref 39.0–52.0)
Hemoglobin: 15.2 g/dL (ref 13.0–17.0)
MCH: 30.5 pg (ref 26.0–34.0)
MCHC: 30.1 g/dL (ref 30.0–36.0)
MCV: 101.2 fL — ABNORMAL HIGH (ref 80.0–100.0)
Platelets: 123 10*3/uL — ABNORMAL LOW (ref 150–400)
RBC: 4.99 MIL/uL (ref 4.22–5.81)
RDW: 14.6 % (ref 11.5–15.5)
WBC: 6.8 10*3/uL (ref 4.0–10.5)
nRBC: 0 % (ref 0.0–0.2)

## 2020-03-17 LAB — GLUCOSE, CAPILLARY
Glucose-Capillary: 112 mg/dL — ABNORMAL HIGH (ref 70–99)
Glucose-Capillary: 114 mg/dL — ABNORMAL HIGH (ref 70–99)
Glucose-Capillary: 120 mg/dL — ABNORMAL HIGH (ref 70–99)
Glucose-Capillary: 122 mg/dL — ABNORMAL HIGH (ref 70–99)
Glucose-Capillary: 78 mg/dL (ref 70–99)
Glucose-Capillary: 82 mg/dL (ref 70–99)
Glucose-Capillary: 99 mg/dL (ref 70–99)

## 2020-03-17 LAB — TROPONIN I (HIGH SENSITIVITY)
Troponin I (High Sensitivity): 23 ng/L — ABNORMAL HIGH (ref <18)
Troponin I (High Sensitivity): 42 ng/L — ABNORMAL HIGH (ref <18)

## 2020-03-17 LAB — MRSA PCR SCREENING: MRSA by PCR: NEGATIVE

## 2020-03-17 LAB — PHOSPHORUS: Phosphorus: 3.6 mg/dL (ref 2.5–4.6)

## 2020-03-17 LAB — MAGNESIUM: Magnesium: 2 mg/dL (ref 1.7–2.4)

## 2020-03-17 MED ORDER — METHYLPREDNISOLONE SODIUM SUCC 40 MG IJ SOLR
40.0000 mg | Freq: Two times a day (BID) | INTRAMUSCULAR | Status: DC
  Administered 2020-03-17: 40 mg via INTRAVENOUS
  Filled 2020-03-17: qty 1

## 2020-03-17 MED ORDER — ORAL CARE MOUTH RINSE
15.0000 mL | OROMUCOSAL | Status: DC
  Administered 2020-03-17 – 2020-03-18 (×12): 15 mL via OROMUCOSAL

## 2020-03-17 MED ORDER — ENOXAPARIN SODIUM 80 MG/0.8ML ~~LOC~~ SOLN
0.5000 mg/kg | SUBCUTANEOUS | Status: DC
  Administered 2020-03-18: 62.5 mg via SUBCUTANEOUS
  Filled 2020-03-17: qty 0.8

## 2020-03-17 MED ORDER — PROSOURCE TF PO LIQD
45.0000 mL | Freq: Three times a day (TID) | ORAL | Status: DC
  Administered 2020-03-17 – 2020-03-18 (×2): 45 mL
  Filled 2020-03-17: qty 45

## 2020-03-17 MED ORDER — CHLORHEXIDINE GLUCONATE 0.12% ORAL RINSE (MEDLINE KIT)
15.0000 mL | Freq: Two times a day (BID) | OROMUCOSAL | Status: DC
  Administered 2020-03-17 – 2020-03-18 (×3): 15 mL via OROMUCOSAL

## 2020-03-17 MED ORDER — SODIUM CHLORIDE 0.9 % IV SOLN
1.0000 g | Freq: Three times a day (TID) | INTRAVENOUS | Status: DC
  Administered 2020-03-17 – 2020-03-18 (×3): 1 g via INTRAVENOUS
  Filled 2020-03-17 (×7): qty 1

## 2020-03-17 MED ORDER — HYDROCORTISONE NA SUCCINATE PF 100 MG IJ SOLR
50.0000 mg | Freq: Four times a day (QID) | INTRAMUSCULAR | Status: DC
  Administered 2020-03-17 – 2020-03-18 (×5): 50 mg via INTRAVENOUS
  Filled 2020-03-17 (×5): qty 2

## 2020-03-17 MED ORDER — ADULT MULTIVITAMIN LIQUID CH
15.0000 mL | Freq: Every day | ORAL | Status: DC
  Administered 2020-03-18: 15 mL
  Filled 2020-03-17: qty 15

## 2020-03-17 MED ORDER — IPRATROPIUM-ALBUTEROL 0.5-2.5 (3) MG/3ML IN SOLN
3.0000 mL | Freq: Four times a day (QID) | RESPIRATORY_TRACT | Status: DC
  Administered 2020-03-17 – 2020-03-18 (×6): 3 mL via RESPIRATORY_TRACT
  Filled 2020-03-17 (×6): qty 3

## 2020-03-17 MED ORDER — CHLORHEXIDINE GLUCONATE CLOTH 2 % EX PADS
6.0000 | MEDICATED_PAD | Freq: Every day | CUTANEOUS | Status: DC
  Administered 2020-03-17: 6 via TOPICAL

## 2020-03-17 MED ORDER — VITAL HIGH PROTEIN PO LIQD
1000.0000 mL | ORAL | Status: DC
  Administered 2020-03-17 – 2020-03-18 (×2): 1000 mL

## 2020-03-17 MED ORDER — LACTATED RINGERS IV BOLUS
1000.0000 mL | Freq: Once | INTRAVENOUS | Status: AC
  Administered 2020-03-17: 1000 mL via INTRAVENOUS

## 2020-03-17 MED ORDER — INSULIN ASPART 100 UNIT/ML ~~LOC~~ SOLN
0.0000 [IU] | SUBCUTANEOUS | Status: DC
  Administered 2020-03-17: 2 [IU] via SUBCUTANEOUS
  Administered 2020-03-18: 3 [IU] via SUBCUTANEOUS
  Administered 2020-03-18 (×2): 2 [IU] via SUBCUTANEOUS
  Filled 2020-03-17 (×4): qty 1

## 2020-03-17 NOTE — Progress Notes (Addendum)
Initial Nutrition Assessment  DOCUMENTATION CODES:   Morbid obesity  INTERVENTION:   Vital HP @60ml /hr + Pro-Source 53ml TID via tube  Propofol: 3.5 ml/hr- provides 92kcal/day   Free water flushes 65ml q4 hours to maintain tube patency   Regimen provides 1652kcal/day, 159g/day protein and 1349ml/day free water   Liquid MVI daily via tube   Pt at high risk for nutrient deficiencies r/t his gastric bypass surgery. Recommend check thiamine, B12, folate, iron, calcium, zinc, copper, and vitamins D, A, E, & K yearly.   NUTRITION DIAGNOSIS:   Inadequate oral intake related to inability to eat (Pt sedated and ventilated) as evidenced by NPO status.  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 OGT in appropriate place per radiology report. Spoke with pt's wife at bedside who reports that pt with good appetite and oral intake at baseline. Wife reports that pt does not follow any specific diet at home r/t his h/o gastric bypass; wife also reports that pt does not take any daily vitamins. Per chart, pt weighed 465lbs prior to his gastric bypass. Pt appears to be fairly weight stable over he past few years. Wife reports that pt has had some  weight loss lately r/t stress. Per chart, pt down 9lbs(4%) from his UBW; this is not likely significant. Plan is to initiate tube feeds today.   Medications reviewed and include: colace, lovenox, fentanyl, insulin, miralax, cefepime, pepcid, levophed, propofol   Labs reviewed: Na 146(H), BUN 28(H), P 3.6 wnl, Mg 2.0 wnl  Patient is currently intubated on ventilator support MV: 9.0 L/min Temp (24hrs), Avg:99.3 F (37.4 C), Min:98.7 F (37.1 C), Max:99.9 F (37.7 C)  Propofol: 3.5 ml/hr- provides 92kcal/day   MAP- >46mmHg  UOP- 632ml output   NUTRITION - FOCUSED PHYSICAL EXAM:    Most Recent Value  Orbital Region Mild depletion  Upper Arm Region No depletion  Thoracic and Lumbar Region No depletion  Buccal Region Mild depletion  Temple Region Moderate depletion  Clavicle Bone Region Mild depletion  Clavicle and Acromion Bone Region Mild depletion  Scapular Bone Region No depletion  Dorsal Hand No depletion  Patellar Region No depletion  Anterior Thigh Region No depletion  Posterior Calf Region No depletion  Edema (RD Assessment) Mild  Hair Reviewed  Eyes Reviewed  Mouth Reviewed  Skin Reviewed  Nails Reviewed     Diet Order:   Diet Order            Diet NPO time specified  Diet effective now                EDUCATION NEEDS:   No education needs have been identified at this time  Skin:  Skin Assessment: Reviewed RN Assessment (ecchymosis)  Last BM:  pta  Height:   Ht  Readings from Last 1 Encounters:  03/11/2020 5\' 7"  (1.702 m)    Weight:   Wt Readings from Last 1 Encounters:  03/17/20 126.2 kg    Ideal Body Weight:  67.2 kg  BMI:  Body mass index is 43.58 kg/m.  Estimated Nutritional Needs:   Kcal:  1386-1764kcal/day  Protein:  135-165g/day  Fluid:  2L/day  Curtis Distance MS, RD, LDN Please refer to Curtis Alexander for RD and/or RD on-call/weekend/after hours pager

## 2020-03-17 NOTE — Progress Notes (Signed)
Transported pt to ICU 16 on the vent without incident. Pt remains on vent and is tol well at this time. Report given to ICU RT.

## 2020-03-17 NOTE — Progress Notes (Signed)
Received pt from ED on full vent support, on levo, propofol and fentanyl gtt. Withdraws to pain. VSS.

## 2020-03-17 NOTE — Progress Notes (Signed)
CRITICAL VALUE ALERT  Critical Value: Troponin 42  Date & Time Notied: 03/17/20 @0330   Provider Notified: Marda Stalker NP  Orders Received/Actions taken: Cont to monitor.

## 2020-03-17 NOTE — Progress Notes (Signed)
CRITICAL CARE NOTE 8/24 admitted for severe resp failure and septic shock 8/25 remains on vent  CC  follow up respiratory failure  SUBJECTIVE Patient remains critically ill Prognosis is guarded   BP (!) 112/53   Pulse 62   Temp 99.2 F (37.3 C) (Oral)   Resp 14   Ht 5\' 7"  (1.702 m)   Wt 126.2 kg   SpO2 99%   BMI 43.58 kg/m    I/O last 3 completed shifts: In: 1908 [I.V.:458; IV Piggyback:1450] Out: 650 [Urine:650] No intake/output data recorded.  SpO2: 99 % O2 Flow Rate (L/min): 2 L/min FiO2 (%): 40 %  Estimated body mass index is 43.58 kg/m as calculated from the following:   Height as of this encounter: 5\' 7"  (1.702 m).   Weight as of this encounter: 126.2 kg.  SIGNIFICANT EVENTS   REVIEW OF SYSTEMS  PATIENT IS UNABLE TO PROVIDE COMPLETE REVIEW OF SYSTEMS DUE TO SEVERE CRITICAL ILLNESS        PHYSICAL EXAMINATION:  GENERAL:critically ill appearing, +resp distress HEAD: Normocephalic, atraumatic.  EYES: Pupils equal, round, reactive to light.  No scleral icterus.  MOUTH: Moist mucosal membrane. NECK: Supple.  PULMONARY: +rhonchi, +wheezing CARDIOVASCULAR: S1 and S2. Regular rate and rhythm. No murmurs, rubs, or gallops.  GASTROINTESTINAL: Soft, nontender, -distended.  Positive bowel sounds.   MUSCULOSKELETAL: No swelling, clubbing, or edema.  NEUROLOGIC: obtunded, GCS<8 SKIN:intact,warm,dry  MEDICATIONS: I have reviewed all medications and confirmed regimen as documented   CULTURE RESULTS   Recent Results (from the past 240 hour(s))  SARS Coronavirus 2 by RT PCR (hospital order, performed in [facility] hospital lab) Nasopharyngeal Nasopharyngeal Swab     Status: None   Collection Time: 03/18/2020  6:03 PM   Specimen: Nasopharyngeal Swab  Result Value Ref Range Status   SARS Coronavirus 2 NEGATIVE NEGATIVE Final    Comment: (NOTE) SARS-CoV-2 target nucleic acids are NOT DETECTED.  The SARS-CoV-2 RNA is generally detectable in upper and  lower respiratory specimens during the acute phase of infection. The lowest concentration of SARS-CoV-2 viral copies this assay can detect is 250 copies / mL. A negative result does not preclude SARS-CoV-2 infection and should not be used as the sole basis for treatment or other patient management decisions.  A negative result may occur with improper specimen collection / handling, submission of specimen other than nasopharyngeal swab, presence of viral mutation(s) within the areas targeted by this assay, and inadequate number of viral copies (<250 copies / mL). A negative result must be combined with clinical observations, patient history, and epidemiological information.  Fact Sheet for Patients:   StrictlyIdeas.no  Fact Sheet for Healthcare Providers: BankingDealers.co.za  This test is not yet approved or  cleared by the [place] FDA and has been authorized for detection and/or diagnosis of SARS-CoV-2 by FDA under an Emergency Use Authorization (EUA).  This EUA will remain in effect (meaning this test can be used) for the duration of the COVID-19 declaration under Section 564(b)(1) of the Act, 21 U.S.C. section 360bbb-3(b)(1), unless the authorization is terminated or revoked sooner.  Performed at [facility] [address] [place], [place] [address]   Blood culture (routine x 2)     Status: None (Preliminary result)   Collection Time: 03/18/2020  6:52 PM   Specimen: BLOOD  Result Value Ref Range Status   Specimen Description BLOOD LEFT ARM  Final   Special Requests   Final    BOTTLES DRAWN AEROBIC AND ANAEROBIC Blood Culture  adequate volume   Culture   Final    NO GROWTH < 12 HOURS Performed at [facility], [address] [place], [place] [address]    Report Status PENDING  Incomplete  Blood culture (routine x 2)     Status: None (Preliminary result)   Collection Time: 03/13/2020  6:52 PM    Specimen: BLOOD  Result Value Ref Range Status   Specimen Description BLOOD RIGHT [facility]  Final   Special Requests   Final    BOTTLES DRAWN AEROBIC AND ANAEROBIC Blood Culture results may not be optimal due to an inadequate volume of blood received in culture bottles   Culture   Final    NO GROWTH < 12 HOURS Performed at [facility], [address], [place], [place] [address]    Report Status PENDING  Incomplete  MRSA PCR Screening     Status: None   Collection Time: 03/17/20 12:53 AM   Specimen: Nasopharyngeal  Result Value Ref Range Status   MRSA by PCR NEGATIVE NEGATIVE Final    Comment:        The GeneXpert MRSA Assay (FDA approved for NASAL specimens only), is one component of a comprehensive MRSA colonization surveillance program. It is not intended to diagnose MRSA infection nor to guide or monitor treatment for MRSA infections. Performed at [facility], [place], [place] [address]           IMAGING    DG Abdomen 1 View  Result Date: 02/25/2020 CLINICAL DATA:  Intubation and OG tube placement. EXAM: ABDOMEN - 1 VIEW COMPARISON:  CT 02/25/2020 FINDINGS: Tip of the enteric tube is below the diaphragm, the side port is in the region of the gastroesophageal junction. Patient has history of gastric bypass. Stool-filled colon interposed under the right hemidiaphragm. No evidence of free air. IMPRESSION: Tip of the enteric tube below the diaphragm, side port in the region of the gastroesophageal junction. This is likely appropriately position in this patient who is post gastric bypass. Electronically Signed   By: Keith Rake M.D.   On: 03/18/2020 22:19   CT Head Wo Contrast  Result Date: 03/04/2020 CLINICAL DATA:  74 year old male with altered mental status. EXAM: CT HEAD WITHOUT CONTRAST TECHNIQUE: Contiguous axial images were obtained from the base of the skull through the vertex without intravenous contrast. COMPARISON:  None.  FINDINGS: Brain: There is moderate age-related atrophy and chronic microvascular ischemic changes. There is no acute intracranial hemorrhage. No mass effect midline shift. No extra-axial fluid collection. A subcentimeter focus of calcification along the inner table of the left frontal calvarium may represent a small meningioma. Vascular: No hyperdense vessel or unexpected calcification. Skull: Normal. Negative for fracture or focal lesion. Sinuses/Orbits: No acute finding. Other: None IMPRESSION: 1. No acute intracranial pathology. 2. Moderate age-related atrophy and chronic microvascular ischemic changes. Electronically Signed   By: Anner Crete M.D.   On: 02/26/2020 20:02   DG Chest Portable 1 View  Result Date: 03/11/2020 CLINICAL DATA:  Intubation. EXAM: PORTABLE CHEST 1 VIEW COMPARISON:  Radiograph earlier today. FINDINGS: Endotracheal tube tip at the level of the clavicular heads. Enteric tube in place with tip below the diaphragm not included on this chest field of view. Persistent low lung volumes with elevation of the right hemidiaphragm. Stable heart size and mediastinal contours. Patchy bibasilar airspace opacities, left greater than right. No pneumothorax, large pleural effusion, or pulmonary edema. IMPRESSION: 1. Endotracheal tube tip at the level of the  clavicular heads. Enteric tube in place. 2. Persistent low lung volumes with patchy bibasilar opacities, atelectasis versus pneumonia. Electronically Signed   By: Keith Rake M.D.   On: 02/27/2020 22:17   DG Chest Portable 1 View  Result Date: 03/18/2020 CLINICAL DATA:  Hypoxia EXAM: PORTABLE CHEST 1 VIEW COMPARISON:  02/25/2020 FINDINGS: Low lung volumes. Persistent patchy density at the right lung base. Increased patchy density at the left lung base. No significant pleural effusion. No pneumothorax. Cardiomegaly. IMPRESSION: Low lung volumes with bibasilar atelectasis/consolidation. Electronically Signed   By: Macy Mis M.D.    On: 03/02/2020 18:00     Nutrition Status:       CBC    Component Value Date/Time   WBC 6.8 03/17/2020 0031   RBC 4.99 03/17/2020 0031   HGB 15.2 03/17/2020 0031   HCT 50.5 03/17/2020 0031   PLT 123 (L) 03/17/2020 0031   MCV 101.2 (H) 03/17/2020 0031   MCH 30.5 03/17/2020 0031   MCHC 30.1 03/17/2020 0031   RDW 14.6 03/17/2020 0031   LYMPHSABS 2.0 02/25/2020 1541   MONOABS 0.9 02/25/2020 1541   EOSABS 0.4 02/25/2020 1541   BASOSABS 0.1 02/25/2020 1541   BMP Latest Ref Rng & Units 03/17/2020 03/23/2020 02/27/2020  Glucose 70 - 99 mg/dL 107(H) 104(H) 90  BUN 8 - 23 mg/dL 28(H) 27(H) 17  Creatinine 0.61 - 1.24 mg/dL 0.81 0.69 0.53(L)  Sodium 135 - 145 mmol/L 146(H) 147(H) 142  Potassium 3.5 - 5.1 mmol/L 4.2 3.9 4.2  Chloride 98 - 111 mmol/L 101 103 101  CO2 22 - 32 mmol/L 34(H) 35(H) 35(H)  Calcium 8.9 - 10.3 mg/dL 8.5(L) 8.3(L) 8.0(L)       Indwelling Urinary Catheter continued, requirement due to   Reason to continue Indwelling Urinary Catheter strict Intake/Output monitoring for hemodynamic instability         Ventilator continued, requirement due to severe respiratory failure   Ventilator Sedation RASS 0 to -2      ASSESSMENT AND PLAN SYNOPSIS  74 yo white male with acute and severe hypoxic resp failure due to pneumonia and septic shock  Severe ACUTE Hypoxic and Hypercapnic Respiratory Failure -continue Full MV support -continue Bronchodilator Therapy -Wean Fio2 and PEEP as tolerated -VAP/VENT bundle implementation  ACUTE DIASTOLIC CARDIAC FAILURE- VENT SUPPORT    Morbid obesity, possible OSA.   Will certainly impact respiratory mechanics, ventilator weaning Suspect will need to consider additional PEEP    NEUROLOGY Acute toxic metabolic encephalopathy, need for sedation Goal RASS -2 to -3  SHOCK-SEPSIS/HYPOVOLUMIC -use vasopressors to keep MAP>65 -follow ABG and LA -follow up cultures -emperic ABX -consider stress dose  steroids -aggressive IV fluid resuscitation  CARDIAC ICU monitoring  ID -continue IV abx as prescibed -follow up cultures  GI GI PROPHYLAXIS as indicated  NUTRITIONAL STATUS Nutrition Status:         DIET-->TF's as tolerated Constipation protocol as indicated  ENDO - will use ICU hypoglycemic\Hyperglycemia protocol if indicated     ELECTROLYTES -follow labs as needed -replace as needed -pharmacy consultation and following   DVT/GI PRX ordered and assessed TRANSFUSIONS AS NEEDED MONITOR FSBS I Assessed the need for Labs I Assessed the need for Foley I Assessed the need for Central Venous Line Family Discussion when available I Assessed the need for Mobilization I made an Assessment of medications to be adjusted accordingly Safety Risk assessment completed   CASE DISCUSSED IN MULTIDISCIPLINARY ROUNDS WITH ICU TEAM  Critical Care Time devoted to patient care services  described in this note is 36 minutes.   Overall, patient is critically ill, prognosis is guarded.  Patient with Multiorgan failure and at high risk for cardiac arrest and death.    Corrin Parker, M.D.  [facility]  Medical Director [place] Director [facility] Cardio-Pulmonary Department

## 2020-03-17 NOTE — Consult Note (Signed)
NAME: Curtis Alexander  DOB: 11-Dec-1945  MRN: [id number]  Date/Time: 03/17/2020 12:31 PM  REQUESTING PROVIDER: [name] Subjective:  REASON FOR CONSULT: sepsis ? Curtis Alexander is a 74 y.o. male with diabetes mellitus, hypertension, OSA, history of gastric bypass , renal stone, colonized with ESBL e.coli , polypharmacy with sedative meds  Admitted with fatigue, severe weakness, , hypoxia Pt was in my clinic yesterday with his daughter  to discuss antibiotic prior to cystoscopy on 03/25/20 . HE was c/o of above symptoms and pulse ox was 88%, HR 62 and BP 112/73 and temp 97.9. He was oriented X 5 and able to give a clear history. He was sent to ED because of unexplained hypoxia and also I noted jerking movts on his rt hand In the ED BP 102/54, afebrile, pulse ox 88% Labs in the EDwere Na 147, K 3.9, Cr 0.69, co2 35, lactate 0.9, procal < 0.10 CXR showed b/l lower lobe atelectasis vs consolidation As BP later was 99/54 was given a litre of fluids Blood culture sent and he was given 1 dose of ceftriaxone and 1 dose of vanco He was placed on 3l oxygen and at 9.10 pm ABG showed co2 of > 120 and o2 of 129 He has to be intubated and was transferred to the ICU  Recurrent hospitalization for generalized fatigue since June 2021  01/09/20-01/11/20- fatigue, treated as UTI  7/15-7/22/21 admitted for fatigue, and dark urine and told staff he had UTI and urine culture ESBl e.coli and treated with zosyn  8/4-02/27/20 admitted again for weakness, also was hypoxic- No antibiotics given as he did not have an infection but colonization with ESBL   Plan was to get cystoscopy as OP with Dr.Stoioff. prior to the procedure he was to get ertapenem.  Past Medical History:  Diagnosis Date  . Arthritis   . Cervical spinal stenosis   . Diabetes mellitus without complication ([place])   . Essential tremor   . GERD (gastroesophageal reflux disease)   . History of hiatal hernia    fixed with gastric bypass surgery  .  History of kidney stones   . Hypertension   . Hypoglycemia   . Non-traumatic rupture of right patellar tendon 03/25/2019  . Sleep apnea    had gastric bypass and no longer uses cpap machine  . Spinal stenosis, lumbar      PSH Gastric bypass  Rt TKA 03/05/19 Rt patella tendon rupture repair 03/28/19    Social History   Socioeconomic History  . Marital status: Married    Spouse name: Not on file  . Number of children: Not on file  . Years of education: Not on file  . Highest education level: Not on file  Occupational History  . Not on file  Tobacco Use  . Smoking status: Never Smoker  . Smokeless tobacco: Never Used  Vaping Use  . Vaping Use: Never used  Substance and Sexual Activity  . Alcohol use: Yes    Comment: occ 1 glass per week  . Drug use: Never  . Sexual activity: Not on file  Other Topics Concern  . Not on file  Social History Narrative  . Not on file   Social Determinants of Health   Financial Resource Strain:   . Difficulty of Paying Living Expenses: Not on file  Food Insecurity:   . Worried About Charity fundraiser in the Last Year: Not on file  . Ran Out of Food in the Last Year: Not on file  Transportation Needs:   . Film/video editor (Medical): Not on file  . Lack of Transportation (Non-Medical): Not on file  Physical Activity:   . Days of Exercise per Week: Not on file  . Minutes of Exercise per Session: Not on file  Stress:   . Feeling of Stress : Not on file  Social Connections:   . Frequency of Communication with Friends and Family: Not on file  . Frequency of Social Gatherings with Friends and Family: Not on file  . Attends Religious Services: Not on file  . Active Member of Clubs or Organizations: Not on file  . Attends Archivist Meetings: Not on file  . Marital Status: Not on file  Intimate Partner Violence:   . Fear of Current or Ex-Partner: Not on file  . Emotionally Abused: Not on file  . Physically Abused: Not on  file  . Sexually Abused: Not on file    History reviewed. No pertinent family history. No Known Allergies  ? Current Facility-Administered Medications  Medication Dose Route Frequency Provider Last Rate Last Admin  . brimonidine (ALPHAGAN) 0.2 % ophthalmic solution 1 drop  1 drop Right Eye TID Awilda Bill, NP      . ceFEPIme (MAXIPIME) 2 g in sodium chloride 0.9 % 100 mL IVPB  2 g Intravenous Q8H Awilda Bill, NP 200 mL/hr at 03/17/20 0329 2 g at 03/17/20 0329  . chlorhexidine gluconate (MEDLINE KIT) (PERIDEX) 0.12 % solution 15 mL  15 mL Mouth Rinse BID Awilda Bill, NP   15 mL at 03/17/20 0837  . Chlorhexidine Gluconate Cloth 2 % PADS 6 each  6 each Topical Daily Awilda Bill, NP      . docusate (COLACE) 50 MG/5ML liquid 100 mg  100 mg Per Tube BID Awilda Bill, NP   100 mg at 03/17/20 0854  . enoxaparin (LOVENOX) injection 40 mg  40 mg Subcutaneous BID Awilda Bill, NP   40 mg at 03/17/20 0854  . etomidate (AMIDATE) injection   Intravenous PRN Nance Pear, MD   30 mg at 02/24/2020 2141  . famotidine (PEPCID) IVPB 20 mg premix  20 mg Intravenous Q12H Awilda Bill, NP 100 mL/hr at 03/17/20 0900 20 mg at 03/17/20 0900  . fentaNYL (SUBLIMAZE) bolus via infusion 25 mcg  25 mcg Intravenous Q15 min PRN Awilda Bill, NP   25 mcg at 03/15/2020 2209  . fentaNYL (SUBLIMAZE) injection 25 mcg  25 mcg Intravenous Once Awilda Bill, NP      . fentaNYL 2589mg in NS 2517m(1072mml) infusion-PREMIX  25-200 mcg/hr Intravenous Continuous BlaAwilda BillP 15 mL/hr at 03/09/2020 2345 150 mcg/hr at 03/03/2020 2345  . ferrous sulfate tablet 325 mg  325 mg Oral QODHerbert PunP   325 mg at 03/17/20 0855  . hydrocortisone sodium succinate (SOLU-CORTEF) 100 MG injection 50 mg  50 mg Intravenous Q6H KasFlora LippsD   50 mg at 03/17/20 1135  . insulin aspart (novoLOG) injection 0-15 Units  0-15 Units Subcutaneous Q4H [name], DanDreama SaaP      . ipratropium-albuterol  (DUONEB) 0.5-2.5 (3) MG/3ML nebulizer solution 3 mL  3 mL Nebulization Q6H PRN BlaAwilda BillP      . ipratropium-albuterol (DUONEB) 0.5-2.5 (3) MG/3ML nebulizer solution 3 mL  3 mL Nebulization Q6H BlaAwilda BillP   3 mL at 03/17/20 0750  . latanoprost (XALATAN) 0.005 % ophthalmic solution 1 drop  1  drop Right Eye QHS Awilda Bill, NP      . MEDLINE mouth rinse  15 mL Mouth Rinse 10 times per day Awilda Bill, NP   15 mL at 03/17/20 1136  . midazolam (VERSED) injection 1-2 mg  1-2 mg Intravenous Q2H PRN Awilda Bill, NP   2 mg at 03/03/2020 2348  . midazolam (VERSED) injection 2 mg  2 mg Intravenous Once Awilda Bill, NP      . norepinephrine (LEVOPHED) 32m in 255mpremix infusion  2-10 mcg/min Intravenous Continuous KaFlora LippsMD 30 mL/hr at 03/17/20 0456 8 mcg/min at 03/17/20 0456  . nystatin (MYCOSTATIN/NYSTOP) topical powder   Topical BID BlAwilda BillNP   Given at 03/17/20 1136  . polyethylene glycol (MIRALAX / GLYCOLAX) packet 17 g  17 g Oral Daily PRN BlAwilda BillNP      . polyethylene glycol (MIRALAX / GLYCOLAX) packet 17 g  17 g Per Tube Daily BlAwilda BillNP   17 g at 03/17/20 0854  . primidone (MYSOLINE) tablet 250 mg  250 mg Per Tube QPM BlAwilda BillNP      . primidone (MYSOLINE) tablet 375 mg  375 mg Per Tube q morning - 10a BlAwilda BillNP   375 mg at 03/17/20 1135  . propofol (DIPRIVAN) 1000 MG/100ML infusion  5-80 mcg/kg/min Intravenous Titrated BlAwilda BillNP 21.2 mL/hr at 03/17/20 0833 30 mcg/kg/min at 03/17/20 0833  . rocuronium (ZEMURON) injection   Intravenous PRN GoNance PearMD   30 mg at 02/25/2020 2141  . simvastatin (ZOCOR) tablet 20 mg  20 mg Per Tube QPM BlAwilda BillNP      . topiramate (TOPAMAX) tablet 100 mg  100 mg Oral q AM KaFlora LippsMD   100 mg at 03/17/20 [id number] And  . topiramate (TOPAMAX) tablet 50 mg  50 mg Oral Daily KaFlora LippsMD         Abtx:  Anti-infectives (From admission,  onward)   Start     Dose/Rate Route Frequency Ordered Stop   03/09/2020 2215  ceFEPIme (MAXIPIME) 2 g in sodium chloride 0.9 % 100 mL IVPB        2 g 200 mL/hr over 30 Minutes Intravenous Every 8 hours 02/25/2020 2203     03/17/2020 2130  vancomycin (VANCOCIN) IVPB 1000 mg/200 mL premix        1,000 mg 200 mL/hr over 60 Minutes Intravenous  Once 03/04/2020 2129 03/20/2020 2345   02/22/2020 1845  cefTRIAXone (ROCEPHIN) 1 g in sodium chloride 0.9 % 100 mL IVPB        1 g 200 mL/hr over 30 Minutes Intravenous  Once 03/20/2020 1836 03/06/2020 2035      REVIEW OF SYSTEMS:  NA?  Objective:  VITALS:  BP (!) 111/50   Pulse (!) 55   Temp 98.7 F (37.1 C)   Resp 14   Ht _0  (1.702 m)   Wt 126.2 kg   SpO2 100%   BMI 43.58 kg/m  PHYSICAL EXAM:  General:Intubated Head: Normocephalic, without obvious abnormality, atraumatic. Eyes: Conjunctivae clear, anicteric sclerae. Pupils are equal ENT cannot examine Neck: Supple, symmetrical, no adenopathy, thyroid: non tender no carotid bruit and no JVD. Back: did not examine Lungs:b/l air entry Heart: s1s2 Abdomen: Soft,lap scar Foley  Extremities: edema legs Skin: No rashes or lesions. Or bruising Lymph: Cervical, supraclavicular normal. Neurologic: cannot examine Pertinent Labs Lab Results CBC  Component Value Date/Time   WBC 6.8 03/17/2020 0031   RBC 4.99 03/17/2020 0031   HGB 15.2 03/17/2020 0031   HCT 50.5 03/17/2020 0031   PLT 123 (L) 03/17/2020 0031   MCV 101.2 (H) 03/17/2020 0031   MCH 30.5 03/17/2020 0031   MCHC 30.1 03/17/2020 0031   RDW 14.6 03/17/2020 0031   LYMPHSABS 2.0 02/25/2020 1541   MONOABS 0.9 02/25/2020 1541   EOSABS 0.4 02/25/2020 1541   BASOSABS 0.1 02/25/2020 1541    CMP Latest Ref Rng & Units 03/17/2020 03/23/2020 02/27/2020  Glucose 70 - 99 mg/dL 107(H) 104(H) 90  BUN 8 - 23 mg/dL 28(H) 27(H) 17  Creatinine 0.61 - 1.24 mg/dL 0.81 0.69 0.53(L)  Sodium 135 - 145 mmol/L 146(H) 147(H) 142  Potassium 3.5 - 5.1  mmol/L 4.2 3.9 4.2  Chloride 98 - 111 mmol/L 101 103 101  CO2 22 - 32 mmol/L 34(H) 35(H) 35(H)  Calcium 8.9 - 10.3 mg/dL 8.5(L) 8.3(L) 8.0(L)  Total Protein 6.5 - 8.1 g/dL - - -  Total Bilirubin 0.3 - 1.2 mg/dL - - -  Alkaline Phos 38 - 126 U/L - - -  AST 15 - 41 U/L - - -  ALT 0 - 44 U/L - - -   HCO3    Microbiology: Recent Results (from the past 240 hour(s))  SARS Coronavirus 2 by RT PCR (hospital order, performed in [facility] hospital lab) Nasopharyngeal Nasopharyngeal Swab     Status: None   Collection Time: 03/03/2020  6:03 PM   Specimen: Nasopharyngeal Swab  Result Value Ref Range Status   SARS Coronavirus 2 NEGATIVE NEGATIVE Final    Comment: (NOTE) SARS-CoV-2 target nucleic acids are NOT DETECTED.  The SARS-CoV-2 RNA is generally detectable in upper and lower respiratory specimens during the acute phase of infection. The lowest concentration of SARS-CoV-2 viral copies this assay can detect is 250 copies / mL. A negative result does not preclude SARS-CoV-2 infection and should not be used as the sole basis for treatment or other patient management decisions.  A negative result may occur with improper specimen collection / handling, submission of specimen other than nasopharyngeal swab, presence of viral mutation(s) within the areas targeted by this assay, and inadequate number of viral copies (<250 copies / mL). A negative result must be combined with clinical observations, patient history, and epidemiological information.  Fact Sheet for Patients:   StrictlyIdeas.no  Fact Sheet for Healthcare Providers: BankingDealers.co.za  This test is not yet approved or  cleared by the [place] FDA and has been authorized for detection and/or diagnosis of SARS-CoV-2 by FDA under an Emergency Use Authorization (EUA).  This EUA will remain in effect (meaning this test can be used) for the duration of the COVID-19 declaration  under Section 564(b)(1) of the Act, 21 U.S.C. section 360bbb-3(b)(1), unless the authorization is terminated or revoked sooner.  Performed at [facility], [address] [place], [place] [address]   Blood culture (routine x 2)     Status: None (Preliminary result)   Collection Time: 03/07/2020  6:52 PM   Specimen: BLOOD  Result Value Ref Range Status   Specimen Description BLOOD LEFT ARM  Final   Special Requests   Final    BOTTLES DRAWN AEROBIC AND ANAEROBIC Blood Culture adequate volume   Culture   Final    NO GROWTH < 12 HOURS Performed at [facility], [address], [place], [place] [address]    Report Status PENDING  Incomplete  Blood culture (routine x 2)     Status: None (Preliminary result)   Collection Time: 03/04/2020  6:52 PM   Specimen: BLOOD  Result Value Ref Range Status   Specimen Description BLOOD RIGHT AC  Final   Special Requests   Final    BOTTLES DRAWN AEROBIC AND ANAEROBIC Blood Culture results may not be optimal due to an inadequate volume of blood received in culture bottles   Culture   Final    NO GROWTH < 12 HOURS Performed at [facility], [address] [place], [place] [address]    Report Status PENDING  Incomplete  MRSA PCR Screening     Status: None   Collection Time: 03/17/20 12:53 AM   Specimen: Nasopharyngeal  Result Value Ref Range Status   MRSA by PCR NEGATIVE NEGATIVE Final    Comment:        The GeneXpert MRSA Assay (FDA approved for NASAL specimens only), is one component of a comprehensive MRSA colonization surveillance program. It is not intended to diagnose MRSA infection nor to guide or monitor treatment for MRSA infections. Performed at [facility], [address] [place], [place] [address]     IMAGING RESULTS:  I have personally reviewed the films   02/23/2020 ?   01/08/20    03/06/19    Echo Right ventricular systolic function is normal. The right ventricular  size  is normal. There is mildly elevated pulmonary artery systolic  Pressure   Impression/Recommendation  ?Fatigue, weakness, somnolence, myoclonic jerks due to hypercarbia  Acute Hypercarbia/hypercapnc respiratory failure--( likely was chronic)  Etiology ? OSA /Drugs vs neuromuscular issues like myasthenia need to be considered  Has b/l atelectasis - on CXR- I don think this is pneumonia  procalcitonin< 0.10, no leucocytosis or fever    Metabolic alkalosis, increase in pulmonary artery  Pressure and RBBB all noted in June 2021 ( prior to that he did not have metabolic alkolosis) all these are indicative of compensated respiratory acidosis due to hypercarbia. 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  The Urine ESBL .ecoli has been a red herring/diversion as patient himself has been saying he has UTI and being treated multiple times with antibiotics and no improvement   Polypharmacy - most of the meds have sedative effect On primidone, clonazepam,ambien, flexeril, mirtazapine - all likely contributing to his above state He has been taking some of the above meds for essential tremors presecribed by a neurologist and he has not seen him in the past 2 years Need to go back to se   ?Colonized with ESBL e.coli, has renal stone rt  Also has abnormal genital anatomy which makes it difficult for him to give a clean urine sample Okay to give meropenem until blood culture is available  H/O Gastric bypass surgery ?H/O OSA- but since he had gastric bypass he had not used CPAP as he lost weight- recent years he has gained some weight back ___________________________________________________ Discussed the management with intensivist and also with his daughter in great detail Note:  This document was prepared using Dragon voice recognition software and may include unintentional dictation errors.

## 2020-03-17 NOTE — Progress Notes (Signed)
Pharmacy Electrolyte Monitoring Consult:  Pharmacy consulted to assist in monitoring and replacing electrolytes in this 74 y.o. male admitted on 03/02/2020 with Weakness   Labs:  Sodium (mmol/L)  Date Value  03/17/2020 146 (H)   Potassium (mmol/L)  Date Value  03/17/2020 4.2   Magnesium (mg/dL)  Date Value  03/17/2020 2.0   Phosphorus (mg/dL)  Date Value  03/17/2020 3.6   Calcium (mg/dL)  Date Value  03/17/2020 8.5 (L)   Albumin (g/dL)  Date Value  02/25/2020 2.9 (L)    Assessment/Plan: Electrolytes WNL, no replacement indicated. Continue to follow.  Dorena Bodo, PharmD Clinical Pharmacist 03/17/2020 2:55 PM

## 2020-03-18 DIAGNOSIS — I5031 Acute diastolic (congestive) heart failure: Secondary | ICD-10-CM

## 2020-03-18 LAB — CBC WITH DIFFERENTIAL/PLATELET
Abs Immature Granulocytes: 0.01 10*3/uL (ref 0.00–0.07)
Basophils Absolute: 0 10*3/uL (ref 0.0–0.1)
Basophils Relative: 1 %
Eosinophils Absolute: 0 10*3/uL (ref 0.0–0.5)
Eosinophils Relative: 0 %
HCT: 39.5 % (ref 39.0–52.0)
Hemoglobin: 12.6 g/dL — ABNORMAL LOW (ref 13.0–17.0)
Immature Granulocytes: 0 %
Lymphocytes Relative: 27 %
Lymphs Abs: 1.1 10*3/uL (ref 0.7–4.0)
MCH: 31 pg (ref 26.0–34.0)
MCHC: 31.9 g/dL (ref 30.0–36.0)
MCV: 97.1 fL (ref 80.0–100.0)
Monocytes Absolute: 0.4 10*3/uL (ref 0.1–1.0)
Monocytes Relative: 8 %
Neutro Abs: 2.7 10*3/uL (ref 1.7–7.7)
Neutrophils Relative %: 64 %
Platelets: 96 10*3/uL — ABNORMAL LOW (ref 150–400)
RBC: 4.07 MIL/uL — ABNORMAL LOW (ref 4.22–5.81)
RDW: 14.5 % (ref 11.5–15.5)
WBC: 4.2 10*3/uL (ref 4.0–10.5)
nRBC: 0 % (ref 0.0–0.2)

## 2020-03-18 LAB — RENAL FUNCTION PANEL
Albumin: 2.5 g/dL — ABNORMAL LOW (ref 3.5–5.0)
Anion gap: 8 (ref 5–15)
BUN: 23 mg/dL (ref 8–23)
CO2: 30 mmol/L (ref 22–32)
Calcium: 7.9 mg/dL — ABNORMAL LOW (ref 8.9–10.3)
Chloride: 105 mmol/L (ref 98–111)
Creatinine, Ser: 0.59 mg/dL — ABNORMAL LOW (ref 0.61–1.24)
GFR calc Af Amer: >60 mL/min (ref >60)
GFR calc non Af Amer: >60 mL/min (ref >60)
Glucose, Bld: 160 mg/dL — ABNORMAL HIGH (ref 70–99)
Phosphorus: 3.7 mg/dL (ref 2.5–4.6)
Potassium: 4 mmol/L (ref 3.5–5.1)
Sodium: 143 mmol/L (ref 135–145)

## 2020-03-18 LAB — GLUCOSE, CAPILLARY
Glucose-Capillary: 138 mg/dL — ABNORMAL HIGH (ref 70–99)
Glucose-Capillary: 136 mg/dL — ABNORMAL HIGH (ref 70–99)
Glucose-Capillary: 153 mg/dL — ABNORMAL HIGH (ref 70–99)
Glucose-Capillary: 129 mg/dL — ABNORMAL HIGH (ref 70–99)

## 2020-03-18 LAB — MAGNESIUM: Magnesium: 2 mg/dL (ref 1.7–2.4)

## 2020-03-18 MED ORDER — GLYCOPYRROLATE 1 MG PO TABS
1.0000 mg | ORAL_TABLET | ORAL | Status: DC | PRN
  Filled 2020-03-18: qty 1

## 2020-03-18 MED ORDER — MORPHINE BOLUS VIA INFUSION
2.0000 mg | INTRAVENOUS | Status: DC | PRN
  Administered 2020-03-18 (×3): 2 mg via INTRAVENOUS
  Filled 2020-03-18: qty 2

## 2020-03-18 MED ORDER — ORAL CARE MOUTH RINSE
15.0000 mL | Freq: Two times a day (BID) | OROMUCOSAL | Status: DC
  Administered 2020-03-18: 15 mL via OROMUCOSAL

## 2020-03-18 MED ORDER — DIPHENHYDRAMINE HCL 50 MG/ML IJ SOLN
25.0000 mg | INTRAMUSCULAR | Status: DC | PRN
  Administered 2020-03-18: 25 mg via INTRAVENOUS
  Filled 2020-03-18: qty 1

## 2020-03-18 MED ORDER — GLYCOPYRROLATE 0.2 MG/ML IJ SOLN: 0.2000 mg | INTRAMUSCULAR | Status: DC | PRN

## 2020-03-18 MED ORDER — GLYCOPYRROLATE 0.2 MG/ML IJ SOLN
0.2000 mg | INTRAMUSCULAR | Status: DC | PRN
  Administered 2020-03-18 – 2020-03-19 (×3): 0.2 mg via INTRAVENOUS
  Filled 2020-03-18 (×3): qty 1

## 2020-03-18 MED ORDER — MORPHINE 100MG IN NS 100ML (1MG/ML) PREMIX INFUSION
0.0000 mg/h | INTRAVENOUS | Status: DC
  Administered 2020-03-18: 10 mg/h via INTRAVENOUS
  Administered 2020-03-18: 8 mg/h via INTRAVENOUS
  Filled 2020-03-18 (×2): qty 100

## 2020-03-18 MED ORDER — ACETAMINOPHEN 325 MG PO TABS: 650.0000 mg | ORAL_TABLET | Freq: Four times a day (QID) | ORAL | Status: DC | PRN

## 2020-03-18 MED ORDER — MIDAZOLAM HCL 2 MG/2ML IJ SOLN
2.0000 mg | INTRAMUSCULAR | Status: DC | PRN
  Administered 2020-03-18: 2 mg via INTRAVENOUS

## 2020-03-18 MED ORDER — DEXTROSE 5 % IV SOLN: INTRAVENOUS | Status: DC

## 2020-03-18 MED ORDER — ACETAMINOPHEN 650 MG RE SUPP: 650.0000 mg | Freq: Four times a day (QID) | RECTAL | Status: DC | PRN

## 2020-03-18 MED ORDER — POLYVINYL ALCOHOL 1.4 % OP SOLN
1.0000 [drp] | Freq: Four times a day (QID) | OPHTHALMIC | Status: DC | PRN
  Filled 2020-03-18: qty 15

## 2020-03-18 NOTE — Progress Notes (Signed)
ID Pt is now comfort care. ID will sign off

## 2020-03-18 NOTE — Progress Notes (Signed)
74 y/o M w/ hx of diastolic CHF, pulmonary HTN who was intubated for respiratory failure and subsequently made comfort care. Pt was extubated and now on comfort care only w/ IV morphine drip. Hospitalist service will take over care tomorrow.  This is non- billable note.

## 2020-03-18 NOTE — Progress Notes (Signed)
Pt extubated to comfort care without complications.  

## 2020-03-18 NOTE — Progress Notes (Signed)
Pt intubated, responds to pain. Unable to follow commands. Foley intact. WUA deferred per MD. Family met with MD to talk about goals of care. Plan for comfort care per MD.  

## 2020-03-18 NOTE — Progress Notes (Signed)
GOALS OF CARE DISCUSSION  The Clinical status was relayed to family in detail. Daughters and Wife  Updated and notified of patients medical condition.  Patient remains unresponsive and will not open eyes to command.    Patient is having a weak cough and struggling to remove secretions.   patient with increased WOB and using accessory muscles to breathe  Explained to family course of therapy and the modalities     Patient with Progressive multiorgan failure with very low chance of meaningful recovery despite all aggressive and optimal medical therapy. Patient is in the Dying  Process associated with Suffering.  Family understands the situation.  They have consented and agreed to DNR/DNI and would like to proceed with Comfort care measures.  Family are satisfied with Plan of action and management. All questions answered  Additional CC time 32 mins   [place] Pirie Patricia Pesa, M.D.  [facility]  Medical Director [place] Director [facility] Cardio-Pulmonary Department

## 2020-03-18 NOTE — Progress Notes (Signed)
Pharmacy Electrolyte Monitoring Consult:  Pharmacy consulted to assist in monitoring and replacing electrolytes in this 74 y.o. male admitted on 03/17/2020 with Weakness   Labs:  Sodium (mmol/L)  Date Value  03/18/2020 143   Potassium (mmol/L)  Date Value  03/18/2020 4.0   Magnesium (mg/dL)  Date Value  03/18/2020 2.0   Phosphorus (mg/dL)  Date Value  03/18/2020 3.7   Calcium (mg/dL)  Date Value  03/18/2020 7.9 (L)   Albumin (g/dL)  Date Value  03/18/2020 2.5 (L)    Assessment/Plan: Electrolytes WNL, no replacement indicated. Continue to follow.  Dorena Bodo, PharmD Clinical Pharmacist 03/18/2020 1:37 PM

## 2020-03-18 NOTE — Progress Notes (Signed)
Morphine gtt and bolus administered for comfort. Versed and robinal also administered for pt comfort. Family at bedside. Will continue to monitor.

## 2020-03-18 NOTE — Progress Notes (Signed)
CRITICAL CARE NOTE  8/24 admitted for severe resp failure and septic shock 8/25 remains on vent 8/26 remains on Vent, end stage dCHF   CC  follow up respiratory failure  SUBJECTIVE Patient remains critically ill Prognosis is guarded Talked with daughter-patient has declining and suffering for a long time Patient does NOT want to live on machines and does not want to be on ventilator, I addressed CODE status-they will meet for   GOALS OF CARE DISCUSSION  The Clinical status was relayed to family in detail. Updated and notified of patients medical condition.  Patient remains unresponsive and will not open eyes to command.   Explained to family course of therapy and the modalities  Will obtain Palliative      BP (!) 102/49   Pulse (!) 54   Temp 98 F (36.7 C) (Axillary)   Resp 14   Ht 5\' 7"  (1.702 m)   Wt 129.8 kg   SpO2 97%   BMI 44.82 kg/m    I/O last 3 completed shifts: In: 3769.9 [I.V.:1931.9; IV Piggyback:1838.1] Out: 1600 [Urine:1600] Total I/O In: 309.1 [I.V.:209.1; IV Piggyback:100] Out: 100 [Urine:100]  SpO2: 97 % O2 Flow Rate (L/min): 2 L/min FiO2 (%): 30 %  Estimated body mass index is 44.82 kg/m as calculated from the following:   Height as of this encounter: 5\' 7"  (1.702 m).   Weight as of this encounter: 129.8 kg.  SIGNIFICANT EVENTS   REVIEW OF SYSTEMS  PATIENT IS UNABLE TO PROVIDE COMPLETE REVIEW OF SYSTEMS DUE TO SEVERE CRITICAL ILLNESS        PHYSICAL EXAMINATION:  GENERAL:critically ill appearing, +resp distress HEAD: Normocephalic, atraumatic.  EYES: Pupils equal, round, reactive to light.  No scleral icterus.  MOUTH: Moist mucosal membrane. NECK: Supple.  PULMONARY: +rhonchi, +wheezing CARDIOVASCULAR: S1 and S2. Regular rate and rhythm. No murmurs, rubs, or gallops.  GASTROINTESTINAL: Soft, nontender, -distended.  Positive bowel sounds.   MUSCULOSKELETAL+edema.  NEUROLOGIC: obtunded,  GCS<8 SKIN:intact,warm,dry  MEDICATIONS: I have reviewed all medications and confirmed regimen as documented   CULTURE RESULTS   Recent Results (from the past 240 hour(s))  SARS Coronavirus 2 by RT PCR (hospital order, performed in [facility] hospital lab) Nasopharyngeal Nasopharyngeal Swab     Status: None   Collection Time: 03/13/2020  6:03 PM   Specimen: Nasopharyngeal Swab  Result Value Ref Range Status   SARS Coronavirus 2 NEGATIVE NEGATIVE Final    Comment: (NOTE) SARS-CoV-2 target nucleic acids are NOT DETECTED.  The SARS-CoV-2 RNA is generally detectable in upper and lower respiratory specimens during the acute phase of infection. The lowest concentration of SARS-CoV-2 viral copies this assay can detect is 250 copies / mL. A negative result does not preclude SARS-CoV-2 infection and should not be used as the sole basis for treatment or other patient management decisions.  A negative result may occur with improper specimen collection / handling, submission of specimen other than nasopharyngeal swab, presence of viral mutation(s) within the areas targeted by this assay, and inadequate number of viral copies (<250 copies / mL). A negative result must be combined with clinical observations, patient history, and epidemiological information.  Fact Sheet for Patients:   StrictlyIdeas.no  Fact Sheet for Healthcare Providers: BankingDealers.co.za  This test is not yet approved or  cleared by the [place] FDA and has been authorized for detection and/or diagnosis of SARS-CoV-2 by FDA under an Emergency Use Authorization (EUA).  This EUA will remain in effect (meaning this test can be used)  for the duration of the COVID-19 declaration under Section 564(b)(1) of the Act, 21 U.S.C. section 360bbb-3(b)(1), unless the authorization is terminated or revoked sooner.  Performed at [facility], [address]  [place], [place] [address]   Blood culture (routine x 2)     Status: None (Preliminary result)   Collection Time: 03/20/2020  6:52 PM   Specimen: BLOOD  Result Value Ref Range Status   Specimen Description BLOOD LEFT ARM  Final   Special Requests   Final    BOTTLES DRAWN AEROBIC AND ANAEROBIC Blood Culture adequate volume   Culture   Final    NO GROWTH 2 DAYS Performed at [facility], [address] [place], [place] [address]    Report Status PENDING  Incomplete  Blood culture (routine x 2)     Status: None (Preliminary result)   Collection Time: 03/14/2020  6:52 PM   Specimen: BLOOD  Result Value Ref Range Status   Specimen Description BLOOD RIGHT AC  Final   Special Requests   Final    BOTTLES DRAWN AEROBIC AND ANAEROBIC Blood Culture results may not be optimal due to an inadequate volume of blood received in culture bottles   Culture   Final    NO GROWTH 2 DAYS Performed at [facility], [address] [place], [place] [address]    Report Status PENDING  Incomplete  MRSA PCR Screening     Status: None   Collection Time: 03/17/20 12:53 AM   Specimen: Nasopharyngeal  Result Value Ref Range Status   MRSA by PCR NEGATIVE NEGATIVE Final    Comment:        The GeneXpert MRSA Assay (FDA approved for NASAL specimens only), is one component of a comprehensive MRSA colonization surveillance program. It is not intended to diagnose MRSA infection nor to guide or monitor treatment for MRSA infections. Performed at [facility], [place], [place] [address]       BMP Latest Ref Rng & Units 03/18/2020 03/17/2020 02/26/2020  Glucose 70 - 99 mg/dL 160(H) 107(H) 104(H)  BUN 8 - 23 mg/dL 23 28(H) 27(H)  Creatinine 0.61 - 1.24 mg/dL 0.59(L) 0.81 0.69  Sodium 135 - 145 mmol/L 143 146(H) 147(H)  Potassium 3.5 - 5.1 mmol/L 4.0 4.2 3.9  Chloride 98 - 111 mmol/L 105 101 103  CO2 22 - 32 mmol/L 30 34(H) 35(H)  Calcium 8.9 - 10.3 mg/dL 7.9(L) 8.5(L)  8.3(L)       Indwelling Urinary Catheter continued, requirement due to   Reason to continue Indwelling Urinary Catheter strict Intake/Output monitoring for hemodynamic instability         Ventilator continued, requirement due to severe respiratory failure   Ventilator Sedation RASS 0 to -2      ASSESSMENT AND PLAN SYNOPSIS  74 yo white male with acute and severe hypoxic resp failure due to ?pneumonia and with progressive decline due to untreated OSA with morbid obesity with decompensated diastolic heart failure and Pulm HTN   Severe ACUTE Hypoxic and Hypercapnic Respiratory Failure -continue Full MV support -continue Bronchodilator Therapy -Wean Fio2 and PEEP as tolerated -VAP/VENT bundle implementation  ACUTE DIASOLIC CARDIAC FAILURE- -oxygen as needed -Lasix as tolerated   Morbid obesity, possible OSA.   Will certainly impact respiratory mechanics, ventilator weaning Suspect will need to consider additional PEEP     NEUROLOGY - intubated and sedated - minimal sedation to achieve a RASS goal: -1 Wake up assessment pending  CARDIAC ICU monitoring  ID ESBl UTI -continue IV abx as prescibed -follow up cultures  GI GI PROPHYLAXIS as indicated   DIET-->TF's as tolerated Constipation protocol as indicated  ENDO - will use ICU hypoglycemic\Hyperglycemia protocol if indicated     ELECTROLYTES -follow labs as needed -replace as needed -pharmacy consultation and following   DVT/GI PRX ordered and assessed TRANSFUSIONS AS NEEDED MONITOR FSBS I Assessed the need for Labs I Assessed the need for Foley I Assessed the need for Central Venous Line Family Discussion when available I Assessed the need for Mobilization I made an Assessment of medications to be adjusted accordingly Safety Risk assessment completed   CASE DISCUSSED IN MULTIDISCIPLINARY ROUNDS WITH ICU TEAM  Critical Care Time devoted to patient care services described in this note  is 32 minutes.   Overall, patient is critically ill, prognosis is guarded.  Patient with Multiorgan failure and at high risk for cardiac arrest and death.    Recommend DNR status and one way extubation  Corrin Parker, M.D.  [facility]  Medical Director [place] Director [facility] Cardio-Pulmonary Department

## 2020-03-18 NOTE — Progress Notes (Signed)
     Referral received for Curtis Alexander :goals of care discussion. Chart reviewed and updates received from RN. Patient assessed. Remains intubated and unresponsive. Attempted to contact patient's daughter, Curtis Alexander on number listed. Unable to reach. Voicemail left with contact information given.   PMT will re-attempt to contact family at a later time/date. Detailed note and recommendations to follow once GOC has been completed.   Thank you for your referral and allowing PMT to assist in Mr./Mrs. Curtis Alexander's care.   Alda Lea, AGPCNP-BC Palliative Medicine Team  Phone: [phone] Pager: [phone] Amion: N. Cousar   NO CHARGE

## 2020-03-20 LAB — URINE CULTURE: Culture: 90000 — AB

## 2020-03-21 LAB — CULTURE, BLOOD (ROUTINE X 2)
Culture: NO GROWTH
Culture: NO GROWTH
Special Requests: ADEQUATE

## 2020-03-23 ENCOUNTER — Telehealth: Payer: Self-pay

## 2020-03-23 NOTE — Telephone Encounter (Signed)
Curtis Alexander from [facility] called to inform that patient expired on 04/17/2020.

## 2020-03-24 NOTE — Progress Notes (Signed)
Pt passed away with family at bedside and pronounced by Darlyn Chamber, RN and this RN. Md notified Jimmye Norman, Junie Spencer)  Family declined chaplain visit. CDS called and released at 0742, referral # [id number].

## 2020-03-24 NOTE — Death Summary Note (Addendum)
Death Summary  Curtis Alexander JME:[id number] DOB: February 27, 1946 DOA: April 06, 2020  PCP: Curtis Alexander   Admit date: 2020/04/06 Date of Death: 09-Apr-2020  Final Diagnoses:  Active Problems:   Acute on chronic respiratory failure with hypercapnia (HCC)   Final Dx are below in Dr. 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  He presented to [facility] ER on April 07, 2023 from infectious disease office per MD recommendations with hypoxia and fatigue/ weakness.  Upon arrival to the ER O2 sats were in the 80's, therefore pt placed on 3L O2.  Pt also hypotensive with sbp 80-90's requiring 2L NS bolus. Lab results revealed Na+ 147, CO2 35, glucose 104, troponin 36, pct <0.10, lactic acid 0.9, ammonia 48, and platelets 137.  CXR concerning for pneumonia, pt received ceftriaxone and vancomycin.  ABG revealed pH 7.13/pCO2 >120/pO2 129/bicarb 41.2, therefore pt required mechanical intubation.  PCCM contacted for ICU admission.   Hospital Course:  Hospital Course was taken from Dr. [name] as I never saw the pt. The pt passed away before I could see the pt.   74 yo white male with acute and severe hypoxic resp failure due to ?pneumonia and with progressive decline due to untreated OSA with morbid obesity with decompensated diastolic heart failure and Pulm HTN   Severe ACUTE Hypoxic and Hypercapnic Respiratory Failure -continue Full MV support -continue Bronchodilator Therapy -Wean Fio2 and PEEP as tolerated -VAP/VENT bundle implementation  ACUTE DIASOLIC CARDIAC FAILURE- -oxygen as needed -Lasix as tolerated   Morbid obesity, possible  OSA.  Will certainly impact respiratory mechanics, ventilator weaning Suspect will need to consider additional PEEP    NEUROLOGY - intubated and sedated - minimal sedation to achieve a RASS goal: -1 Wake up assessment pending    CARDIAC ICU monitoring  ID ESBl UTI -continue IV abx as prescibed -follow up cultures  GI GI PROPHYLAXIS as indicated   DIET-->TF's as tolerated Constipation protocol as indicated  ENDO - will use ICU hypoglycemic\Hyperglycemia protocol if indicated    ELECTROLYTES -follow labs as needed -replace as needed -pharmacy consultation and following   GOALS OF CARE DISCUSSION as per Dr. [name]  The Clinical status was relayed to family in detail. Daughters and Wife  Updated and notified of patients medical condition.  Patient remains unresponsive and will not open eyes to command.   Patient is having a weak cough and struggling to remove secretions. patient with increased WOB and using accessory muscles to breathe  Explained to family course of therapy and the modalities   Patient with Progressive multiorgan failure with very low chance of meaningful recovery despite all aggressive and optimal medical therapy. Patient is in the Dying  Process associated with Suffering.  Family understands the situation.  They have consented and agreed to DNR/DNI and would like to proceed with Comfort care measures.  Family are satisfied with Plan of action and management. All questions answered   Also, pt evidently had septic shock from pneumonia which was present on admission as well as acute respiratory failure & hypotension requiring pressor support.   This is a non-billable note as I never saw the pt as the pt passed away before I  saw the pt.    Time: 0735  Signed:  Wyvonnia Alexander  [facility] Apr 15, 2020, 2:14 PM

## 2020-03-24 DEATH — deceased

## 2020-03-25 ENCOUNTER — Other Ambulatory Visit: Payer: Medicare Other | Admitting: Urology

## 2020-10-06 IMAGING — DX PORTABLE RIGHT KNEE - 1-2 VIEW
2 series · 3 of 3 positions shown · non-contrast
Comparison: None.

CLINICAL DATA: Postoperative for knee arthroplasty

EXAM:
PORTABLE RIGHT KNEE - 1-2 VIEW

[Series 2: knee lat · 0.14mm/px · 2 of 2 slices shown]
[im 1/2]
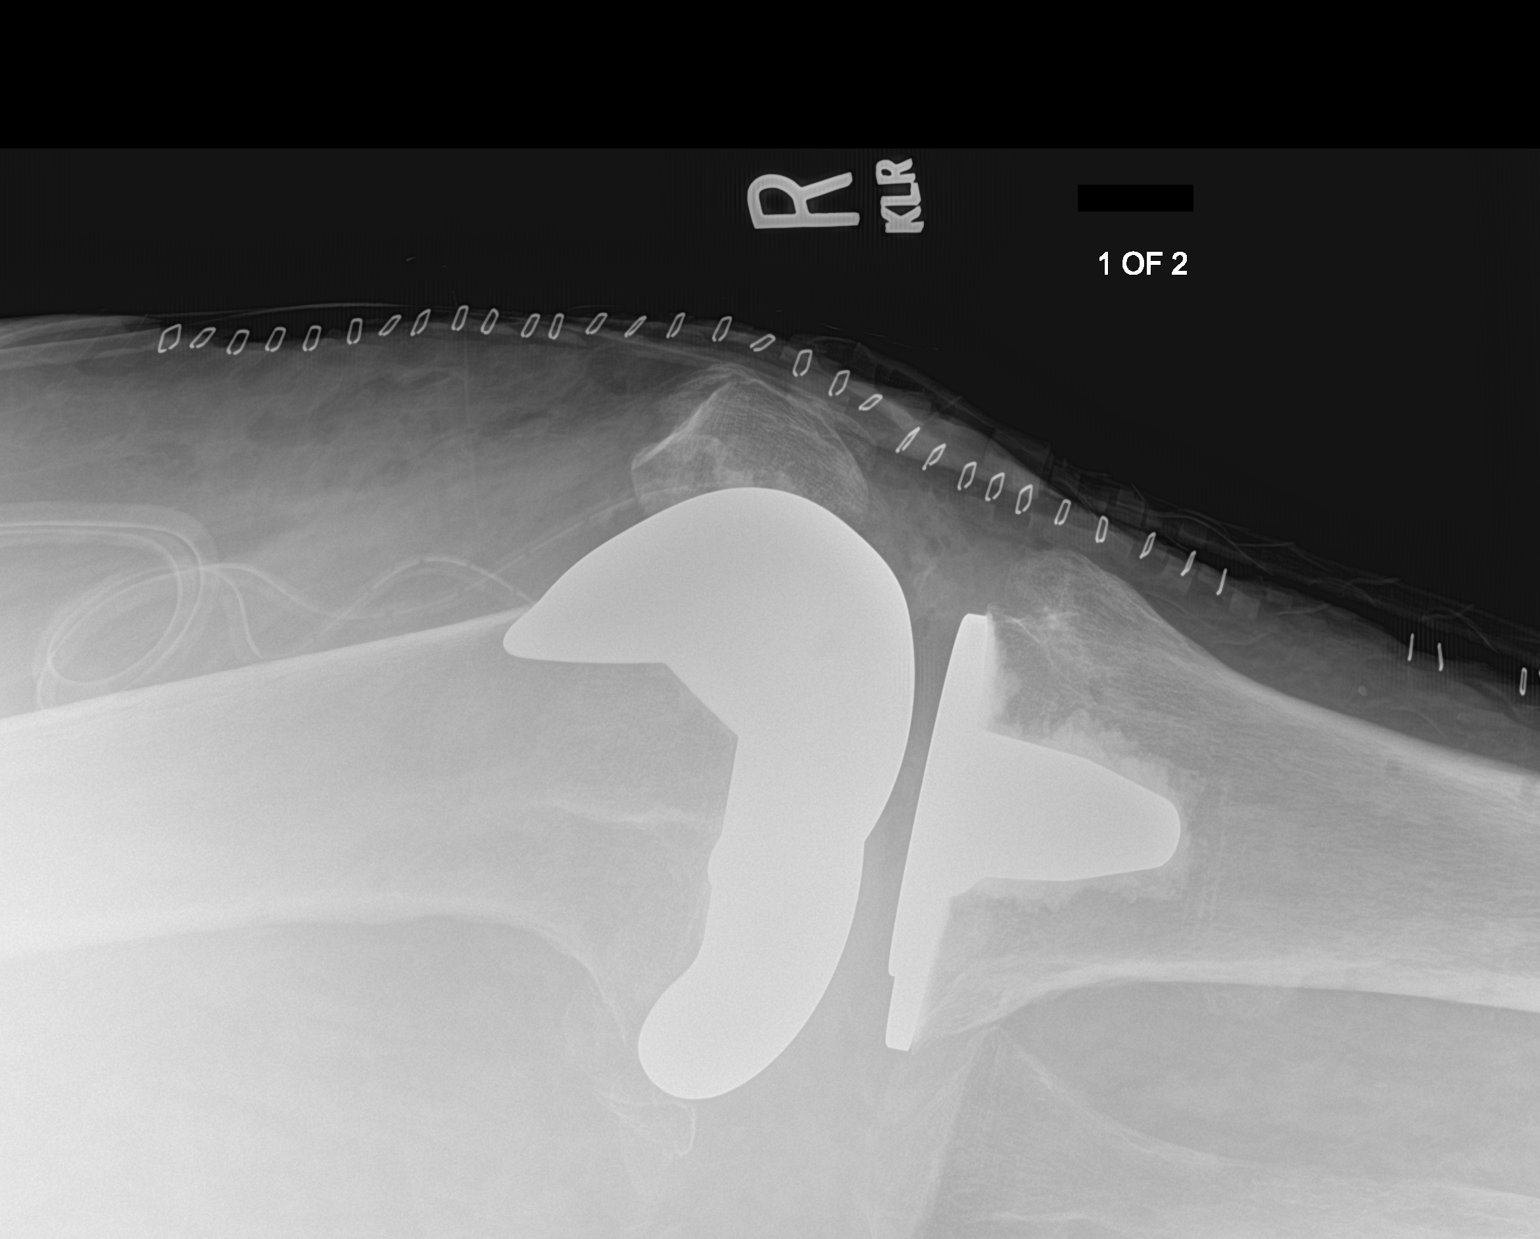
[im 2/2]
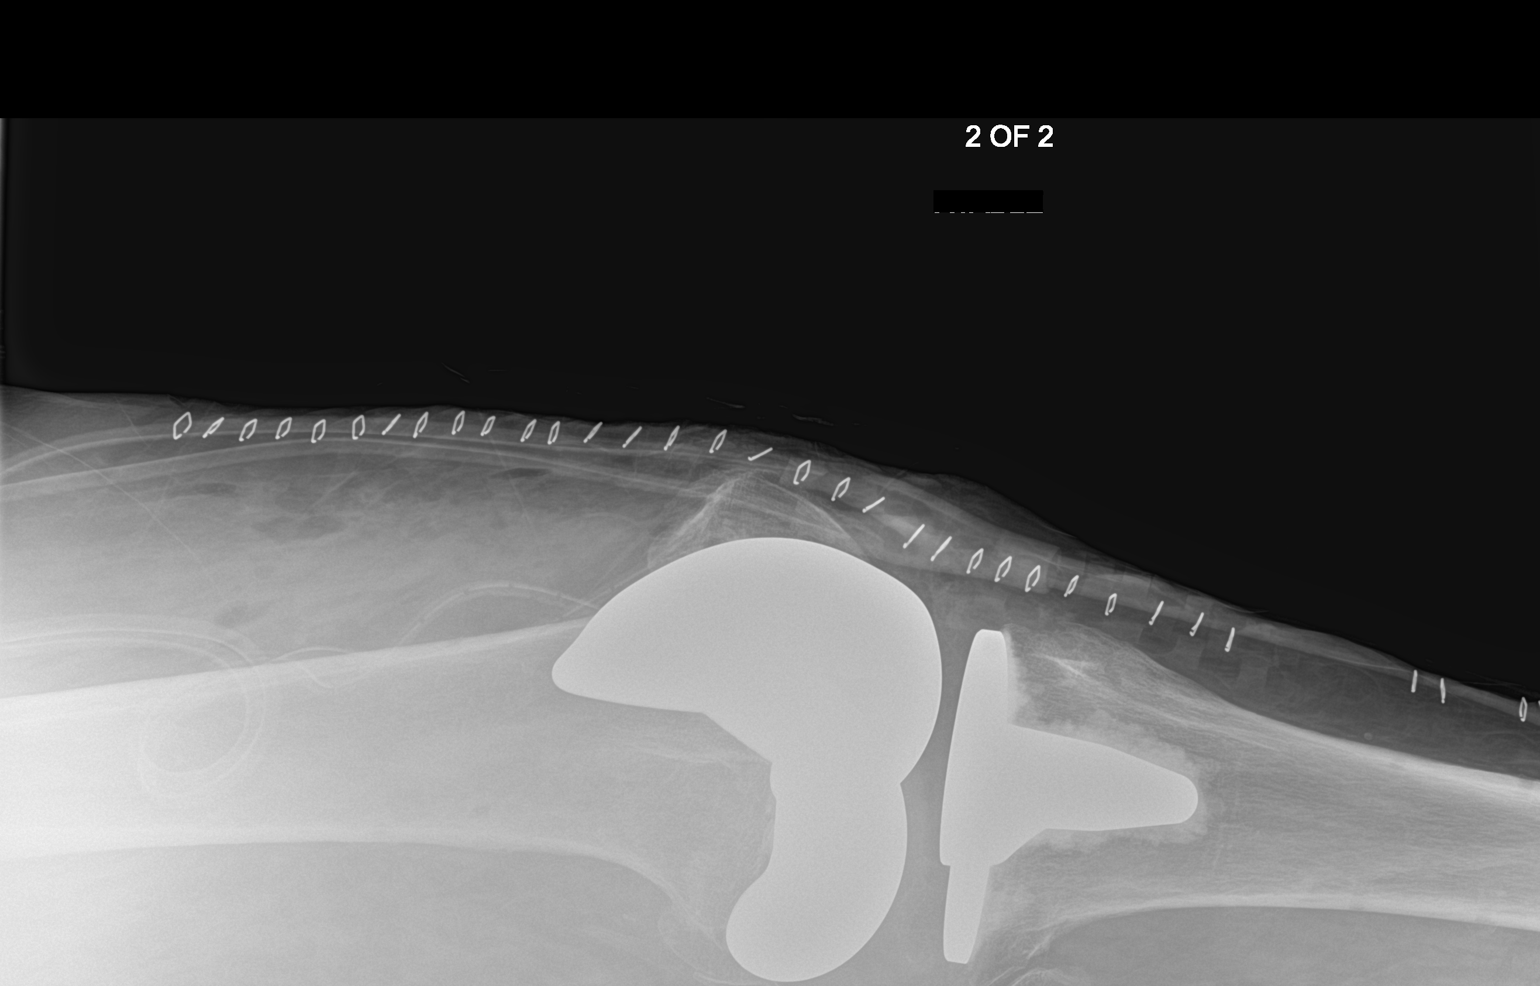

[knee ap]
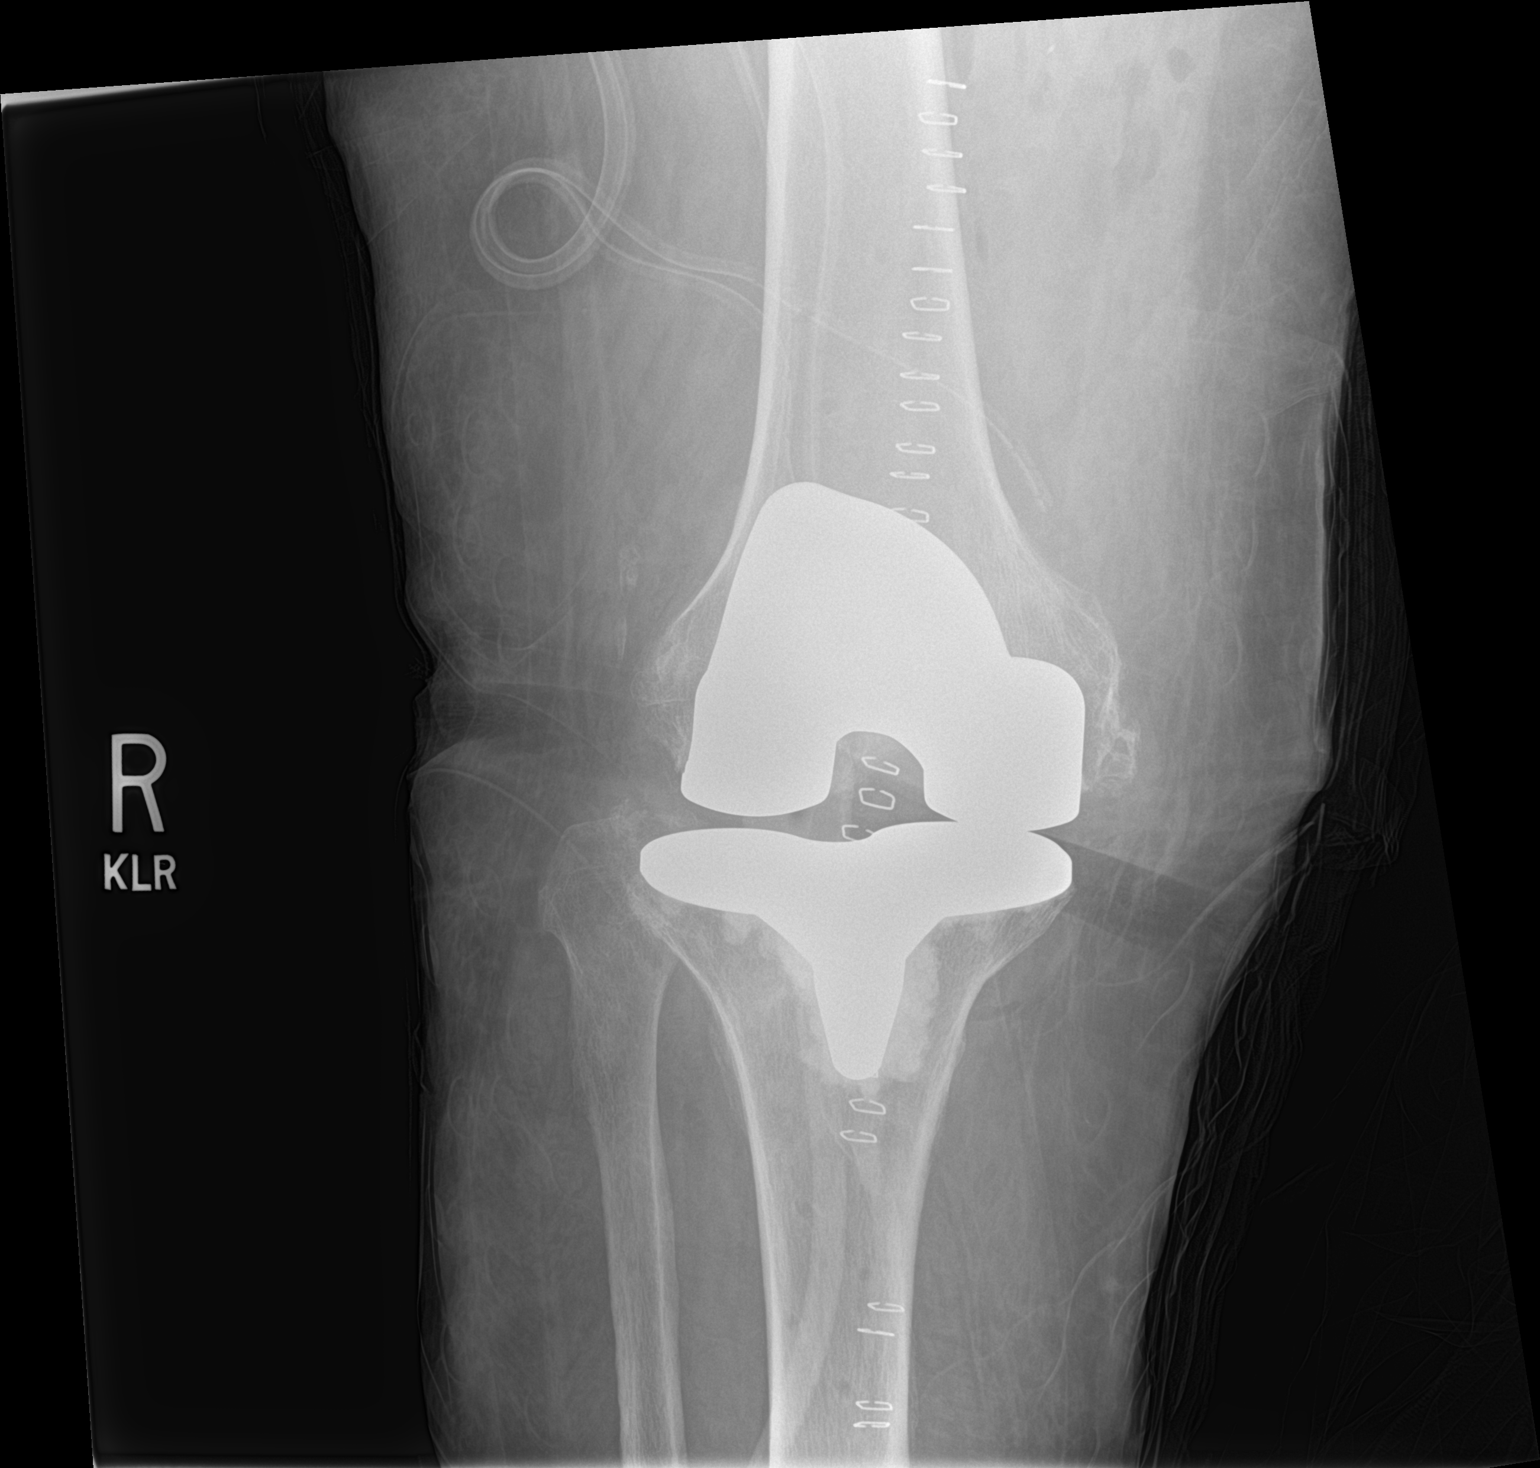

[3 of 3 positions shown; findings below may reference images not displayed]

FINDINGS: Status post right total knee arthroplasty with well-positioned right
distal femoral and right proximal tibial prostheses. Surgical drain
terminates in the suprapatellar region. No acute osseous fracture.
No dislocation. No suspicious focal osseous lesions. Vertical skin
staples anteriorly in the midline. Expected soft tissue swelling and
gas anteriorly about the right knee joint.
IMPRESSION: Satisfactory immediate postoperative appearance status post right
total knee arthroplasty.

## 2020-10-07 IMAGING — DX PORTABLE CHEST - 1 VIEW
1 series · 2 of 2 positions shown · non-contrast
Comparison: None.

CLINICAL DATA: Dyspnea

EXAM:
PORTABLE CHEST 1 VIEW

[Series 1: chest ap · 0.14mm/px · 2 of 2 slices shown]
[im 1/2]
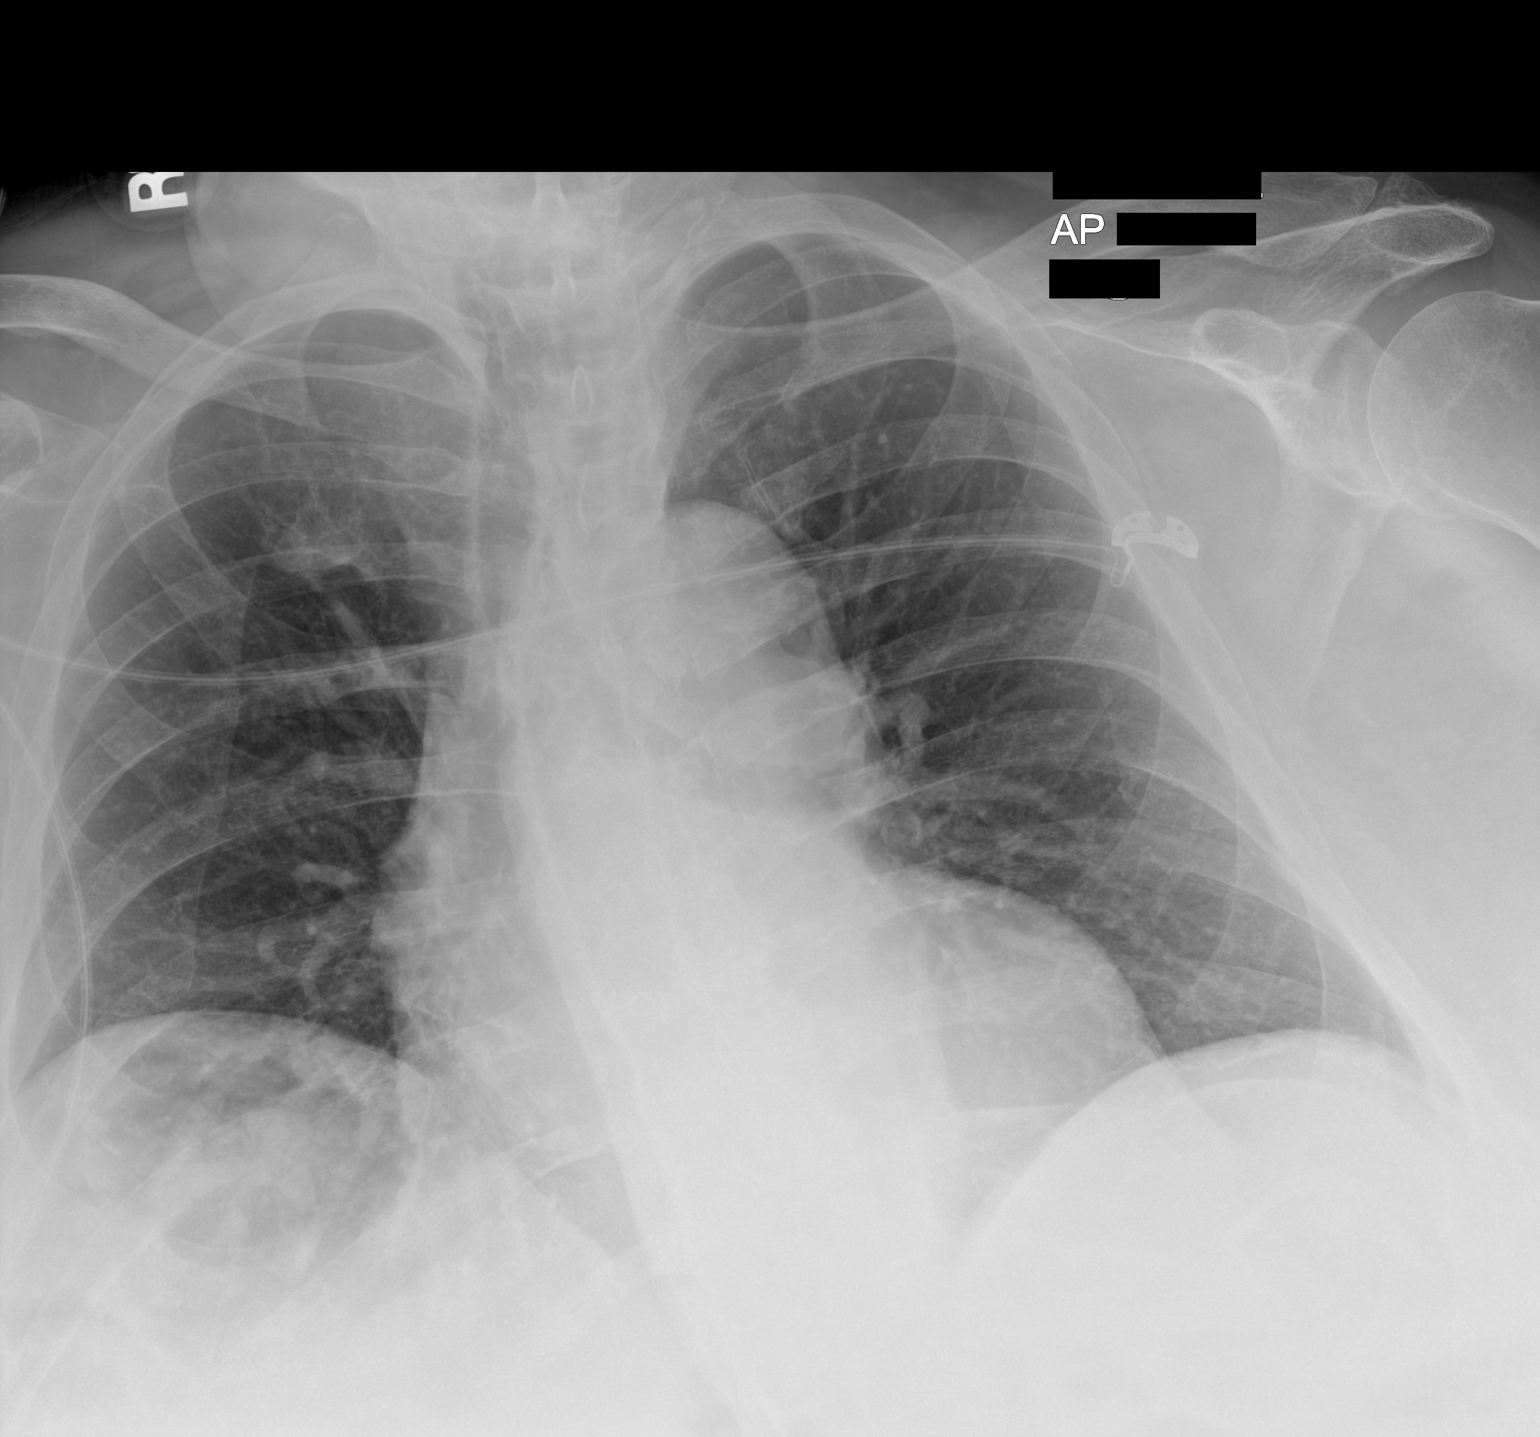
[im 2/2]
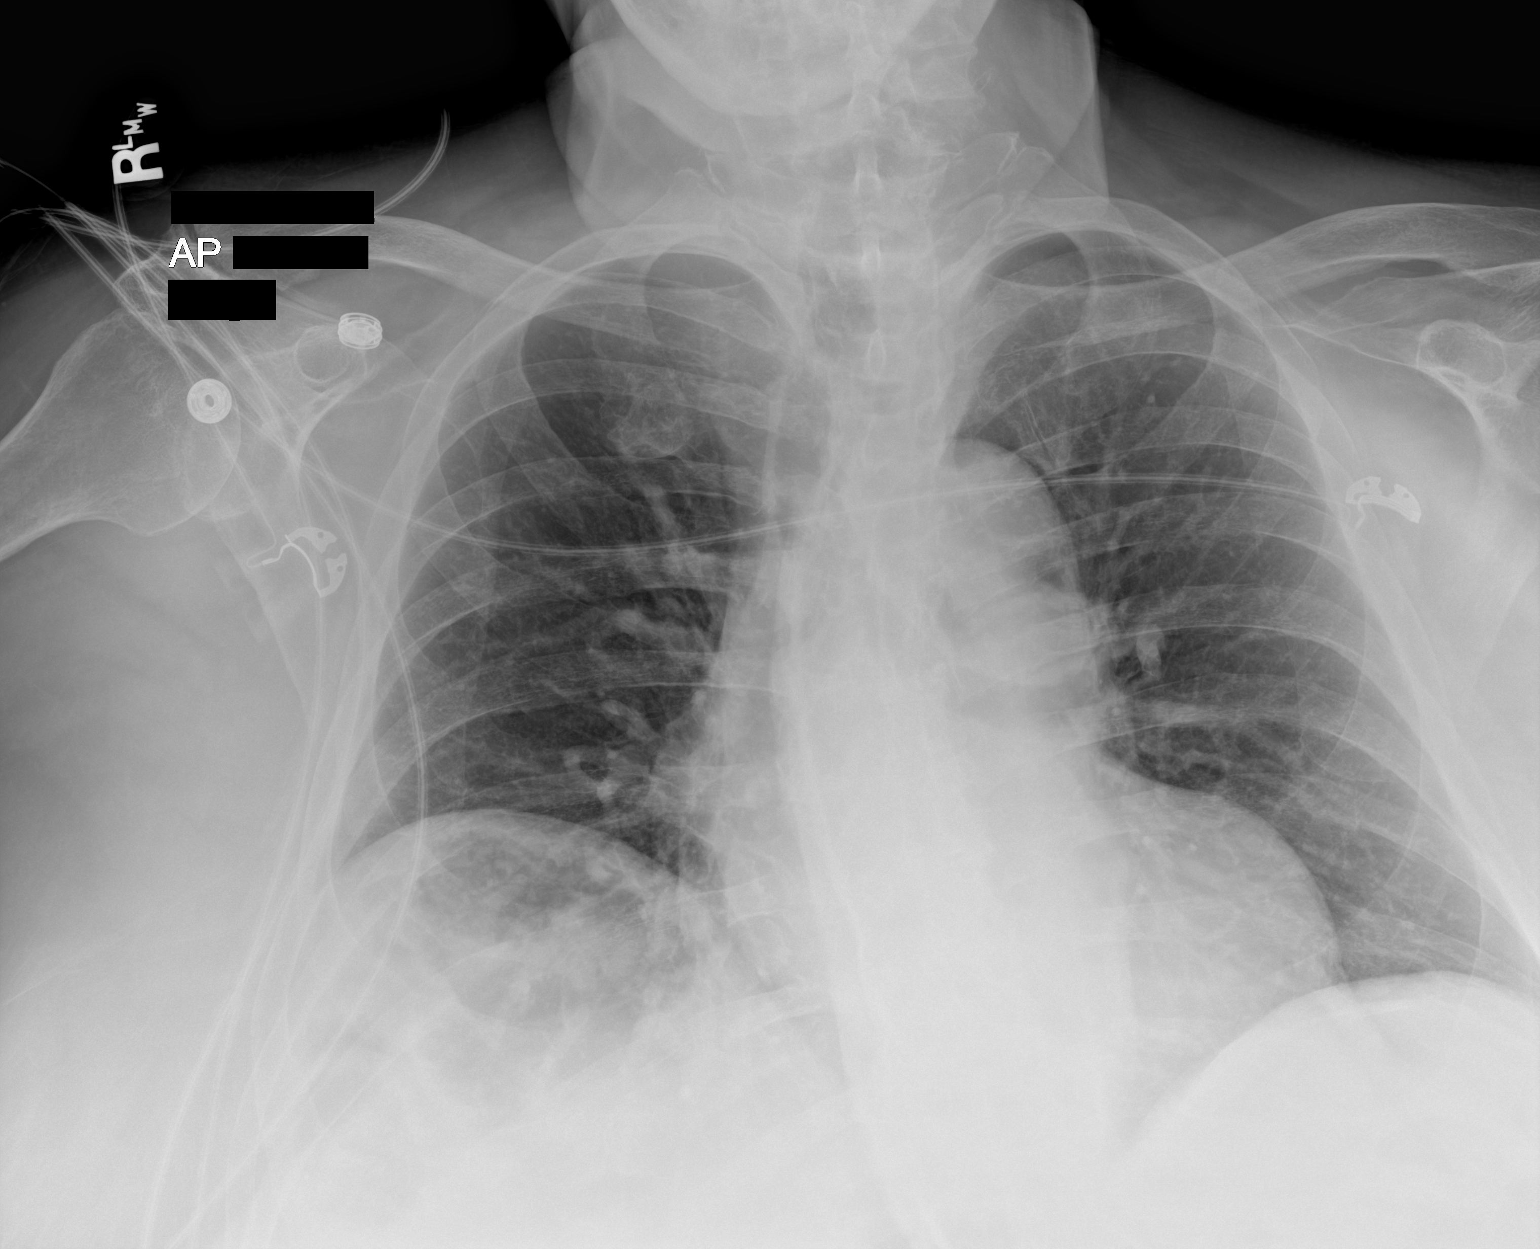

[2 of 2 positions shown; findings below may reference images not displayed]

FINDINGS: No focal opacity or pleural effusion. Normal heart size. No
pneumothorax. Slight elevation of right diaphragm.: Inter
positioning beneath the right diaphragm.
IMPRESSION: No active disease.

## 2021-10-18 IMAGING — DX DG ABDOMEN 1V
1 series · 1 of 1 positions shown · non-contrast
Comparison: CT 02/25/2020

CLINICAL DATA: Intubation and OG tube placement.

EXAM:
ABDOMEN - 1 VIEW

[abdomen supine]
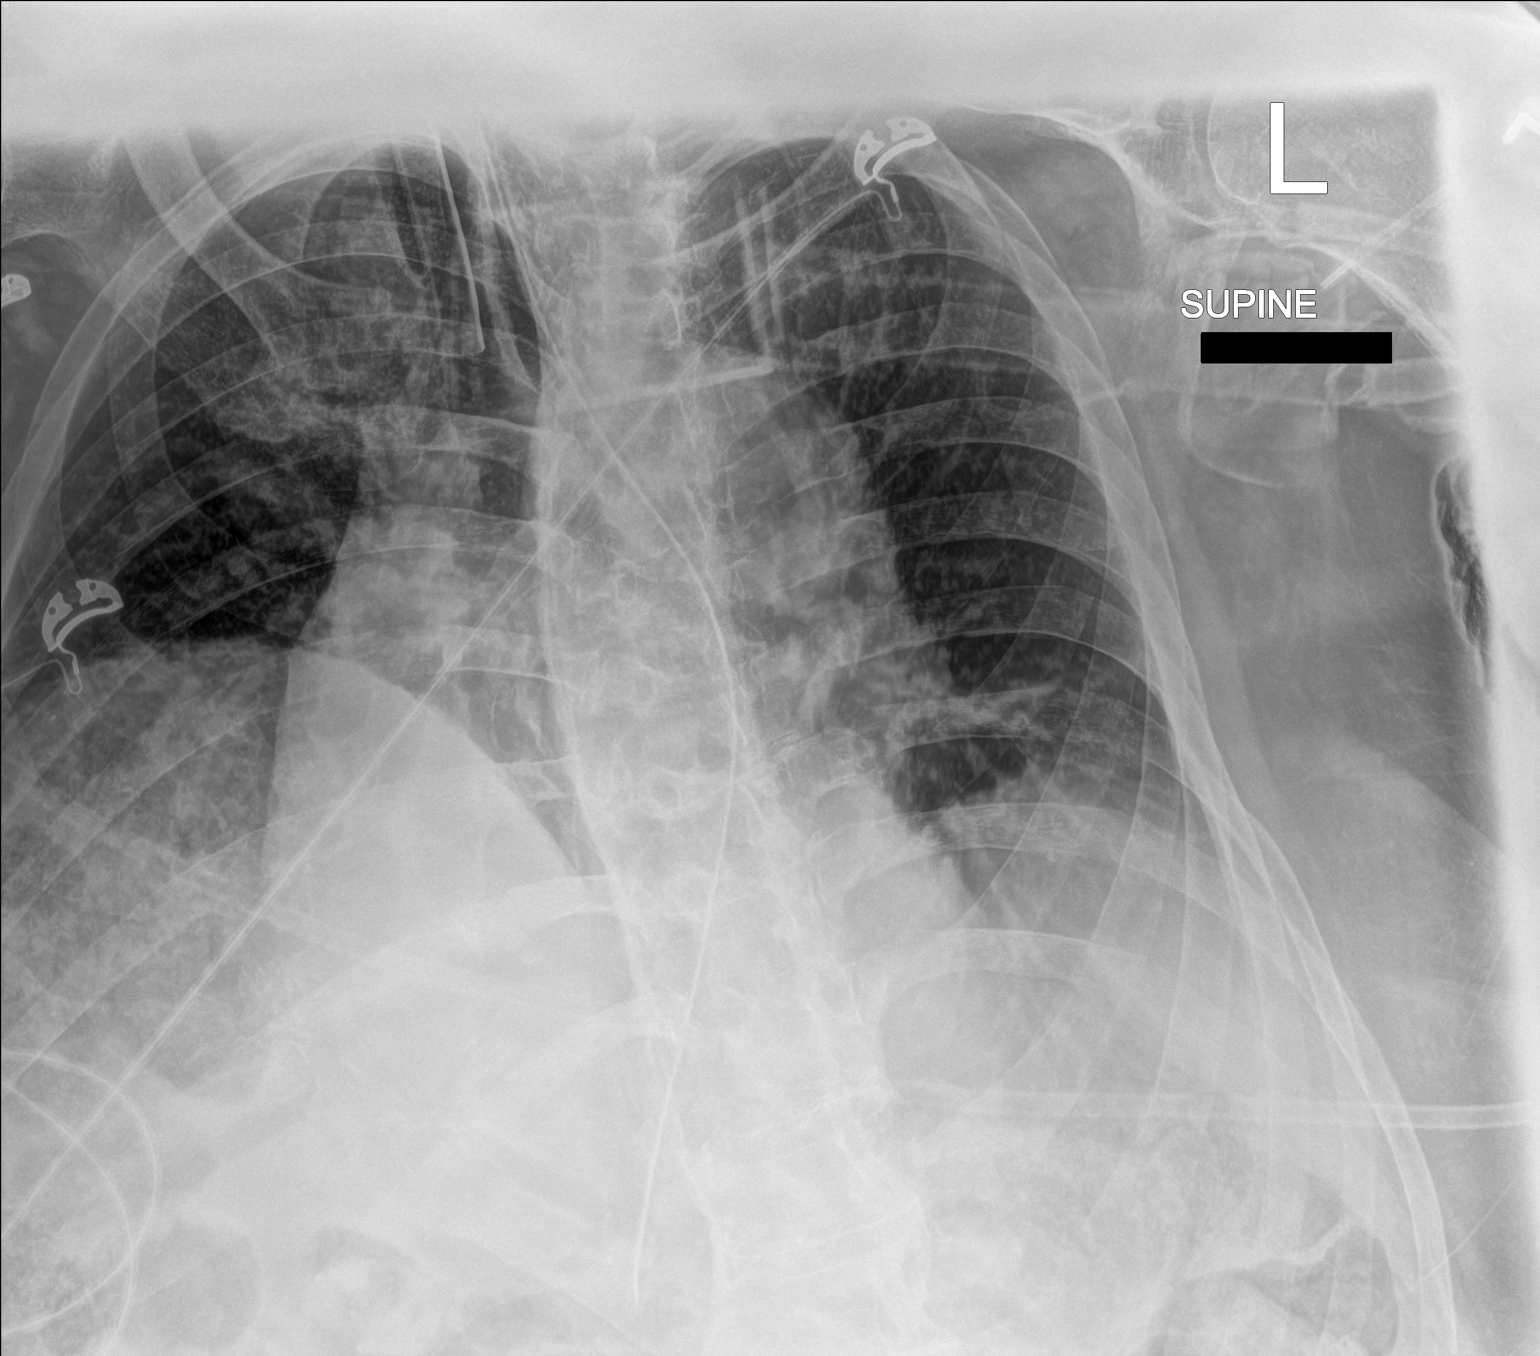

[1 of 1 positions shown; findings below may reference images not displayed]

FINDINGS: Tip of the enteric tube is below the diaphragm, the side port is in
the region of the gastroesophageal junction. Patient has history of
gastric bypass. Stool-filled colon interposed under the right
hemidiaphragm. No evidence of free air.
IMPRESSION: Tip of the enteric tube below the diaphragm, side port in the region
of the gastroesophageal junction. This is likely appropriately
position in this patient who is post gastric bypass.

## 2021-10-18 IMAGING — CT CT HEAD W/O CM
4 of 5 series · 15 of 47 positions shown, 17 images · non-contrast
Comparison: None.

CLINICAL DATA: 74-year-old male with altered mental status.

EXAM:
CT HEAD WITHOUT CONTRAST
TECHNIQUE: Contiguous axial images were obtained from the base of the skull
through the vertex without intravenous contrast.

[Series 3: head wo · axial · 0.44mm/px · z∈[-176,-66]mm · 5 of 34 slices shown, 7 images (1 of 2)]
[im 6/34  brain]
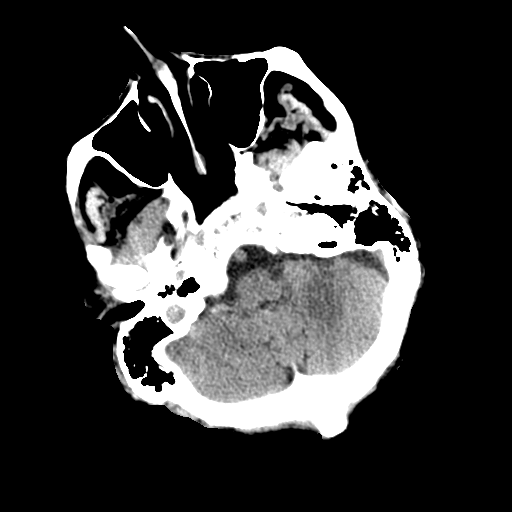
[im 6/34  bone]
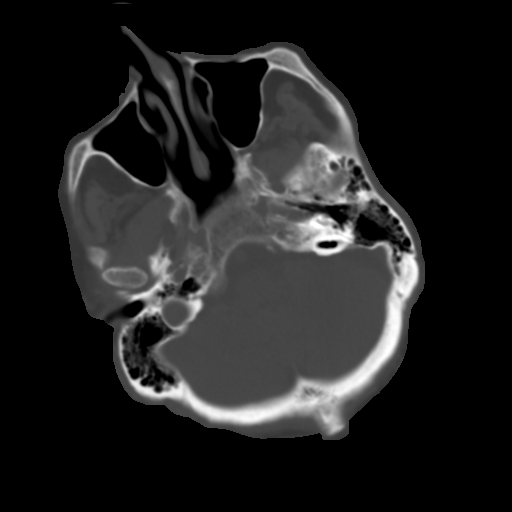
[im 12/34  brain]
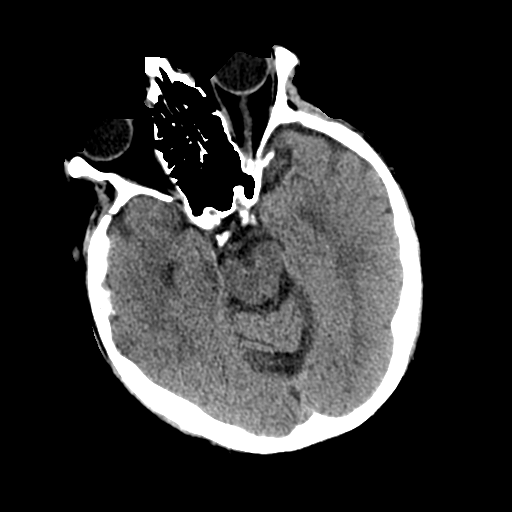
[im 17/34  brain]
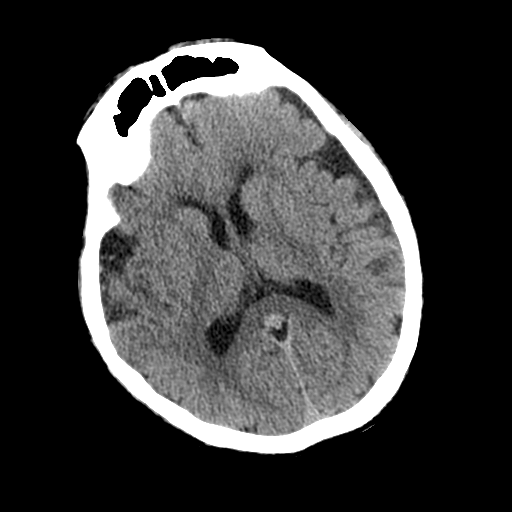
[im 23/34  brain]
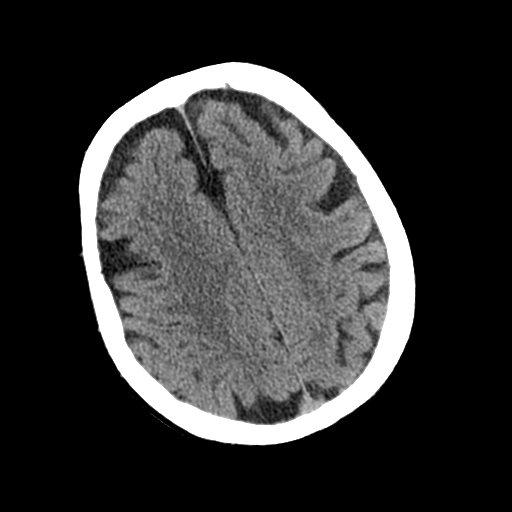
[im 28/34  brain]
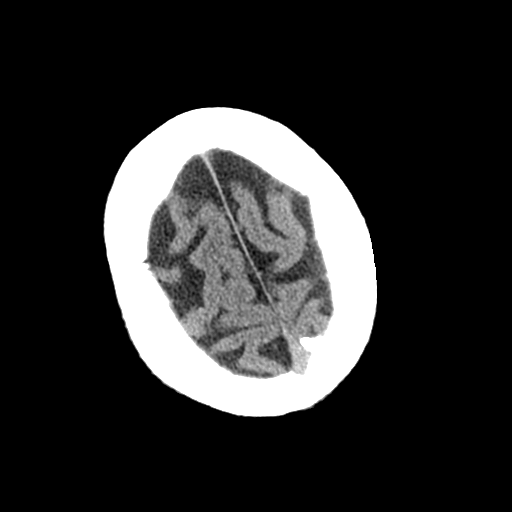
[im 28/34  bone]
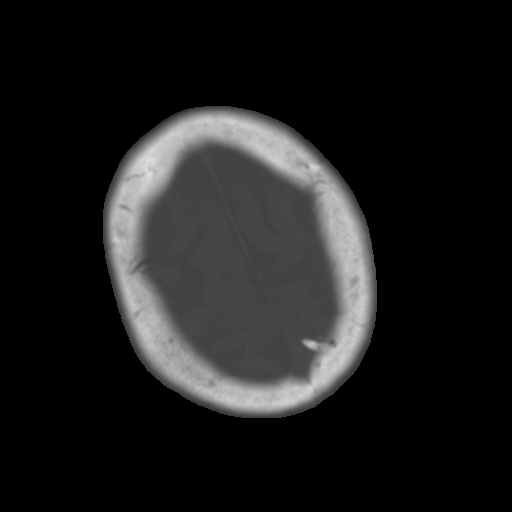

[Series 4: head wo · axial · 0.34mm/px · z∈[-102,-24]mm · 4 of 29 slices shown (2 of 2)]
[im 6/29  brain]
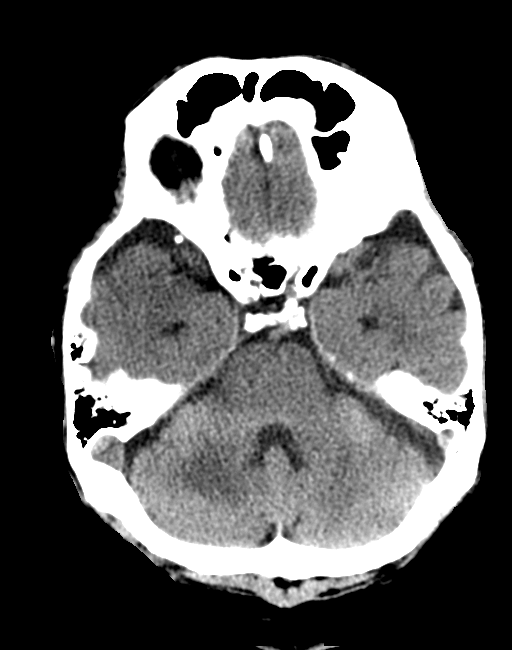
[im 12/29  brain]
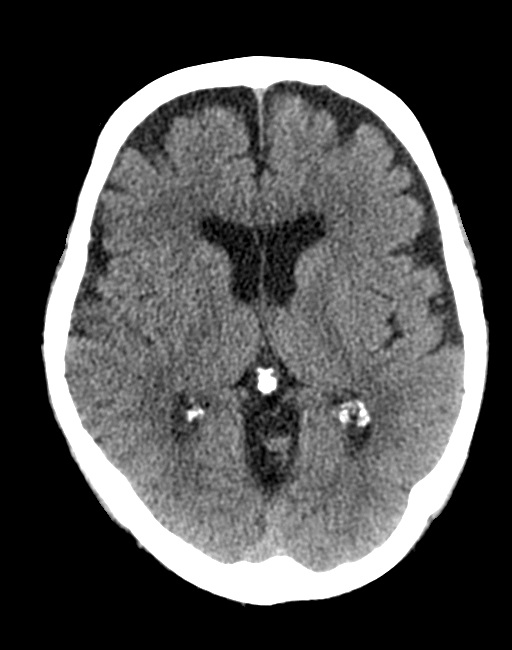
[im 17/29  brain]
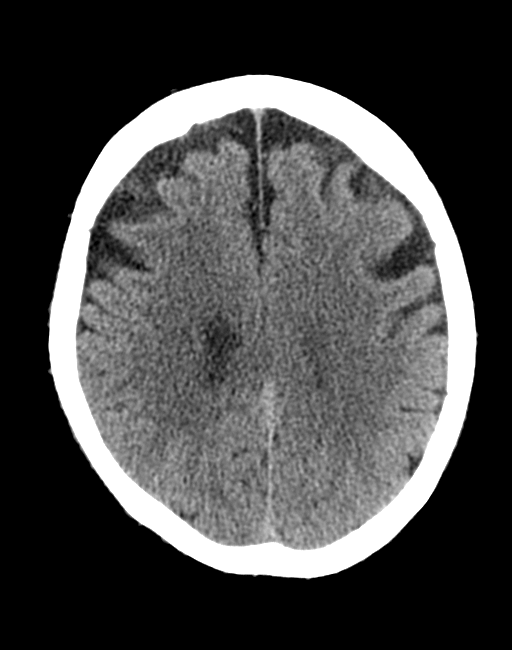
[im 23/29  brain]
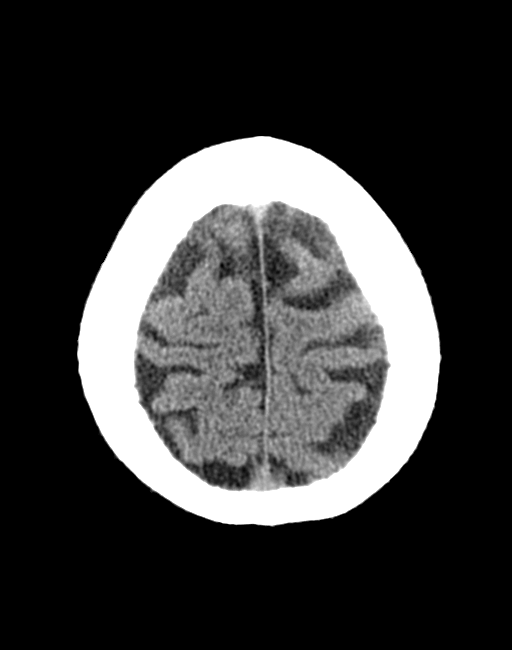

[Series 6: coronal soft tissue · coronal · 0.33mm/px · 3 of 68 slices shown]
[im 23/68  brain]
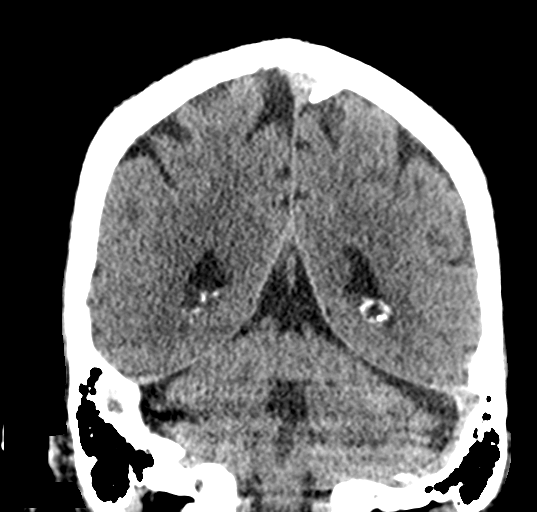
[im 30/68  brain]
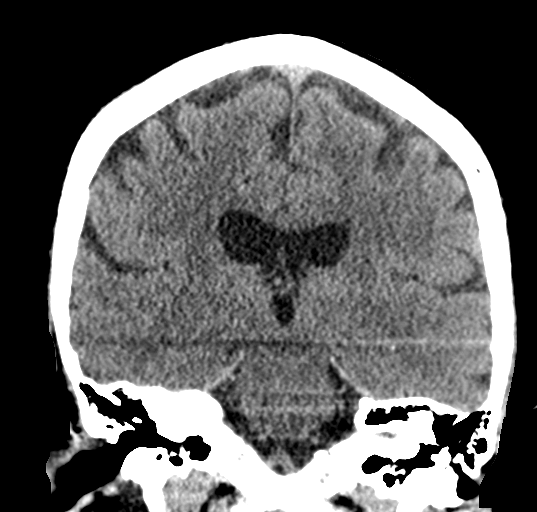
[im 38/68  brain]
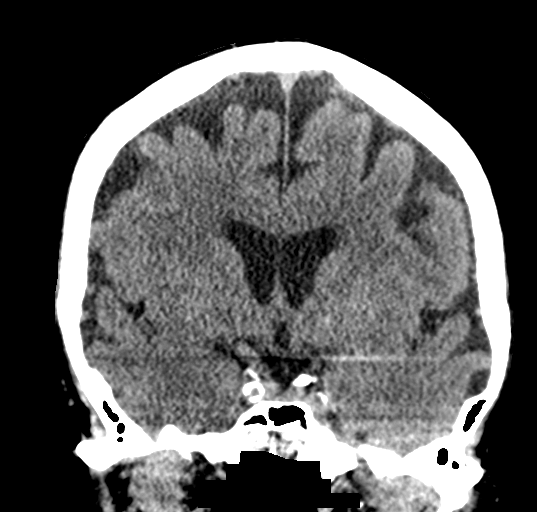

[Series 7: sagittal soft tissue · sagittal · 0.33mm/px · 3 of 56 slices shown]
[im 19/56  brain]
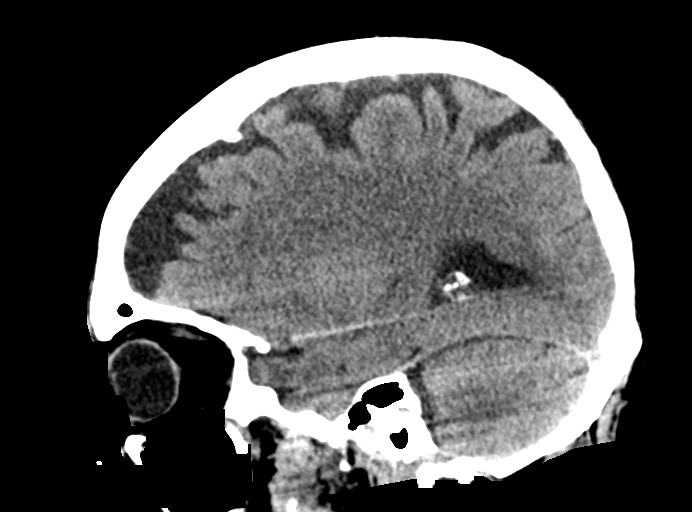
[im 28/56  brain]
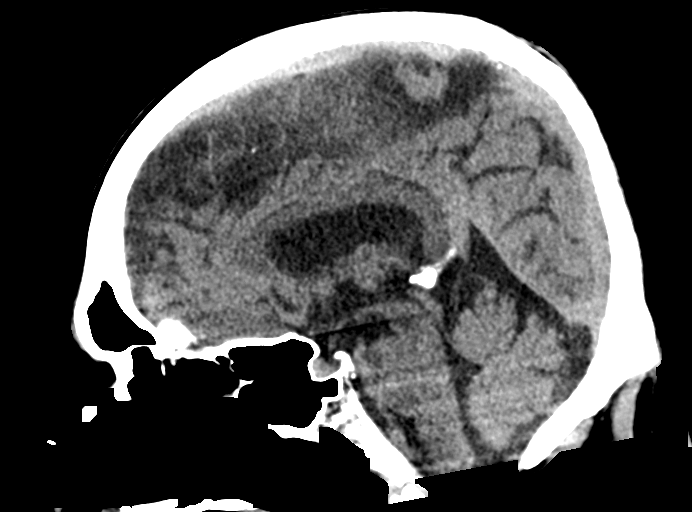
[im 37/56  brain]
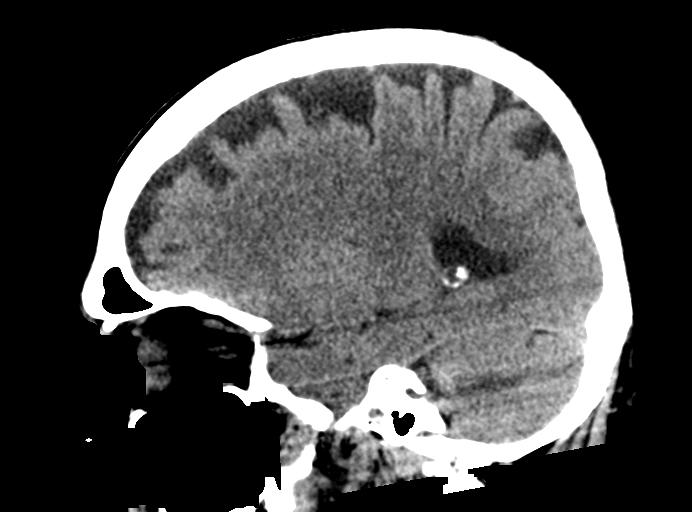

[15 of 47 positions shown; findings below may reference images not displayed]

FINDINGS: Brain: There is moderate age-related atrophy and chronic
microvascular ischemic changes. There is no acute intracranial
hemorrhage. No mass effect midline shift. No extra-axial fluid
collection. A subcentimeter focus of calcification along the inner
table of the left frontal calvarium may represent a small
meningioma.

Vascular: No hyperdense vessel or unexpected calcification.

Skull: Normal. Negative for fracture or focal lesion.

Sinuses/Orbits: No acute finding.

Other: None
IMPRESSION: 1. No acute intracranial pathology.
2. Moderate age-related atrophy and chronic microvascular ischemic
changes.
